# Patient Record
Sex: Female | Born: 1975 | Race: White | Hispanic: No | Marital: Married | Smoking: Never smoker
Health system: Southern US, Community
[De-identification: ages and names within clinical notes are randomized; demographics above are authoritative.]

## PROBLEM LIST (undated history)

## (undated) DIAGNOSIS — G4700 Insomnia, unspecified: Secondary | ICD-10-CM

## (undated) DIAGNOSIS — M1990 Unspecified osteoarthritis, unspecified site: Secondary | ICD-10-CM

## (undated) DIAGNOSIS — R519 Headache, unspecified: Secondary | ICD-10-CM

## (undated) DIAGNOSIS — D649 Anemia, unspecified: Secondary | ICD-10-CM

## (undated) DIAGNOSIS — M5432 Sciatica, left side: Secondary | ICD-10-CM

## (undated) DIAGNOSIS — R011 Cardiac murmur, unspecified: Secondary | ICD-10-CM

## (undated) DIAGNOSIS — R51 Headache: Secondary | ICD-10-CM

## (undated) DIAGNOSIS — R569 Unspecified convulsions: Secondary | ICD-10-CM

## (undated) DIAGNOSIS — M549 Dorsalgia, unspecified: Secondary | ICD-10-CM

## (undated) DIAGNOSIS — K219 Gastro-esophageal reflux disease without esophagitis: Secondary | ICD-10-CM

## (undated) HISTORY — DX: Gastro-esophageal reflux disease without esophagitis: K21.9

## (undated) HISTORY — DX: Sciatica, left side: M54.32

## (undated) HISTORY — DX: Dorsalgia, unspecified: M54.9

## (undated) HISTORY — PX: GANGLION CYST EXCISION: SHX1691

## (undated) HISTORY — PX: TUBAL LIGATION: SHX77

---

## 2011-06-16 ENCOUNTER — Ambulatory Visit (INDEPENDENT_AMBULATORY_CARE_PROVIDER_SITE_OTHER): Payer: 59 | Admitting: Family Medicine

## 2011-06-16 ENCOUNTER — Encounter: Payer: Self-pay | Admitting: Family Medicine

## 2011-06-16 VITALS — BP 120/80 | HR 96 | Temp 98.6°F | Ht 61.0 in | Wt 164.5 lb

## 2011-06-16 DIAGNOSIS — G8929 Other chronic pain: Secondary | ICD-10-CM

## 2011-06-16 DIAGNOSIS — M549 Dorsalgia, unspecified: Secondary | ICD-10-CM

## 2011-06-16 DIAGNOSIS — M5416 Radiculopathy, lumbar region: Secondary | ICD-10-CM | POA: Insufficient documentation

## 2011-06-16 MED ORDER — OXYCODONE-ACETAMINOPHEN 5-325 MG PO TABS
1.0000 | ORAL_TABLET | Freq: Three times a day (TID) | ORAL | Status: AC | PRN
Start: 2011-06-16 — End: 2011-06-26
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
## 2011-09-02 ENCOUNTER — Other Ambulatory Visit: Payer: Self-pay

## 2011-09-02 NOTE — Telephone Encounter (Signed)
Pt request written rx for Oxycodone APAP 5-325 mg for back pain. Pt last seen 06/16/11. Pt can be reached at [phone] when ready for pick up. Pt understands Dr Dayton Martes is not in the office this afternoon.

## 2011-09-03 ENCOUNTER — Other Ambulatory Visit: Payer: Self-pay

## 2011-09-03 MED ORDER — OXYCODONE-ACETAMINOPHEN 5-325 MG PO TABS: 1.0000 | ORAL_TABLET | Freq: Three times a day (TID) | ORAL | Status: DC | PRN

## 2011-09-03 NOTE — Telephone Encounter (Signed)
Pt picked up Roxicet rx #30 today but pt has not gotten filled yet. Pt said usually Dr Dayton Martes prescribes #90 and pt said if Dr Dayton Martes would write Roxicet rx for #90 she would bring the #30 rx back when she picks up new rx. Pt said she does not want to have to pick up a prescription every month. Pt can be reached at [phone].Please advise.

## 2011-09-03 NOTE — Telephone Encounter (Signed)
Rx for 90 printed out.

## 2011-09-03 NOTE — Telephone Encounter (Signed)
New script for # 90 placed up front, pt advised.  She is to bring back old script.

## 2011-09-03 NOTE — Telephone Encounter (Signed)
Left message advising pt script is ready for pick up, script placed up front. 

## 2011-11-26 ENCOUNTER — Other Ambulatory Visit: Payer: Self-pay

## 2011-11-26 MED ORDER — OXYCODONE-ACETAMINOPHEN 5-325 MG PO TABS: 1.0000 | ORAL_TABLET | Freq: Three times a day (TID) | ORAL | Status: DC | PRN

## 2011-11-26 NOTE — Telephone Encounter (Signed)
Left message advising pt script is ready for pick up, script placed up front.

## 2011-11-26 NOTE — Telephone Encounter (Signed)
Pt request 3 month rx for oxycodone. Call when ready for pick up .

## 2011-11-30 ENCOUNTER — Telehealth: Payer: Self-pay

## 2011-11-30 NOTE — Telephone Encounter (Signed)
Pt walked in the oxycodone rx cannot be filled at [facility]; spoke with Corrie Dandy at [facility] did not get shipment of Oxycodone in. Pt is out of med; Corrie Dandy said pt can pick up rx and take to [facility]. [facility] are not working. Pt will take to [facility] to be filled.

## 2012-01-12 ENCOUNTER — Telehealth: Payer: Self-pay

## 2012-01-12 NOTE — Telephone Encounter (Signed)
I would recommend Dramamine (OTC) 50 mg q 4-6 hours as needed. 

## 2012-01-12 NOTE — Telephone Encounter (Signed)
Advised patient

## 2012-01-12 NOTE — Telephone Encounter (Signed)
Pt going on cruise for 5 days request motion sickness pill not patch to [facility].Please advise.

## 2012-02-01 ENCOUNTER — Other Ambulatory Visit: Payer: Self-pay | Admitting: *Deleted

## 2012-02-01 MED ORDER — OXYCODONE-ACETAMINOPHEN 5-325 MG PO TABS: 1.0000 | ORAL_TABLET | Freq: Three times a day (TID) | ORAL | Status: DC | PRN

## 2012-02-01 NOTE — Telephone Encounter (Signed)
Advised patient script is ready for pick up, script placed at front desk. 

## 2012-02-01 NOTE — Telephone Encounter (Signed)
Patient called requesting a refill on oxycodone. Call when ready for pickup.

## 2012-03-24 ENCOUNTER — Encounter: Payer: 59 | Admitting: Family Medicine

## 2012-04-08 ENCOUNTER — Other Ambulatory Visit: Payer: Self-pay | Admitting: Family Medicine

## 2012-04-08 DIAGNOSIS — Z0000 Encounter for general adult medical examination without abnormal findings: Secondary | ICD-10-CM | POA: Insufficient documentation

## 2012-04-15 ENCOUNTER — Other Ambulatory Visit (INDEPENDENT_AMBULATORY_CARE_PROVIDER_SITE_OTHER): Payer: PRIVATE HEALTH INSURANCE

## 2012-04-15 ENCOUNTER — Other Ambulatory Visit: Payer: Self-pay

## 2012-04-15 DIAGNOSIS — Z0000 Encounter for general adult medical examination without abnormal findings: Secondary | ICD-10-CM

## 2012-04-15 LAB — COMPREHENSIVE METABOLIC PANEL
ALT: 14 U/L (ref 0–35)
AST: 18 U/L (ref 0–37)
Alkaline Phosphatase: 51 U/L (ref 39–117)
CO2: 27 mEq/L (ref 19–32)
Creatinine, Ser: 0.7 mg/dL (ref 0.4–1.2)
GFR: 95.03 mL/min (ref >60.00)
Sodium: 136 mEq/L (ref 135–145)
Total Bilirubin: 0.5 mg/dL (ref 0.3–1.2)
Total Protein: 7.6 g/dL (ref 6.0–8.3)

## 2012-04-15 LAB — LDL CHOLESTEROL, DIRECT: Direct LDL: 140.5 mg/dL

## 2012-04-15 LAB — LIPID PANEL
HDL: 37.2 mg/dL — ABNORMAL LOW (ref >39.00)
Total CHOL/HDL Ratio: 5
VLDL: 34.8 mg/dL (ref 0.0–40.0)

## 2012-04-15 MED ORDER — OXYCODONE-ACETAMINOPHEN 5-325 MG PO TABS: 1.0000 | ORAL_TABLET | Freq: Three times a day (TID) | ORAL | Status: DC | PRN

## 2012-04-15 NOTE — Telephone Encounter (Signed)
Advised patient script is ready for pick up. 

## 2012-04-15 NOTE — Telephone Encounter (Signed)
Pt left v/m requesting rx oxycodone; pt only has 2 pills.call when ready for pick up.

## 2012-04-21 ENCOUNTER — Encounter: Payer: Self-pay | Admitting: Family Medicine

## 2012-04-21 ENCOUNTER — Ambulatory Visit (INDEPENDENT_AMBULATORY_CARE_PROVIDER_SITE_OTHER): Payer: PRIVATE HEALTH INSURANCE | Admitting: Family Medicine

## 2012-04-21 VITALS — BP 120/84 | HR 72 | Temp 98.2°F | Ht 61.0 in | Wt 161.0 lb

## 2012-04-21 DIAGNOSIS — G8929 Other chronic pain: Secondary | ICD-10-CM

## 2012-04-21 DIAGNOSIS — M549 Dorsalgia, unspecified: Secondary | ICD-10-CM

## 2012-04-21 DIAGNOSIS — Z0000 Encounter for general adult medical examination without abnormal findings: Secondary | ICD-10-CM

## 2012-04-21 MED ORDER — GABAPENTIN 300 MG PO CAPS: 300.0000 mg | ORAL_CAPSULE | Freq: Three times a day (TID) | ORAL | Status: DC
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
## 2012-04-21 NOTE — Patient Instructions (Addendum)
We are adding Gabapentin- start out with 1 tablet at bedtime, may increase to 3 times daily.  Have a happy holiday.

## 2012-04-27 ENCOUNTER — Other Ambulatory Visit: Payer: Self-pay | Admitting: Family Medicine

## 2012-04-27 ENCOUNTER — Telehealth: Payer: Self-pay | Admitting: Family Medicine

## 2012-04-27 MED ORDER — PREGABALIN 50 MG PO CAPS: 50.0000 mg | ORAL_CAPSULE | Freq: Two times a day (BID) | ORAL | Status: DC

## 2012-04-27 NOTE — Telephone Encounter (Signed)
Pt came to office with her son and talked with Dr. Dayton Martes.

## 2012-04-27 NOTE — Telephone Encounter (Signed)
[facility] Triage Call Report Triage Record Num: [id number] Operator: Albertine Grates Patient Name: Jasmine [name] Call Date & Time: 04/26/2012 5:18:12PM Patient Phone: [phone] PCP: Ruthe Mannan Patient Gender: Female PCP Fax : [phone] Patient DOB: 13-Oct-1975 Practice Name: [facility] Reason for Call: Caller: Aidyn/Patient; PCP: Ruthe Mannan (Family Practice); CB#: [phone]; States has started taking Gabapentin 300mg  twice daily since 11-15. Does not "feel right" 11-19. Has headache and feels "nauseated". Eyes are burning. Afebrile. LMP "3 months ago" due to birth control. Has taken Aleve but has not helped. Rates "8-9" on scale 1-10. States is pounding. Advised ED due to severe headache but refuses. States if headache worsens will be seen in office 11-20. States is not taking any more gabapentin nor is receptive to another medicine to replace. Will follow up with PCP. Protocol(s) Used: Headache Recommended Outcome per Protocol: See Provider within 4 hours Reason for Outcome: New onset of severe headache unrelieved with 4 hours of home care Care Advice: ~ 04/26/2012 5:28:32PM Page 1 of 1 [email or web address]

## 2012-04-27 NOTE — Telephone Encounter (Signed)
Please call pt to check on her

## 2012-06-13 ENCOUNTER — Other Ambulatory Visit: Payer: Self-pay

## 2012-06-13 MED ORDER — OXYCODONE-ACETAMINOPHEN 5-325 MG PO TABS: 1.0000 | ORAL_TABLET | Freq: Three times a day (TID) | ORAL | Status: DC | PRN

## 2012-06-13 NOTE — Telephone Encounter (Signed)
Pt left v/m requesting rx oxycodone. Call when ready for pick up. 

## 2012-06-14 NOTE — Telephone Encounter (Signed)
Left message on home voice mail advising pt that script is ready for pick up.

## 2012-08-05 ENCOUNTER — Other Ambulatory Visit: Payer: Self-pay

## 2012-08-05 MED ORDER — OXYCODONE-ACETAMINOPHEN 5-325 MG PO TABS: 1.0000 | ORAL_TABLET | Freq: Three times a day (TID) | ORAL | Status: DC | PRN

## 2012-08-05 NOTE — Telephone Encounter (Signed)
Pt request rx oxycodone apap.call when rx ready for pick up. Pt does not have enough med to last until 08/312 and request call back today.Please advise.

## 2012-08-05 NOTE — Telephone Encounter (Signed)
Printed  Please give to patient

## 2012-08-05 NOTE — Telephone Encounter (Signed)
Patient advised.  Rx left at front desk for pick up. 

## 2012-09-20 ENCOUNTER — Other Ambulatory Visit: Payer: Self-pay

## 2012-09-20 MED ORDER — OXYCODONE-ACETAMINOPHEN 5-325 MG PO TABS: 1.0000 | ORAL_TABLET | Freq: Three times a day (TID) | ORAL | Status: DC | PRN

## 2012-09-20 NOTE — Telephone Encounter (Signed)
Pt left v/m requesting rx oxycodone apap. Call when ready for pick up. 

## 2012-09-20 NOTE — Telephone Encounter (Signed)
Advised patient script is ready for pick up, placed at front desk. 

## 2012-11-01 ENCOUNTER — Other Ambulatory Visit: Payer: Self-pay

## 2012-11-01 ENCOUNTER — Encounter: Payer: Self-pay | Admitting: Family Medicine

## 2012-11-01 MED ORDER — OXYCODONE-ACETAMINOPHEN 5-325 MG PO TABS: 1.0000 | ORAL_TABLET | Freq: Three times a day (TID) | ORAL | Status: DC | PRN

## 2012-11-01 NOTE — Telephone Encounter (Signed)
Advised patient script is ready for pick up at front desk. 

## 2012-11-01 NOTE — Telephone Encounter (Signed)
Pt left v/m requesting rx oxycodone. Pt is out of med and request pick up rx today.Please advise.

## 2012-11-14 ENCOUNTER — Encounter: Payer: Self-pay | Admitting: Family Medicine

## 2012-12-12 ENCOUNTER — Other Ambulatory Visit: Payer: Self-pay

## 2012-12-12 MED ORDER — OXYCODONE-ACETAMINOPHEN 5-325 MG PO TABS: 1.0000 | ORAL_TABLET | Freq: Three times a day (TID) | ORAL | Status: DC | PRN

## 2012-12-12 NOTE — Telephone Encounter (Signed)
When was it last filled? 

## 2012-12-12 NOTE — Telephone Encounter (Signed)
Advised patient script is ready for pick up. 

## 2012-12-12 NOTE — Telephone Encounter (Signed)
Pt request rx oxycodone apap for back,neck and leg pain. Pt request cb when ready for pickup.

## 2012-12-12 NOTE — Telephone Encounter (Signed)
Looks like 11/01/12.  Script on your desk for signature.

## 2013-01-10 ENCOUNTER — Other Ambulatory Visit: Payer: Self-pay

## 2013-01-10 MED ORDER — OXYCODONE-ACETAMINOPHEN 5-325 MG PO TABS: 1.0000 | ORAL_TABLET | Freq: Three times a day (TID) | ORAL | Status: DC | PRN

## 2013-01-10 NOTE — Telephone Encounter (Signed)
Left voicemail letting pt know Rx ready for pick up 

## 2013-01-10 NOTE — Telephone Encounter (Signed)
Pt left note requesting rx oxycodone apap. Call when ready for pick up. Pt will be leaving on vacation 01/13/13 -01/26/13 and will not have enough med for trip.

## 2013-01-10 NOTE — Telephone Encounter (Signed)
Px printed for pick up in IN box  

## 2013-02-13 ENCOUNTER — Telehealth: Payer: Self-pay

## 2013-02-13 ENCOUNTER — Encounter: Payer: Self-pay | Admitting: Internal Medicine

## 2013-02-13 ENCOUNTER — Ambulatory Visit (INDEPENDENT_AMBULATORY_CARE_PROVIDER_SITE_OTHER): Payer: PRIVATE HEALTH INSURANCE | Admitting: Internal Medicine

## 2013-02-13 VITALS — BP 100/70 | HR 78 | Temp 98.0°F | Wt 159.0 lb

## 2013-02-13 DIAGNOSIS — IMO0002 Reserved for concepts with insufficient information to code with codable children: Secondary | ICD-10-CM

## 2013-02-13 DIAGNOSIS — M792 Neuralgia and neuritis, unspecified: Secondary | ICD-10-CM | POA: Insufficient documentation

## 2013-02-13 MED ORDER — PREDNISONE 20 MG PO TABS: 40.0000 mg | ORAL_TABLET | Freq: Every day | ORAL | Status: DC

## 2013-02-13 MED ORDER — CYCLOBENZAPRINE HCL 10 MG PO TABS: 5.0000 mg | ORAL_TABLET | Freq: Three times a day (TID) | ORAL | Status: DC | PRN

## 2013-02-13 NOTE — Assessment & Plan Note (Signed)
Fortunately no weakness Wants to avoid surgery Will try course of prednisone Cyclobenzaprine (esp for night) May need MRI if worsens

## 2013-02-13 NOTE — Telephone Encounter (Signed)
Will evaluate at [facility] 
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
## 2013-02-13 NOTE — Telephone Encounter (Signed)
Pt said has hx of neck problems; for 2 weeks pt has had pain in neck muscle; this weekend pain has worsened and having pain in rt shoulder with numbness and tingling in rt fingers. Pain level now is 8-9. Pt scheduled appt with Dr Alphonsus Sias today at 12 noon. Dr Dayton Martes had no available appts.

## 2013-02-21 ENCOUNTER — Other Ambulatory Visit: Payer: Self-pay

## 2013-02-21 MED ORDER — OXYCODONE-ACETAMINOPHEN 5-325 MG PO TABS: 1.0000 | ORAL_TABLET | Freq: Three times a day (TID) | ORAL | Status: DC | PRN

## 2013-02-21 NOTE — Telephone Encounter (Signed)
Advised patient script is ready for pick up. 

## 2013-02-21 NOTE — Telephone Encounter (Signed)
Pt request rx oxycodone apap. Call when ready for pick up. 

## 2013-03-13 ENCOUNTER — Encounter: Payer: Self-pay | Admitting: Family Medicine

## 2013-03-13 ENCOUNTER — Ambulatory Visit (INDEPENDENT_AMBULATORY_CARE_PROVIDER_SITE_OTHER): Payer: BC Managed Care – PPO | Admitting: Family Medicine

## 2013-03-13 VITALS — BP 140/94 | HR 79 | Temp 98.0°F | Wt 163.0 lb

## 2013-03-13 DIAGNOSIS — F4323 Adjustment disorder with mixed anxiety and depressed mood: Secondary | ICD-10-CM

## 2013-03-13 DIAGNOSIS — G8929 Other chronic pain: Secondary | ICD-10-CM

## 2013-03-13 DIAGNOSIS — M549 Dorsalgia, unspecified: Secondary | ICD-10-CM

## 2013-03-13 MED ORDER — PREGABALIN 50 MG PO CAPS: 50.0000 mg | ORAL_CAPSULE | Freq: Two times a day (BID) | ORAL | Status: DC
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
## 2013-03-13 NOTE — Patient Instructions (Signed)
Good to see you. Please stop by to see Shirlee Limerick on your way out to discuss an appointment with Terri(social worker/therapist) for you and your daughter.

## 2013-03-14 ENCOUNTER — Telehealth: Payer: Self-pay | Admitting: Family Medicine

## 2013-03-14 MED ORDER — GABAPENTIN 300 MG PO CAPS: 300.0000 mg | ORAL_CAPSULE | Freq: Three times a day (TID) | ORAL | Status: DC

## 2013-03-14 NOTE — Telephone Encounter (Signed)
Yes I would taper off of lyrica- 1 tablet every other day for 2 weeks and stop.  I will refill Gabapentin.

## 2013-03-14 NOTE — Telephone Encounter (Signed)
Pt left message stating that her insurance no longer covers Lyrica and wants to switch back to Gabapentin.  She wants to know if she needs to taper off of the Lyrica or if she can go ahead and stop taking it and start the Gabapentin.

## 2013-03-14 NOTE — Telephone Encounter (Signed)
She can start the gabapentin right away and take a lyrica every other day until she runs outs.

## 2013-03-14 NOTE — Telephone Encounter (Signed)
Pt says she only has 8 pills left (cannot get anymore bc of insurance) and is worried about not having something to take daily.

## 2013-03-14 NOTE — Addendum Note (Signed)
Addended by: Dianne Dun on: 03/14/2013 03:17 PM   Modules accepted: Orders

## 2013-03-15 NOTE — Telephone Encounter (Signed)
Pt says that the last time she took Gabapentin, she was taking 2/day and felt like she was going to have a seizure.  She does not want to take 3/day.  Encouraged pt to try 1/day with the Lyrica every other day and then gradually increase Gabapentin as tolerated.  Instructed pt to call us with any issues.

## 2013-03-15 NOTE — Telephone Encounter (Signed)
Yes I agree with instructions given.  Thank you.

## 2013-03-28 ENCOUNTER — Other Ambulatory Visit: Payer: Self-pay

## 2013-03-28 NOTE — Telephone Encounter (Signed)
Please route to [employer] after approval.

## 2013-03-28 NOTE — Telephone Encounter (Signed)
This needs to be done by one of the [facility] doctors because I am not there to sign it

## 2013-03-28 NOTE — Telephone Encounter (Signed)
Pt left v/m requesting rx oxycodone apap. Call when ready for pick up. 

## 2013-03-30 ENCOUNTER — Encounter: Payer: Self-pay | Admitting: Family Medicine

## 2013-03-30 MED ORDER — OXYCODONE-ACETAMINOPHEN 5-325 MG PO TABS: 1.0000 | ORAL_TABLET | Freq: Three times a day (TID) | ORAL | Status: DC | PRN

## 2013-03-30 NOTE — Addendum Note (Signed)
Addended by: Kerby Nora E on: 03/30/2013 08:36 AM   Modules accepted: Orders

## 2013-03-30 NOTE — Telephone Encounter (Signed)
Can you please sign and print for Dr. Dayton Martes.  Route to [place].  Thanks.

## 2013-03-30 NOTE — Telephone Encounter (Signed)
Pt called and is out of medicine and needs to pick up rx today.

## 2013-03-30 NOTE — Telephone Encounter (Signed)
On printer box.

## 2013-03-30 NOTE — Telephone Encounter (Signed)
[name] notified prescription is ready to be picked up at front desk.

## 2013-04-11 ENCOUNTER — Ambulatory Visit (INDEPENDENT_AMBULATORY_CARE_PROVIDER_SITE_OTHER): Payer: BC Managed Care – PPO | Admitting: Internal Medicine

## 2013-04-11 ENCOUNTER — Encounter: Payer: Self-pay | Admitting: Internal Medicine

## 2013-04-11 ENCOUNTER — Encounter: Payer: PRIVATE HEALTH INSURANCE | Admitting: Family Medicine

## 2013-04-11 VITALS — BP 110/74 | HR 87 | Temp 98.4°F | Ht 61.0 in | Wt 162.5 lb

## 2013-04-11 DIAGNOSIS — N951 Menopausal and female climacteric states: Secondary | ICD-10-CM

## 2013-04-11 DIAGNOSIS — Z13220 Encounter for screening for lipoid disorders: Secondary | ICD-10-CM

## 2013-04-11 DIAGNOSIS — Z23 Encounter for immunization: Secondary | ICD-10-CM

## 2013-04-11 DIAGNOSIS — R232 Flushing: Secondary | ICD-10-CM

## 2013-04-11 DIAGNOSIS — Z0000 Encounter for general adult medical examination without abnormal findings: Secondary | ICD-10-CM

## 2013-04-11 LAB — TSH: TSH: 0.53 u[IU]/mL (ref 0.35–5.50)

## 2013-04-11 LAB — COMPREHENSIVE METABOLIC PANEL
ALT: 20 U/L (ref 0–35)
AST: 24 U/L (ref 0–37)
BUN: 19 mg/dL (ref 6–23)
CO2: 28 mEq/L (ref 19–32)
Calcium: 9.3 mg/dL (ref 8.4–10.5)
Chloride: 100 mEq/L (ref 96–112)
Creatinine, Ser: 0.6 mg/dL (ref 0.4–1.2)
GFR: 110.03 mL/min (ref >60.00)
Sodium: 136 mEq/L (ref 135–145)
Total Protein: 7.5 g/dL (ref 6.0–8.3)

## 2013-04-11 LAB — CBC
Hemoglobin: 12.8 g/dL (ref 12.0–15.0)
MCHC: 33.8 g/dL (ref 30.0–36.0)
RBC: 4.52 Mil/uL (ref 3.87–5.11)
RDW: 13.7 % (ref 11.5–14.6)
WBC: 10.9 10*3/uL — ABNORMAL HIGH (ref 4.5–10.5)

## 2013-04-11 LAB — LIPID PANEL
HDL: 51.1 mg/dL (ref >39.00)
Total CHOL/HDL Ratio: 3
VLDL: 38.4 mg/dL (ref 0.0–40.0)

## 2013-04-11 NOTE — Addendum Note (Signed)
Addended by: Criselda Peaches B on: 04/11/2013 03:10 PM   Modules accepted: Orders
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
## 2013-04-11 NOTE — Patient Instructions (Signed)

## 2013-04-14 ENCOUNTER — Telehealth: Payer: Self-pay

## 2013-04-14 NOTE — Telephone Encounter (Signed)
Pt returning call and request cb. 

## 2013-04-17 ENCOUNTER — Telehealth: Payer: Self-pay | Admitting: Family Medicine

## 2013-04-17 DIAGNOSIS — Z0279 Encounter for issue of other medical certificate: Secondary | ICD-10-CM

## 2013-04-17 NOTE — Telephone Encounter (Signed)
Patient advised that form is ready for pick up. 

## 2013-04-17 NOTE — Telephone Encounter (Signed)
Pt dropped off a physical form for insurances purposes to be completed.

## 2013-04-17 NOTE — Telephone Encounter (Signed)
Spoke with patient about labs 

## 2013-04-17 NOTE — Telephone Encounter (Signed)
Done and given back to [employer]

## 2013-04-17 NOTE — Telephone Encounter (Signed)
Form is in your in box

## 2013-04-18 ENCOUNTER — Other Ambulatory Visit: Payer: Self-pay

## 2013-04-18 NOTE — Telephone Encounter (Signed)
Pt left message requesting refill oxycodone apap and lyrica. Call when ready for pick up.

## 2013-04-21 ENCOUNTER — Other Ambulatory Visit: Payer: Self-pay | Admitting: *Deleted

## 2013-04-21 MED ORDER — PREGABALIN 50 MG PO CAPS: 50.0000 mg | ORAL_CAPSULE | Freq: Two times a day (BID) | ORAL | Status: DC

## 2013-04-21 NOTE — Telephone Encounter (Signed)
Last filled 03/13/2013, ok to fill?

## 2013-04-21 NOTE — Telephone Encounter (Signed)
rx called into pharmacy

## 2013-04-21 NOTE — Telephone Encounter (Signed)
Ok to phone in.

## 2013-05-08 ENCOUNTER — Other Ambulatory Visit: Payer: Self-pay

## 2013-05-08 MED ORDER — OXYCODONE-ACETAMINOPHEN 5-325 MG PO TABS: 1.0000 | ORAL_TABLET | Freq: Three times a day (TID) | ORAL | Status: DC | PRN

## 2013-05-08 NOTE — Telephone Encounter (Signed)
Pt left v/m requesting rx oxycodone apap. Call when ready for pick up. 

## 2013-05-08 NOTE — Telephone Encounter (Signed)
Spoke with patient and advised rx ready for pick-up and it will be at the front desk.  

## 2013-05-22 ENCOUNTER — Other Ambulatory Visit: Payer: Self-pay

## 2013-05-22 MED ORDER — PREGABALIN 50 MG PO CAPS: 50.0000 mg | ORAL_CAPSULE | Freq: Two times a day (BID) | ORAL | Status: DC

## 2013-05-22 NOTE — Telephone Encounter (Signed)
Ok to phone in lyrica

## 2013-05-22 NOTE — Telephone Encounter (Signed)
Pt left v/m requesting lyrica refilled to [facility]. Pt request cb when refilled.

## 2013-05-22 NOTE — Telephone Encounter (Signed)
Rx called into [facility] pharmacy as requested

## 2013-06-05 ENCOUNTER — Telehealth: Payer: Self-pay

## 2013-06-05 ENCOUNTER — Other Ambulatory Visit: Payer: Self-pay

## 2013-06-05 MED ORDER — OXYCODONE-ACETAMINOPHEN 5-325 MG PO TABS: 1.0000 | ORAL_TABLET | Freq: Three times a day (TID) | ORAL | Status: DC | PRN

## 2013-06-05 NOTE — Telephone Encounter (Signed)
Rx left in front office for pick up and pt is aware  

## 2013-06-05 NOTE — Telephone Encounter (Signed)
Pt left v/m requesting rx oxycodone apap. Call when ready for pick up. 

## 2013-06-27 ENCOUNTER — Telehealth: Payer: Self-pay

## 2013-06-27 MED ORDER — OXYCODONE-ACETAMINOPHEN 5-325 MG PO TABS: 1.0000 | ORAL_TABLET | Freq: Three times a day (TID) | ORAL | Status: DC | PRN

## 2013-06-27 MED ORDER — PREGABALIN 50 MG PO CAPS: 50.0000 mg | ORAL_CAPSULE | Freq: Two times a day (BID) | ORAL | Status: DC

## 2013-06-27 NOTE — Telephone Encounter (Signed)
Pt request rx oxycodone apap and Lyrica. Call when ready for pick up. Pt said she only has couple of oxycodone left; pt said she never takes more than 3 a day and usually takes 2 a day. Advised pt last filled 06/05/14; pt said she would start counting when gets from pharmacy to make sure getting correct amt.Please advise.

## 2013-06-27 NOTE — Telephone Encounter (Signed)
Tim Teacher, early years[facility]ac[facility] in ox[facility]one rx; pt continues to get rx early. Pt got oxycodone on 05/08/13, 06/06/13 and now 06/27/13. Tim does not feel comfortable filling rx; wants Dr Elmer SowAron's input,.

## 2013-06-27 NOTE — Telephone Encounter (Signed)
pts daughter had appt today and received Rx at that time

## 2013-06-28 ENCOUNTER — Telehealth: Payer: Self-pay | Admitting: *Deleted

## 2013-06-28 NOTE — Telephone Encounter (Signed)
Agreed and please thank Tim for calling.  Please hold rx until it's time to fill and make note in chart.

## 2013-06-28 NOTE — Telephone Encounter (Signed)
[facility] contacted office in regards to refilling pts Rx for oxycodone. Pt recently had refill on 06/06/13 and he did not feel comfortable filling it at this time. Per Dr Dayton MartesAron, I left a message for Tim, as he is out today, requesting medication be held until refill is due.

## 2013-07-03 ENCOUNTER — Ambulatory Visit (INDEPENDENT_AMBULATORY_CARE_PROVIDER_SITE_OTHER): Payer: BC Managed Care – PPO | Admitting: Internal Medicine

## 2013-07-03 ENCOUNTER — Encounter: Payer: Self-pay | Admitting: Internal Medicine

## 2013-07-03 VITALS — BP 122/76 | HR 86 | Temp 99.1°F | Wt 152.5 lb

## 2013-07-03 DIAGNOSIS — J069 Acute upper respiratory infection, unspecified: Secondary | ICD-10-CM

## 2013-07-03 MED ORDER — AZITHROMYCIN 250 MG PO TABS: ORAL_TABLET | ORAL | Status: DC

## 2013-07-03 NOTE — Progress Notes (Signed)
Pre-visit discussion using our clinic review tool. No additional management support is needed unless otherwise documented below in the visit note.  

## 2013-07-03 NOTE — Patient Instructions (Signed)
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
## 2013-07-07 ENCOUNTER — Encounter: Payer: Self-pay | Admitting: Family Medicine

## 2013-07-07 MED ORDER — OXYCODONE-ACETAMINOPHEN 5-325 MG PO TABS: 1.0000 | ORAL_TABLET | Freq: Three times a day (TID) | ORAL | Status: DC | PRN

## 2013-07-07 NOTE — Telephone Encounter (Signed)
Error

## 2013-07-07 NOTE — Telephone Encounter (Signed)
Spoke to pt and informed her Rx has been reprinted and will be available for pickup at the front desk; informed a gov't issued photo id required for pickup

## 2013-07-07 NOTE — Addendum Note (Signed)
Addended by: Desmond DikeKNIGHT, Smaran Gaus H on: 07/07/2013 10:16 AM   Modules accepted: Orders

## 2013-07-07 NOTE — Telephone Encounter (Signed)
Noted.  Ok to reprint rx and put in my box.

## 2013-07-07 NOTE — Telephone Encounter (Signed)
[facility] called and said that [facility] could not fill her Oxycodone RX a few days ago because it was too early.  She said that they kept the RX and when she called today to have it filled (which wast the date they told her it would be ok to fill), they told her that they did not have the RX and that they had given it back to her.  She said that they didn't give it back to her and now she's gone 4 days without her medication.  I [facility] [place] [facility] and she said that she thinks that Tim misunderstood the message below and discarded the RX.  I told her I wanted to make sure that pt did not have the printed RX and she said that she felt confident that the pt did not have it.

## 2013-07-31 ENCOUNTER — Other Ambulatory Visit: Payer: Self-pay

## 2013-07-31 MED ORDER — PREGABALIN 50 MG PO CAPS: 50.0000 mg | ORAL_CAPSULE | Freq: Two times a day (BID) | ORAL | Status: DC

## 2013-07-31 NOTE — Telephone Encounter (Signed)
Pt request refill lyrica to [facility].Please advise.

## 2013-07-31 NOTE — Telephone Encounter (Signed)
Spoke to pt and informed her Rx called in to requested pharmacy 

## 2013-08-08 ENCOUNTER — Other Ambulatory Visit: Payer: Self-pay

## 2013-08-08 DIAGNOSIS — G894 Chronic pain syndrome: Secondary | ICD-10-CM

## 2013-08-08 NOTE — Telephone Encounter (Signed)
Pt left v/m requesting rx oxycodone apap. Call when ready for pick up. 

## 2013-08-09 ENCOUNTER — Encounter: Payer: Self-pay | Admitting: Family Medicine

## 2013-08-09 ENCOUNTER — Telehealth: Payer: Self-pay | Admitting: Family Medicine

## 2013-08-09 MED ORDER — OXYCODONE-ACETAMINOPHEN 5-325 MG PO TABS: 1.0000 | ORAL_TABLET | Freq: Three times a day (TID) | ORAL | Status: DC | PRN

## 2013-08-09 NOTE — Telephone Encounter (Signed)
It's routine when we prescribe controlled substances to get UDS . Once she gives urine sample, she can pick up rx.  Otherwise, she cannot pick it up.

## 2013-08-09 NOTE — Telephone Encounter (Signed)
Attempted to call pt on call back number- no answer and no voicemail so I was not able to leave a message.

## 2013-08-09 NOTE — Telephone Encounter (Signed)
Spoke to pt who states that she provided a urine sample and received Rx. She states that she "clearly asked for Dr Dayton MartesAron to call her back, not her assistant" and is requesting a call back from you personally. I informed her that you are out of the office this afternoon, and will be seeing pts in the am, and that you would call her back whenever you became available.

## 2013-08-09 NOTE — Telephone Encounter (Signed)
Appears she has been using this much more frequently than we had originally intended.  Ok to refill one time only but I don't manage chronic pain with narcotics long term.  Will refer to pain clinic at this point.  Referral placed.

## 2013-08-09 NOTE — Telephone Encounter (Signed)
Spoke to pt and informed her Rx is available for pickup at the front desk. Pt advised per Dr Dayton MartesAron in regards to future prescriptions. Informed that a gov't issued photo id required for pickup

## 2013-08-09 NOTE — Telephone Encounter (Signed)
Pt is upset b/c she came to pick up her oxycodone Rx and she was lead back to the lab to update her controlled substance contract.  Pt says she didn't have time to do that b/c she had another apptmt to be to, so she have time to do the urine test.  She wants to know why she's being required to do a urine test at this time when this is the last time you are going to fill the Rx.  Can you call pt? She wants to talk to you.

## 2013-08-11 ENCOUNTER — Telehealth: Payer: Self-pay | Admitting: Family Medicine

## 2013-08-11 NOTE — Telephone Encounter (Signed)
Returned patients call and explained why I can no longer refill her narcotics- I do not manage chronic pain.  Referral to pain clinic already placed.  She states that she understands.

## 2013-09-01 ENCOUNTER — Other Ambulatory Visit: Payer: Self-pay

## 2013-09-01 MED ORDER — PREGABALIN 50 MG PO CAPS: 50.0000 mg | ORAL_CAPSULE | Freq: Two times a day (BID) | ORAL | Status: DC

## 2013-09-01 NOTE — Telephone Encounter (Signed)
Spoke to pt and informed her Rx has been called in to requested pharmacy 

## 2013-09-01 NOTE — Telephone Encounter (Signed)
Pt left v/m requesting refill lyrica to [facility].Please advise.

## 2013-09-05 ENCOUNTER — Encounter: Payer: Self-pay | Admitting: Family Medicine

## 2013-10-04 ENCOUNTER — Telehealth: Payer: Self-pay | Admitting: Family Medicine

## 2013-10-04 MED ORDER — DIAZEPAM 2 MG PO TABS: 2.0000 mg | ORAL_TABLET | Freq: Three times a day (TID) | ORAL | Status: DC | PRN

## 2013-10-04 NOTE — Telephone Encounter (Signed)
Dr Dayton MartesAron is out of office for the remainder of the day

## 2013-10-04 NOTE — Telephone Encounter (Signed)
Patient Information:  Caller Name: [name]  Phone: [phone]  Patient: Jasmine [name], Jasmine [name]  Gender: Female  DOB: 03/11/1976  Age: 38 Years  PCP: Ruthe MannanAron, Talia [facility](Family Practice)  Pregnant: No  Office Follow Up:  Does the office need to follow up with this patient?: Yes  Instructions For The Office: Patient is in agreement to try Valium for withdrawal.  She states she has an appt for Friday.  Can she be seen any sooner?  Will this be enough medication to get her through until seen by PCP.  Please advise.  RN Note:  Patient is in agreement to try Valium for withdrawal.  She states she has an appt for Friday.  Can she be seen any sooner?  Will this be enough medication to get her through until seen by PCP.  Please advise.  Symptoms  Reason For Call & Symptoms: Patient is calling to Follow up. She missed a phone call from the office .  It was in regards to medication for withdrawal from oxycodome.  Patient is anxious wanting medication . Reviewed EPIC. It is noted that Dr. Clifton CustardAaron is out of office. Dr. Michell HeinrichGutrerrez is providing patient a short course of Valium until seen by PCP.  She is willing to try medication.  Reviewed Health History In EMR: N/A  Reviewed Medications In EMR: N/A  Reviewed Allergies In EMR: N/A  Reviewed Surgeries / Procedures: N/A  Date of Onset of Symptoms: 10/04/2013 OB / GYN:  LMP: Unknown  Guideline(s) Used:  No Protocol Available - Sick Adult  Disposition Per Guideline:   Discuss with PCP and Callback by Nurse Today  Reason For Disposition Reached:   Nursing judgment  Advice Given:  Call Back If:  New symptoms develop  You become worse.  RN Overrode Recommendation:  Patient Requests Prescription  Patient is in agreement to try Valium for withdrawal.  She states she has an appt for Friday.  Can she be seen any sooner?  Will this be enough medication to get her through until seen by PCP.  Please advise.

## 2013-10-04 NOTE — Telephone Encounter (Signed)
Reviewed chart. plz call pt - notify if desired may try temporary valium course as muscle relaxant and for sleep. Phone in if pt desires.  No pregnancy while on this medication. Offer appt with PCP for tomorrow.

## 2013-10-04 NOTE — Telephone Encounter (Addendum)
That should be plenty of medication to help with spasms and insomnia.  Withdrawal from narcotics is not pleasant but you do not need any medications to get through it like you do with alcohol withdrawal.  Even if she sees me on Friday, I will not add any more or different medication than what she was given.  She could go to ER and tell them she is withdrawing and they can send her to a detox program  There is also an outpatient opiod detox called [facility].

## 2013-10-04 NOTE — Telephone Encounter (Signed)
I will defer to Dr. Reece AgarG and PCP.

## 2013-10-04 NOTE — Telephone Encounter (Signed)
Patient Information:  Caller Name: [name]  Phone: [phone]  Patient: Jasmine [name], Jasmine [name]  Gender: Female  DOB: [birth year]  Age: 38 Years  PCP: Ruthe MannanAron, Talia [facility](Family Practice)  Pregnant: No  Office Follow Up:  Does the office need to follow up with this patient?: Yes  Instructions For The Office: Patient states she has been weaning herself down for the last month. Insurance will not cover pain management. she is wanting to know what she can do for no sleep and constant twitching . PLEASE CONTACT PATIENT. NO APPT AVAILABLE TODAY  RN Note:  Patient states she has been weaning herself down for the last month. Insurance will not cover pain management. she is wanting to know what she can do for no sleep and constant twitching . PLEASE CONTACT PATIENT. NO APPT AVAILABLE TODAY  Symptoms  Reason For Call & Symptoms: Patient states she "just got off from Oxycodone 5/325mg " . she had been on  medication for 2 years and has been weaning off for the last month.  She took her last pill three days ago.  She states twitching ,spasms at night. Described as starting with legs and arms and then whole body.  She has a headache but No n/v/d.Marland Kitchen.  She is not currently shaking but has not slept. Denies Alcohol usage.  She is eating and drinking well.  Reviewed Health History In EMR: Yes  Reviewed Medications In EMR: Yes  Reviewed Allergies In EMR: Yes  Reviewed Surgeries / Procedures: Yes  Date of Onset of Symptoms: 10/01/2013 OB / GYN:  LMP: 10/04/2013  Guideline(s) Used:  Insomnia  Substance Abuse and Dependence  Disposition Per Guideline:   See Today or Tomorrow in Office  Reason For Disposition Reached:   Patient wants to be seen  Advice Given:  N/A  Patient Will Follow Care Advice:  YES

## 2013-10-04 NOTE — Telephone Encounter (Signed)
Message left for patient to return my call on home #. No answer on alternate # and no VM to leave message.

## 2013-10-04 NOTE — Telephone Encounter (Signed)
Patient called back stating she has not heard back and will be leaving and wanted to give alternate contact number of [phone].

## 2013-10-04 NOTE — Telephone Encounter (Signed)
Rx called in as directed. Message left advising patient that there was plenty to last until her appt on Friday with some left over. Advised unfortunately that Dr. Dayton MartesAron was out of the office tomorrow and Dr. Reece AgarG was completely full and is was best to keep her appt with Dr. Dayton MartesAron as scheduled. Advised to call back with any problems.

## 2013-10-05 ENCOUNTER — Other Ambulatory Visit: Payer: Self-pay

## 2013-10-05 NOTE — Telephone Encounter (Signed)
Pt left v/m requesting refill Lyrica to [facility].Please advise.call pt when refilled.

## 2013-10-05 NOTE — Telephone Encounter (Signed)
Spoke to pt and advised per Dr Dayton MartesAron and Pt is VERY unhappy. Pt states that she feels as though Dr Dayton MartesAron, this practice and Dr Elmer SowAron's advise are all shi**y and that she will be finding another provider. I expressed to the pt that we appreciated her allowing us to handle her care, but unfortunately we are not able to meet her detox needs at this location. I offered program information that she may have found helpful, pt but denied/refused and d/c call

## 2013-10-06 ENCOUNTER — Ambulatory Visit: Payer: BC Managed Care – PPO | Admitting: Family Medicine

## 2013-10-06 ENCOUNTER — Encounter: Payer: Self-pay | Admitting: Family Medicine

## 2013-10-06 MED ORDER — PREGABALIN 50 MG PO CAPS: 50.0000 mg | ORAL_CAPSULE | Freq: Two times a day (BID) | ORAL | Status: DC

## 2013-10-06 NOTE — Telephone Encounter (Signed)
Phoned in to pharmacy. 
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
## 2013-10-09 ENCOUNTER — Telehealth: Payer: Self-pay | Admitting: Family Medicine

## 2013-10-09 NOTE — Telephone Encounter (Signed)
Patient dismissed from [facility] by Ruthe Mannanalia Aron MD , effective May 1 , 2015. Dismissal letter sent out by certified / registered mail. DAJ

## 2013-10-31 ENCOUNTER — Other Ambulatory Visit: Payer: Self-pay

## 2013-10-31 MED ORDER — PREGABALIN 50 MG PO CAPS: 50.0000 mg | ORAL_CAPSULE | Freq: Two times a day (BID) | ORAL | Status: DC

## 2013-10-31 NOTE — Telephone Encounter (Signed)
Lm on pts vm informing her Rx has been called in to requested pharmacy 

## 2013-10-31 NOTE — Telephone Encounter (Signed)
She is allowed on one refill (we have to refill meds for 30 days).  No further refills.

## 2013-10-31 NOTE — Telephone Encounter (Signed)
Pt left v/m requesting refill Lyrica to [facility]; saw note that pt was dismissed from Dr Elmer Sow practice on 10/06/13.Please advise.

## 2014-11-12 ENCOUNTER — Telehealth: Payer: Self-pay | Admitting: Family Medicine

## 2014-11-12 DIAGNOSIS — M549 Dorsalgia, unspecified: Principal | ICD-10-CM

## 2014-11-12 DIAGNOSIS — G8929 Other chronic pain: Secondary | ICD-10-CM

## 2014-11-13 ENCOUNTER — Telehealth: Payer: Self-pay | Admitting: Family Medicine

## 2014-11-13 ENCOUNTER — Other Ambulatory Visit: Payer: Self-pay | Admitting: Family Medicine

## 2014-11-13 NOTE — Telephone Encounter (Signed)
Refill sent to pharmacy.   

## 2014-11-13 NOTE — Telephone Encounter (Signed)
RX sent and faxed to pharmacy

## 2014-11-13 NOTE — Telephone Encounter (Signed)
Pt is requesting a refill on Lyrica. She has been out of this medication for a few days now. [phone]

## 2014-12-25 ENCOUNTER — Other Ambulatory Visit: Payer: Self-pay | Admitting: Family Medicine

## 2014-12-25 DIAGNOSIS — M545 Low back pain: Principal | ICD-10-CM

## 2014-12-25 DIAGNOSIS — G8929 Other chronic pain: Secondary | ICD-10-CM

## 2015-03-25 ENCOUNTER — Other Ambulatory Visit: Payer: Self-pay | Admitting: Family Medicine

## 2015-03-27 NOTE — Telephone Encounter (Signed)
Patient refused to make appointment for muscle relaxer and will discuss it with Dr Sherley BoundsSundaram when she comes in for her physical (in which she has not scheduled yet.)

## 2015-04-02 ENCOUNTER — Telehealth: Payer: Self-pay | Admitting: Family Medicine

## 2015-04-02 NOTE — Telephone Encounter (Signed)
Left voice message for patient to return call. °

## 2015-04-02 NOTE — Telephone Encounter (Signed)
Recommend heating pad at area of shoulder where pinched nerve originates, chiropractor evaluation or massage therapy and use ibuprofen up to 800 mg po q8hrs OTC. No prescription medication can be dispensed until evaluation on Friday.

## 2015-04-02 NOTE — Telephone Encounter (Signed)
Pt notified and wants our office to cancel her appt for Friday,stating we cannot do anything for her at this time.

## 2015-04-02 NOTE — Telephone Encounter (Signed)
Patient tried to schedule appointment today however you had nothing available. She scheduled appointment for Friday but really need something today. Patient has pinched nerve in her shoulder blade making arms very painful. Requesting return call to discuss this

## 2015-04-05 ENCOUNTER — Ambulatory Visit: Payer: Self-pay | Admitting: Family Medicine

## 2015-04-10 ENCOUNTER — Telehealth: Payer: Self-pay

## 2015-04-10 ENCOUNTER — Telehealth: Payer: Self-pay | Admitting: Family Medicine

## 2015-04-10 MED ORDER — PREDNISONE 10 MG (21) PO TBPK: 10.0000 mg | ORAL_TABLET | Freq: Every day | ORAL | Status: DC

## 2015-04-10 NOTE — Telephone Encounter (Signed)
A new rx was submitted to Dr. Sherley BoundsSundaram for approval and submission

## 2015-04-10 NOTE — Telephone Encounter (Signed)
I contacted this patient to inform her that a rx for prednisone will be sent in to help her with the pain and that she should f/u with us on Friday.

## 2015-04-10 NOTE — Telephone Encounter (Signed)
Patient has scheduled appointment for this Friday for pinch nerve in her shoulder/arm. She has tried everything that you have suggested including; going to chiropractor, taking ibuprofen, heat and ice pack alternation and nothing seems to work. Would like to know if you could prescribe her something to help relieve the pain until she come in. And when she come in on Friday  would you be willing to give a Cortizone shot.

## 2015-04-10 NOTE — Telephone Encounter (Signed)
I have sent in Prednisone taper pack to her pharmacy. Please convey to patient that I have reviewed previous medical records including telephone conversation notes from her previous provider in the past 1-2 years and it seems that her shoulder and other pain issues are chronic conditions. Therefore I will most likely not be able to perform a shoulder cortisone injection at our visit on Friday but I will discuss with her optimizing her lyrica or other non narcotic medication options as well as discussing referral to pain management, ortho pedist or spinal specialist depending on exam findings.

## 2015-04-10 NOTE — Telephone Encounter (Signed)
Patient was informed that Dr. Sherley BoundsSundaram will discuss treatment options that does not include narcotics or injections, but may could result in a referral based upon her assessment during her office visit on Friday.

## 2015-04-12 ENCOUNTER — Encounter: Payer: Self-pay | Admitting: Family Medicine

## 2015-04-12 ENCOUNTER — Ambulatory Visit (INDEPENDENT_AMBULATORY_CARE_PROVIDER_SITE_OTHER): Payer: BLUE CROSS/BLUE SHIELD | Admitting: Family Medicine

## 2015-04-12 VITALS — BP 128/92 | HR 105 | Temp 97.8°F | Resp 16 | Wt 159.2 lb

## 2015-04-12 DIAGNOSIS — M25511 Pain in right shoulder: Secondary | ICD-10-CM

## 2015-04-12 DIAGNOSIS — G4700 Insomnia, unspecified: Secondary | ICD-10-CM

## 2015-04-12 DIAGNOSIS — F4323 Adjustment disorder with mixed anxiety and depressed mood: Secondary | ICD-10-CM | POA: Diagnosis not present

## 2015-04-12 MED ORDER — CARISOPRODOL 250 MG PO TABS: 350.0000 mg | ORAL_TABLET | Freq: Three times a day (TID) | ORAL | Status: DC | PRN

## 2015-04-12 MED ORDER — OXYCODONE-ACETAMINOPHEN 5-325 MG PO TABS: 1.0000 | ORAL_TABLET | Freq: Three times a day (TID) | ORAL | Status: DC | PRN

## 2015-04-12 MED ORDER — CARISOPRODOL 350 MG PO TABS: 350.0000 mg | ORAL_TABLET | Freq: Three times a day (TID) | ORAL | Status: DC | PRN

## 2015-04-12 MED ORDER — ZOLPIDEM TARTRATE 5 MG PO TABS: 5.0000 mg | ORAL_TABLET | Freq: Every evening | ORAL | Status: DC | PRN
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
## 2015-04-22 ENCOUNTER — Telehealth: Payer: Self-pay | Admitting: Family Medicine

## 2015-04-22 NOTE — Telephone Encounter (Signed)
Put on pain pills and muscle relaxer last week and it is not helping. She also mentioned the cortizone shot but is afraid due to the side effects. Is there anything else that can be done or if you are able to give her another prescription that will last a little longer.

## 2015-04-22 NOTE — Telephone Encounter (Signed)
I think she needs trigger point injections or to go to PT.

## 2015-04-22 NOTE — Telephone Encounter (Signed)
She can come in for an office visit (trigger point injections) or she can be refer to Physical Therapy. It is her choice.

## 2015-04-23 NOTE — Telephone Encounter (Signed)
Spoke with patient and stated that she is afraid of the side effects of the injections and that she is already trying exercises that the chiropractor gave her and nothing is helping. She will call back if need.

## 2015-04-24 ENCOUNTER — Other Ambulatory Visit: Payer: Self-pay | Admitting: Family Medicine

## 2015-04-24 ENCOUNTER — Other Ambulatory Visit: Payer: Self-pay

## 2015-04-24 DIAGNOSIS — G4700 Insomnia, unspecified: Secondary | ICD-10-CM

## 2015-04-24 DIAGNOSIS — M25511 Pain in right shoulder: Secondary | ICD-10-CM

## 2015-04-24 MED ORDER — ZOLPIDEM TARTRATE 5 MG PO TABS: 5.0000 mg | ORAL_TABLET | Freq: Every evening | ORAL | Status: DC | PRN

## 2015-04-24 MED ORDER — OXYCODONE-ACETAMINOPHEN 5-325 MG PO TABS: 1.0000 | ORAL_TABLET | Freq: Three times a day (TID) | ORAL | Status: DC | PRN

## 2015-04-24 MED ORDER — CYCLOBENZAPRINE HCL 5 MG PO TABS: 5.0000 mg | ORAL_TABLET | Freq: Every day | ORAL | Status: DC

## 2015-04-24 NOTE — Telephone Encounter (Signed)
Refill request was sent to Dr. Ashany Sundaram for approval and submission.  

## 2015-04-24 NOTE — Telephone Encounter (Signed)
Patient was informed that her rx for pain meds was ready for pick up. She stated that she was almost out of sleeping pills and muscle relaxer that she has been taking at night only, so she needs a refill.  Patient also said that she was scared to get the injection but if it was going to help her she really wanted to try it. She wanted to know if there was any way she can be worked in this week for the injection.

## 2015-04-24 NOTE — Telephone Encounter (Signed)
Pt states she needs to come in for Cortizone shot. She requested 04/29/15 but Dr Sherley BoundsSundaram is booked. I offered pt an appt on Tues and she states she cannot come in that day. She states she is in a lot of pain and would like to know if she can get a refill on her pain meds if we cannot get in.

## 2015-04-26 ENCOUNTER — Other Ambulatory Visit: Payer: Self-pay | Admitting: Family Medicine

## 2015-04-29 ENCOUNTER — Encounter: Payer: Self-pay | Admitting: Family Medicine

## 2015-04-29 ENCOUNTER — Ambulatory Visit: Payer: BLUE CROSS/BLUE SHIELD | Admitting: Family Medicine

## 2015-04-29 ENCOUNTER — Ambulatory Visit (INDEPENDENT_AMBULATORY_CARE_PROVIDER_SITE_OTHER): Payer: BLUE CROSS/BLUE SHIELD | Admitting: Family Medicine

## 2015-04-29 VITALS — BP 148/90 | HR 92 | Temp 97.1°F | Resp 16 | Wt 162.8 lb

## 2015-04-29 DIAGNOSIS — M25511 Pain in right shoulder: Secondary | ICD-10-CM | POA: Diagnosis not present

## 2015-04-29 MED ORDER — LIDOCAINE HCL (PF) 1 % IJ SOLN: 2.0000 mL | Freq: Once | INTRAMUSCULAR | Status: DC

## 2015-04-29 MED ORDER — TRIAMCINOLONE ACETONIDE 40 MG/ML IJ SUSP: 80.0000 mg | Freq: Once | INTRAMUSCULAR | Status: DC

## 2015-04-29 NOTE — Patient Instructions (Addendum)
This was a follow up appointment from initial encounter on 04/12/2015 due to ongoing pain and medical needs.   Instructions for home care include alternate heat, ice and rotating arm at the shoulder joint. If no improvement in 48hrs please call Dr. Sherley BoundsSundaram for Orthopedic referral.
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
## 2015-05-06 ENCOUNTER — Ambulatory Visit: Payer: BLUE CROSS/BLUE SHIELD | Admitting: Family Medicine

## 2015-05-28 ENCOUNTER — Other Ambulatory Visit: Payer: Self-pay | Admitting: Family Medicine

## 2015-05-29 ENCOUNTER — Ambulatory Visit (INDEPENDENT_AMBULATORY_CARE_PROVIDER_SITE_OTHER): Payer: BLUE CROSS/BLUE SHIELD | Admitting: Family Medicine

## 2015-05-29 ENCOUNTER — Encounter: Payer: Self-pay | Admitting: Family Medicine

## 2015-05-29 VITALS — BP 110/72 | HR 85 | Temp 98.5°F | Resp 16 | Ht 62.0 in | Wt 155.9 lb

## 2015-05-29 DIAGNOSIS — Z0000 Encounter for general adult medical examination without abnormal findings: Secondary | ICD-10-CM | POA: Diagnosis not present
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
## 2015-07-10 ENCOUNTER — Other Ambulatory Visit: Payer: Self-pay | Admitting: Family Medicine

## 2015-07-10 DIAGNOSIS — G4700 Insomnia, unspecified: Secondary | ICD-10-CM

## 2015-07-10 MED ORDER — CARISOPRODOL 350 MG PO TABS: 350.0000 mg | ORAL_TABLET | Freq: Three times a day (TID) | ORAL | Status: DC | PRN

## 2015-07-10 MED ORDER — ZOLPIDEM TARTRATE 5 MG PO TABS: 5.0000 mg | ORAL_TABLET | Freq: Every evening | ORAL | Status: DC | PRN

## 2015-07-10 NOTE — Telephone Encounter (Signed)
Patient is requesting refills on Carisoprodol  and Zolpidem Tartrate . Please send to [facility]

## 2015-08-05 ENCOUNTER — Encounter: Payer: Self-pay | Admitting: Family Medicine

## 2015-08-05 ENCOUNTER — Ambulatory Visit (INDEPENDENT_AMBULATORY_CARE_PROVIDER_SITE_OTHER): Payer: BLUE CROSS/BLUE SHIELD | Admitting: Family Medicine

## 2015-08-05 VITALS — BP 112/68 | HR 92 | Temp 99.1°F | Resp 14 | Ht 62.0 in | Wt 147.0 lb

## 2015-08-05 DIAGNOSIS — M5416 Radiculopathy, lumbar region: Secondary | ICD-10-CM | POA: Diagnosis not present

## 2015-08-05 DIAGNOSIS — M6289 Other specified disorders of muscle: Secondary | ICD-10-CM | POA: Diagnosis not present

## 2015-08-05 DIAGNOSIS — F4323 Adjustment disorder with mixed anxiety and depressed mood: Secondary | ICD-10-CM | POA: Diagnosis not present

## 2015-08-05 DIAGNOSIS — R29898 Other symptoms and signs involving the musculoskeletal system: Secondary | ICD-10-CM

## 2015-08-05 DIAGNOSIS — M25511 Pain in right shoulder: Secondary | ICD-10-CM
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
## 2015-08-06 DIAGNOSIS — R29898 Other symptoms and signs involving the musculoskeletal system: Secondary | ICD-10-CM | POA: Insufficient documentation

## 2015-09-16 ENCOUNTER — Ambulatory Visit (INDEPENDENT_AMBULATORY_CARE_PROVIDER_SITE_OTHER): Payer: BLUE CROSS/BLUE SHIELD | Admitting: Family Medicine

## 2015-09-16 ENCOUNTER — Encounter: Payer: Self-pay | Admitting: Family Medicine

## 2015-09-16 VITALS — BP 110/72 | HR 83 | Temp 98.4°F | Resp 14 | Wt 146.0 lb

## 2015-09-16 DIAGNOSIS — M25512 Pain in left shoulder: Secondary | ICD-10-CM | POA: Diagnosis not present

## 2015-09-16 DIAGNOSIS — M542 Cervicalgia: Secondary | ICD-10-CM

## 2015-09-16 DIAGNOSIS — G4700 Insomnia, unspecified: Secondary | ICD-10-CM | POA: Diagnosis not present

## 2015-09-16 MED ORDER — MELOXICAM 15 MG PO TABS: 15.0000 mg | ORAL_TABLET | Freq: Every day | ORAL | Status: DC

## 2015-09-16 NOTE — Patient Instructions (Addendum)
Try turmeric as a natural anti-inflammatory (for pain and arthritis). It comes in capsules where you buy aspirin and fish oil, but also as a spice where you buy pepper and garlic powder. Try ice or heat, whichever suits you best Topicals like biofreeze may help Tylenol per package directions I do recommend that you decrease your zolpidem dose by 50% every 1-2 weeks and wean yourself off Try melatonin 3 mg at the EXACT same time of night for 3 weeks for sleep regulation I also recommend that you stop using soma because of its addictive potential We'll contact you about the xray results and decide the next step (orthopaedist and/or physical therapy)
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
## 2015-09-17 ENCOUNTER — Ambulatory Visit
Admission: RE | Admit: 2015-09-17 | Discharge: 2015-09-17 | Disposition: A | Discharge status: Home / Self Care | Payer: BLUE CROSS/BLUE SHIELD | Source: Ambulatory Visit | Attending: Family Medicine | Admitting: Family Medicine

## 2015-09-17 ENCOUNTER — Telehealth: Payer: Self-pay | Admitting: Family Medicine

## 2015-09-17 DIAGNOSIS — M50322 Other cervical disc degeneration at C5-C6 level: Secondary | ICD-10-CM | POA: Insufficient documentation

## 2015-09-17 DIAGNOSIS — M4722 Other spondylosis with radiculopathy, cervical region: Secondary | ICD-10-CM | POA: Insufficient documentation

## 2015-09-17 DIAGNOSIS — M542 Cervicalgia: Secondary | ICD-10-CM | POA: Diagnosis present

## 2015-09-17 DIAGNOSIS — M25512 Pain in left shoulder: Secondary | ICD-10-CM | POA: Insufficient documentation

## 2015-09-17 MED ORDER — HYDROXYZINE PAMOATE 25 MG PO CAPS: ORAL_CAPSULE | ORAL | Status: AC

## 2015-09-17 NOTE — Telephone Encounter (Signed)
Called patient notified her of x-ray and gave her appt for MRI.  She states in past has had some anxiety with MRI can she get something?

## 2015-09-17 NOTE — Telephone Encounter (Signed)
Rx sent 

## 2015-09-17 NOTE — Telephone Encounter (Signed)
Let patient know that there is no acute fracture in her neck, but it does show findings (you can read her report if she asks); we'll get MRI of her cervical spine

## 2015-09-18 ENCOUNTER — Encounter: Payer: Self-pay | Admitting: Emergency Medicine

## 2015-09-18 ENCOUNTER — Emergency Department
Admission: EM | Admit: 2015-09-18 | Discharge: 2015-09-18 | Disposition: A | Discharge status: Home / Self Care | Payer: BLUE CROSS/BLUE SHIELD | Attending: Student | Admitting: Student

## 2015-09-18 DIAGNOSIS — M25512 Pain in left shoulder: Secondary | ICD-10-CM | POA: Insufficient documentation

## 2015-09-18 MED ORDER — OXYCODONE-ACETAMINOPHEN 5-325 MG PO TABS
1.0000 | ORAL_TABLET | Freq: Once | ORAL | Status: AC
  Administered 2015-09-18: 1 via ORAL
  Filled 2015-09-18: qty 1

## 2015-09-18 MED ORDER — OXYCODONE-ACETAMINOPHEN 5-325 MG PO TABS: 1.0000 | ORAL_TABLET | ORAL | Status: DC | PRN

## 2015-09-18 MED ORDER — KETOROLAC TROMETHAMINE 60 MG/2ML IM SOLN
60.0000 mg | Freq: Once | INTRAMUSCULAR | Status: AC
  Administered 2015-09-18: 60 mg via INTRAMUSCULAR
  Filled 2015-09-18: qty 2

## 2015-09-18 NOTE — ED Notes (Signed)
NAD noted at time of D/C. Pt denies questions or concerns. Pt ambulatory to the lobby at this time.  

## 2015-09-18 NOTE — ED Notes (Signed)
Pt states she fell off a ladder and hit her L shoulder on 4/7. Pt states saw here PCP on 4/10, had X-rays on 4/11, and has an MRI on 5/2. Per patient no obvious fracture noted on X-ray. Pt presents due to intractable pain in her L shoulder. Pt states decreased ROM, and increased pain with increased movement.
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
## 2015-09-18 NOTE — Discharge Instructions (Signed)
°  You will need to contact your doctor for any further pain medication. Continue your regular medication. Also get your meloxicam filled and began taking it daily.  Percocet 1 every 4 hours if needed for severe pain. Use ice or heat to your muscles to help with pain control and for comfort. A note to remain out of work for 2 days is given today.

## 2015-09-19 ENCOUNTER — Telehealth: Payer: Self-pay | Admitting: Family Medicine

## 2015-09-19 ENCOUNTER — Ambulatory Visit
Admission: RE | Admit: 2015-09-19 | Discharge: 2015-09-19 | Disposition: A | Discharge status: Home / Self Care | Payer: BLUE CROSS/BLUE SHIELD | Source: Ambulatory Visit | Attending: Family Medicine | Admitting: Family Medicine

## 2015-09-19 DIAGNOSIS — M4802 Spinal stenosis, cervical region: Secondary | ICD-10-CM | POA: Insufficient documentation

## 2015-09-19 DIAGNOSIS — M50222 Other cervical disc displacement at C5-C6 level: Secondary | ICD-10-CM | POA: Diagnosis not present

## 2015-09-19 DIAGNOSIS — M50221 Other cervical disc displacement at C4-C5 level: Secondary | ICD-10-CM | POA: Diagnosis not present

## 2015-09-19 DIAGNOSIS — M47812 Spondylosis without myelopathy or radiculopathy, cervical region: Secondary | ICD-10-CM | POA: Diagnosis present

## 2015-09-19 DIAGNOSIS — M9981 Other biomechanical lesions of cervical region: Principal | ICD-10-CM

## 2015-09-19 MED ORDER — OXYCODONE-ACETAMINOPHEN 10-325 MG PO TABS: 1.0000 | ORAL_TABLET | ORAL | Status: DC | PRN

## 2015-09-19 MED ORDER — PREDNISONE 20 MG PO TABS: ORAL_TABLET | ORAL | Status: AC

## 2015-09-19 NOTE — Telephone Encounter (Signed)
I reviewed MRI report; called patient and reviewed findings with her High priority referral to neurosurgeon Discussed reasons, s/s for going back to the ER Prednisone taper which may help; she has taken this before Pain medicine oxycodone/apa 10 which patient can pick up tomorrow before noon I explained that she should NOT take ambien when taking this medicine; Rx to reflect danger of mixing med

## 2015-10-06 NOTE — Assessment & Plan Note (Addendum)
Following fall; will get imaging and treat with turmeric, topicals, tylenol, etc.; MRI if not improving; she has narcotic, soma, plus ambien in her med list, so I am reluctant to prescribe any new controlled substances; cautioned her about risk of unintentional overdose by mixing controlled substances

## 2015-10-06 NOTE — Assessment & Plan Note (Signed)
Imaging, referral to PT if not improving; turmeric, topicals; I am reluctant to prescribe narcotics since she is [place] high dose [place]

## 2015-10-06 NOTE — Assessment & Plan Note (Signed)
Explained my concern about her dose of ambien; advised that women should not take more than 5 mg at night, and I encouraged her to wean down

## 2015-10-08 ENCOUNTER — Ambulatory Visit: Payer: BLUE CROSS/BLUE SHIELD

## 2015-10-09 ENCOUNTER — Other Ambulatory Visit: Payer: Self-pay | Admitting: Neurosurgery

## 2015-10-09 ENCOUNTER — Encounter (HOSPITAL_COMMUNITY): Payer: Self-pay | Admitting: *Deleted

## 2015-10-09 NOTE — Progress Notes (Signed)
Pt denies cardiac history, chest pain or sob. 

## 2015-10-10 ENCOUNTER — Ambulatory Visit (HOSPITAL_COMMUNITY): Payer: BLUE CROSS/BLUE SHIELD | Admitting: Certified Registered"

## 2015-10-10 ENCOUNTER — Ambulatory Visit (HOSPITAL_COMMUNITY): Payer: BLUE CROSS/BLUE SHIELD | Admitting: Anesthesiology

## 2015-10-10 ENCOUNTER — Encounter (HOSPITAL_COMMUNITY)
Admission: RE | Disposition: A | Discharge status: Rehabilitation Facility | Payer: Self-pay | Source: Ambulatory Visit | Attending: Neurosurgery

## 2015-10-10 ENCOUNTER — Encounter (HOSPITAL_COMMUNITY): Payer: Self-pay | Admitting: *Deleted

## 2015-10-10 ENCOUNTER — Ambulatory Visit (HOSPITAL_COMMUNITY): Payer: BLUE CROSS/BLUE SHIELD

## 2015-10-10 ENCOUNTER — Inpatient Hospital Stay (HOSPITAL_COMMUNITY)
Admission: RE | Admit: 2015-10-10 | Discharge: 2015-10-18 | DRG: 473 | Disposition: A | Discharge status: Rehabilitation Facility | Payer: BLUE CROSS/BLUE SHIELD | Source: Ambulatory Visit | Attending: Neurosurgery | Admitting: Neurosurgery

## 2015-10-10 DIAGNOSIS — S14105A Unspecified injury at C5 level of cervical spinal cord, initial encounter: Secondary | ICD-10-CM | POA: Insufficient documentation

## 2015-10-10 DIAGNOSIS — K592 Neurogenic bowel, not elsewhere classified: Secondary | ICD-10-CM | POA: Diagnosis not present

## 2015-10-10 DIAGNOSIS — M5020 Other cervical disc displacement, unspecified cervical region: Secondary | ICD-10-CM | POA: Diagnosis present

## 2015-10-10 DIAGNOSIS — M50021 Cervical disc disorder at C4-C5 level with myelopathy: Secondary | ICD-10-CM | POA: Diagnosis present

## 2015-10-10 DIAGNOSIS — S14109S Unspecified injury at unspecified level of cervical spinal cord, sequela: Secondary | ICD-10-CM | POA: Diagnosis not present

## 2015-10-10 DIAGNOSIS — R29898 Other symptoms and signs involving the musculoskeletal system: Secondary | ICD-10-CM

## 2015-10-10 DIAGNOSIS — R609 Edema, unspecified: Secondary | ICD-10-CM | POA: Diagnosis not present

## 2015-10-10 DIAGNOSIS — N390 Urinary tract infection, site not specified: Secondary | ICD-10-CM | POA: Diagnosis not present

## 2015-10-10 DIAGNOSIS — M4322 Fusion of spine, cervical region: Secondary | ICD-10-CM | POA: Diagnosis present

## 2015-10-10 DIAGNOSIS — N319 Neuromuscular dysfunction of bladder, unspecified: Secondary | ICD-10-CM | POA: Diagnosis not present

## 2015-10-10 DIAGNOSIS — R531 Weakness: Secondary | ICD-10-CM | POA: Diagnosis not present

## 2015-10-10 DIAGNOSIS — G8250 Quadriplegia, unspecified: Secondary | ICD-10-CM | POA: Diagnosis not present

## 2015-10-10 DIAGNOSIS — Z419 Encounter for procedure for purposes other than remedying health state, unspecified: Secondary | ICD-10-CM

## 2015-10-10 DIAGNOSIS — M50022 Cervical disc disorder at C5-C6 level with myelopathy: Secondary | ICD-10-CM | POA: Diagnosis present

## 2015-10-10 DIAGNOSIS — G8918 Other acute postprocedural pain: Secondary | ICD-10-CM | POA: Insufficient documentation

## 2015-10-10 DIAGNOSIS — G8220 Paraplegia, unspecified: Secondary | ICD-10-CM | POA: Diagnosis present

## 2015-10-10 DIAGNOSIS — K210 Gastro-esophageal reflux disease with esophagitis: Secondary | ICD-10-CM | POA: Diagnosis not present

## 2015-10-10 DIAGNOSIS — D72829 Elevated white blood cell count, unspecified: Secondary | ICD-10-CM | POA: Insufficient documentation

## 2015-10-10 DIAGNOSIS — M159 Polyosteoarthritis, unspecified: Secondary | ICD-10-CM | POA: Diagnosis present

## 2015-10-10 DIAGNOSIS — F4321 Adjustment disorder with depressed mood: Secondary | ICD-10-CM | POA: Diagnosis not present

## 2015-10-10 DIAGNOSIS — R0689 Other abnormalities of breathing: Secondary | ICD-10-CM | POA: Insufficient documentation

## 2015-10-10 DIAGNOSIS — M542 Cervicalgia: Secondary | ICD-10-CM | POA: Diagnosis present

## 2015-10-10 DIAGNOSIS — R203 Hyperesthesia: Secondary | ICD-10-CM | POA: Diagnosis not present

## 2015-10-10 DIAGNOSIS — R208 Other disturbances of skin sensation: Secondary | ICD-10-CM | POA: Insufficient documentation

## 2015-10-10 HISTORY — DX: Anemia, unspecified: D64.9

## 2015-10-10 HISTORY — PX: ANTERIOR CERVICAL DECOMP/DISCECTOMY FUSION: SHX1161

## 2015-10-10 HISTORY — DX: Cardiac murmur, unspecified: R01.1

## 2015-10-10 HISTORY — DX: Unspecified convulsions: R56.9

## 2015-10-10 HISTORY — DX: Unspecified osteoarthritis, unspecified site: M19.90

## 2015-10-10 HISTORY — DX: Insomnia, unspecified: G47.00

## 2015-10-10 HISTORY — DX: Headache, unspecified: R51.9

## 2015-10-10 HISTORY — DX: Headache: R51

## 2015-10-10 LAB — CBC
HCT: 39.3 % (ref 36.0–46.0)
Hemoglobin: 12.8 g/dL (ref 12.0–15.0)
MCH: 27.8 pg (ref 26.0–34.0)
MCHC: 32.6 g/dL (ref 30.0–36.0)
MCV: 85.4 fL (ref 78.0–100.0)
PLATELETS: 276 10*3/uL (ref 150–400)
RBC: 4.6 MIL/uL (ref 3.87–5.11)
RDW: 13.2 % (ref 11.5–15.5)
WBC: 11.3 10*3/uL — AB (ref 4.0–10.5)

## 2015-10-10 LAB — SURGICAL PCR SCREEN
MRSA, PCR: NEGATIVE
STAPHYLOCOCCUS AUREUS: NEGATIVE

## 2015-10-10 LAB — HCG, SERUM, QUALITATIVE: PREG SERUM: NEGATIVE

## 2015-10-10 SURGERY — ANTERIOR CERVICAL DECOMPRESSION/DISCECTOMY FUSION 2 LEVELS
Anesthesia: General | Site: Neck

## 2015-10-10 SURGERY — ANTERIOR CERVICAL DECOMPRESSION/DISCECTOMY FUSION 2 LEVEL/HARDWARE REMOVAL
Anesthesia: General | Site: Neck

## 2015-10-10 MED ORDER — LIDOCAINE-EPINEPHRINE 0.5 %-1:200000 IJ SOLN
INTRAMUSCULAR | Status: DC | PRN
  Administered 2015-10-10: 2.5 mL

## 2015-10-10 MED ORDER — FENTANYL CITRATE (PF) 250 MCG/5ML IJ SOLN
INTRAMUSCULAR | Status: AC
  Filled 2015-10-10: qty 5

## 2015-10-10 MED ORDER — DEXAMETHASONE SODIUM PHOSPHATE 10 MG/ML IJ SOLN
INTRAMUSCULAR | Status: AC
  Filled 2015-10-10: qty 1

## 2015-10-10 MED ORDER — PROPOFOL 10 MG/ML IV BOLUS
INTRAVENOUS | Status: DC | PRN
  Administered 2015-10-10: 170 mg via INTRAVENOUS

## 2015-10-10 MED ORDER — SODIUM CHLORIDE 0.9 % IJ SOLN
INTRAMUSCULAR | Status: AC
  Filled 2015-10-10: qty 10

## 2015-10-10 MED ORDER — OXYCODONE-ACETAMINOPHEN 5-325 MG PO TABS
1.0000 | ORAL_TABLET | ORAL | Status: DC | PRN
  Administered 2015-10-11 – 2015-10-12 (×6): 2 via ORAL
  Administered 2015-10-13: 1 via ORAL
  Administered 2015-10-13: 2 via ORAL
  Administered 2015-10-13: 1 via ORAL
  Administered 2015-10-13 – 2015-10-18 (×16): 2 via ORAL
  Filled 2015-10-10 (×19): qty 2
  Filled 2015-10-10: qty 1
  Filled 2015-10-10 (×3): qty 2
  Filled 2015-10-10: qty 1
  Filled 2015-10-10: qty 2

## 2015-10-10 MED ORDER — OXYCODONE HCL 5 MG PO TABS: 5.0000 mg | ORAL_TABLET | Freq: Once | ORAL | Status: DC | PRN

## 2015-10-10 MED ORDER — MIDAZOLAM HCL 2 MG/2ML IJ SOLN
INTRAMUSCULAR | Status: AC
  Filled 2015-10-10: qty 2

## 2015-10-10 MED ORDER — LACTATED RINGERS IV SOLN
INTRAVENOUS | Status: DC | PRN
  Administered 2015-10-10 (×2): via INTRAVENOUS

## 2015-10-10 MED ORDER — CEFAZOLIN SODIUM 1 G IJ SOLR
INTRAMUSCULAR | Status: DC | PRN
  Administered 2015-10-10: 2 g via INTRAMUSCULAR

## 2015-10-10 MED ORDER — SODIUM CHLORIDE 0.9% FLUSH: 3.0000 mL | INTRAVENOUS | Status: DC | PRN

## 2015-10-10 MED ORDER — CEFAZOLIN SODIUM 1 G IJ SOLR
INTRAMUSCULAR | Status: AC
  Filled 2015-10-10: qty 20

## 2015-10-10 MED ORDER — LIDOCAINE 2% (20 MG/ML) 5 ML SYRINGE
INTRAMUSCULAR | Status: AC
  Filled 2015-10-10: qty 5

## 2015-10-10 MED ORDER — SODIUM CHLORIDE 0.9% FLUSH: 3.0000 mL | Freq: Two times a day (BID) | INTRAVENOUS | Status: DC

## 2015-10-10 MED ORDER — ONDANSETRON HCL 4 MG/2ML IJ SOLN
INTRAMUSCULAR | Status: DC | PRN
  Administered 2015-10-10: 4 mg via INTRAVENOUS

## 2015-10-10 MED ORDER — ONDANSETRON HCL 4 MG/2ML IJ SOLN
4.0000 mg | INTRAMUSCULAR | Status: DC | PRN
  Administered 2015-10-11 – 2015-10-17 (×21): 4 mg via INTRAVENOUS
  Filled 2015-10-10 (×22): qty 2

## 2015-10-10 MED ORDER — ONDANSETRON HCL 4 MG/2ML IJ SOLN
4.0000 mg | Freq: Once | INTRAMUSCULAR | Status: AC | PRN
  Administered 2015-10-10: 4 mg via INTRAVENOUS

## 2015-10-10 MED ORDER — THROMBIN 5000 UNITS EX SOLR
CUTANEOUS | Status: DC | PRN
  Administered 2015-10-10 (×2): 5000 [IU] via TOPICAL

## 2015-10-10 MED ORDER — POTASSIUM CHLORIDE IN NACL 20-0.9 MEQ/L-% IV SOLN
INTRAVENOUS | Status: DC
  Administered 2015-10-11: 08:00:00 via INTRAVENOUS
  Administered 2015-10-11 – 2015-10-12 (×2): 1000 mL via INTRAVENOUS
  Administered 2015-10-12 – 2015-10-13 (×2): via INTRAVENOUS
  Administered 2015-10-14: 1000 mL via INTRAVENOUS
  Administered 2015-10-14 – 2015-10-17 (×7): via INTRAVENOUS
  Filled 2015-10-10 (×18): qty 1000

## 2015-10-10 MED ORDER — MIDAZOLAM HCL 5 MG/5ML IJ SOLN
INTRAMUSCULAR | Status: DC | PRN
  Administered 2015-10-10: 2 mg via INTRAVENOUS

## 2015-10-10 MED ORDER — MUPIROCIN 2 % EX OINT
1.0000 "application " | TOPICAL_OINTMENT | Freq: Once | CUTANEOUS | Status: AC
  Administered 2015-10-10: 1 via TOPICAL
  Filled 2015-10-10: qty 22

## 2015-10-10 MED ORDER — PROPOFOL 10 MG/ML IV BOLUS
INTRAVENOUS | Status: AC
  Filled 2015-10-10: qty 20

## 2015-10-10 MED ORDER — HEMOSTATIC AGENTS (NO CHARGE) OPTIME
TOPICAL | Status: DC | PRN
  Administered 2015-10-10: 1 via TOPICAL

## 2015-10-10 MED ORDER — OXYCODONE HCL 5 MG/5ML PO SOLN: 5.0000 mg | Freq: Once | ORAL | Status: DC | PRN

## 2015-10-10 MED ORDER — FENTANYL CITRATE (PF) 100 MCG/2ML IJ SOLN
INTRAMUSCULAR | Status: DC | PRN
  Administered 2015-10-10 (×4): 50 ug via INTRAVENOUS
  Administered 2015-10-10: 150 ug via INTRAVENOUS
  Administered 2015-10-10: 50 ug via INTRAVENOUS
  Administered 2015-10-10: 100 ug via INTRAVENOUS
  Administered 2015-10-10 (×4): 50 ug via INTRAVENOUS

## 2015-10-10 MED ORDER — SUFENTANIL CITRATE 50 MCG/ML IV SOLN
INTRAVENOUS | Status: AC
  Filled 2015-10-10: qty 1

## 2015-10-10 MED ORDER — SUCCINYLCHOLINE CHLORIDE 200 MG/10ML IV SOSY
PREFILLED_SYRINGE | INTRAVENOUS | Status: AC
  Filled 2015-10-10: qty 10

## 2015-10-10 MED ORDER — THROMBIN 5000 UNITS EX SOLR
CUTANEOUS | Status: DC | PRN
  Administered 2015-10-10: 10 mL via TOPICAL

## 2015-10-10 MED ORDER — ONDANSETRON HCL 4 MG/2ML IJ SOLN
INTRAMUSCULAR | Status: AC
  Filled 2015-10-10: qty 2

## 2015-10-10 MED ORDER — LIDOCAINE HCL (CARDIAC) 20 MG/ML IV SOLN
INTRAVENOUS | Status: DC | PRN
  Administered 2015-10-10: 40 mg via INTRAVENOUS

## 2015-10-10 MED ORDER — PHENYLEPHRINE HCL 10 MG/ML IJ SOLN
20.0000 mg | INTRAVENOUS | Status: DC | PRN
  Administered 2015-10-10: 10 ug/min via INTRAVENOUS

## 2015-10-10 MED ORDER — ROCURONIUM BROMIDE 100 MG/10ML IV SOLN
INTRAVENOUS | Status: DC | PRN
  Administered 2015-10-10: 40 mg via INTRAVENOUS
  Administered 2015-10-10: 10 mg via INTRAVENOUS

## 2015-10-10 MED ORDER — SODIUM CHLORIDE 0.9 % IV SOLN: 250.0000 mL | INTRAVENOUS | Status: DC

## 2015-10-10 MED ORDER — DEXAMETHASONE SODIUM PHOSPHATE 10 MG/ML IJ SOLN
6.0000 mg | Freq: Once | INTRAMUSCULAR | Status: AC
  Administered 2015-10-10: 6 mg via INTRAVENOUS

## 2015-10-10 MED ORDER — SUFENTANIL CITRATE 50 MCG/ML IV SOLN
INTRAVENOUS | Status: DC | PRN
  Administered 2015-10-10: 5 ug via INTRAVENOUS

## 2015-10-10 MED ORDER — DEXAMETHASONE SODIUM PHOSPHATE 10 MG/ML IJ SOLN
INTRAMUSCULAR | Status: DC | PRN
  Administered 2015-10-10: 10 mg via INTRAVENOUS

## 2015-10-10 MED ORDER — 0.9 % SODIUM CHLORIDE (POUR BTL) OPTIME
TOPICAL | Status: DC | PRN
  Administered 2015-10-10: 1000 mL

## 2015-10-10 MED ORDER — SUGAMMADEX SODIUM 200 MG/2ML IV SOLN
INTRAVENOUS | Status: DC | PRN
  Administered 2015-10-10: 130 mg via INTRAVENOUS

## 2015-10-10 MED ORDER — LACTATED RINGERS IV SOLN
INTRAVENOUS | Status: DC
  Administered 2015-10-10 (×3): via INTRAVENOUS

## 2015-10-10 MED ORDER — PROPOFOL 10 MG/ML IV BOLUS
INTRAVENOUS | Status: DC | PRN
  Administered 2015-10-10: 150 mg via INTRAVENOUS

## 2015-10-10 MED ORDER — ONDANSETRON HCL 4 MG/2ML IJ SOLN
INTRAMUSCULAR | Status: AC
Start: 2015-10-10 — End: 2015-10-11
  Filled 2015-10-10: qty 2

## 2015-10-10 MED ORDER — LIDOCAINE HCL (CARDIAC) 20 MG/ML IV SOLN
INTRAVENOUS | Status: DC | PRN
  Administered 2015-10-10: 100 mg via INTRATRACHEAL

## 2015-10-10 MED ORDER — HYDROCODONE-ACETAMINOPHEN 5-325 MG PO TABS
1.0000 | ORAL_TABLET | ORAL | Status: DC | PRN
  Administered 2015-10-11: 2 via ORAL
  Administered 2015-10-11: 1 via ORAL
  Administered 2015-10-11: 2 via ORAL
  Filled 2015-10-10 (×2): qty 2
  Filled 2015-10-10: qty 1

## 2015-10-10 MED ORDER — DEXAMETHASONE SODIUM PHOSPHATE 10 MG/ML IJ SOLN
6.0000 mg | Freq: Four times a day (QID) | INTRAMUSCULAR | Status: AC
  Administered 2015-10-11 (×3): 6 mg via INTRAVENOUS
  Filled 2015-10-10 (×3): qty 1

## 2015-10-10 MED ORDER — PHENYLEPHRINE 40 MCG/ML (10ML) SYRINGE FOR IV PUSH (FOR BLOOD PRESSURE SUPPORT)
PREFILLED_SYRINGE | INTRAVENOUS | Status: AC
  Filled 2015-10-10: qty 10

## 2015-10-10 MED ORDER — FENTANYL CITRATE (PF) 100 MCG/2ML IJ SOLN: 25.0000 ug | INTRAMUSCULAR | Status: DC | PRN

## 2015-10-10 MED ORDER — SUCCINYLCHOLINE CHLORIDE 20 MG/ML IJ SOLN
INTRAMUSCULAR | Status: DC | PRN
  Administered 2015-10-10: 100 mg via INTRAVENOUS

## 2015-10-10 SURGICAL SUPPLY — 67 items
BIT DRILL NEURO 2X3.1 SFT TUCH (MISCELLANEOUS) ×1 IMPLANT
BNDG GAUZE ELAST 4 BULKY (GAUZE/BANDAGES/DRESSINGS) ×6 IMPLANT
BONE CERV LORDOTIC 14.5X12X6 (Bone Implant) ×6 IMPLANT
BUR DRUM 4.0 (BURR) ×2 IMPLANT
BUR DRUM 4.0MM (BURR) ×1
CANISTER SUCT 3000ML PPV (MISCELLANEOUS) ×3 IMPLANT
DECANTER SPIKE VIAL GLASS SM (MISCELLANEOUS) ×3 IMPLANT
DRAPE LAPAROTOMY 100X72 PEDS (DRAPES) ×3 IMPLANT
DRAPE MICROSCOPE LEICA (MISCELLANEOUS) ×3 IMPLANT
DRAPE POUCH INSTRU U-SHP 10X18 (DRAPES) ×3 IMPLANT
DRAPE PROXIMA HALF (DRAPES) ×3 IMPLANT
DRILL NEURO 2X3.1 SOFT TOUCH (MISCELLANEOUS) ×3
DURAPREP 6ML APPLICATOR 50/CS (WOUND CARE) ×3 IMPLANT
ELECT COATED BLADE 2.86 ST (ELECTRODE) ×3 IMPLANT
ELECT REM PT RETURN 9FT ADLT (ELECTROSURGICAL) ×3
ELECTRODE REM PT RTRN 9FT ADLT (ELECTROSURGICAL) ×1 IMPLANT
GAUZE SPONGE 4X4 16PLY XRAY LF (GAUZE/BANDAGES/DRESSINGS) IMPLANT
GLOVE BIO SURGEON STRL SZ 6.5 (GLOVE) ×4 IMPLANT
GLOVE BIO SURGEON STRL SZ7 (GLOVE) IMPLANT
GLOVE BIO SURGEON STRL SZ7.5 (GLOVE) IMPLANT
GLOVE BIO SURGEON STRL SZ8 (GLOVE) ×3 IMPLANT
GLOVE BIO SURGEON STRL SZ8.5 (GLOVE) ×3 IMPLANT
GLOVE BIO SURGEONS STRL SZ 6.5 (GLOVE) ×2
GLOVE BIOGEL M 8.0 STRL (GLOVE) IMPLANT
GLOVE BIOGEL PI IND STRL 6.5 (GLOVE) ×2 IMPLANT
GLOVE BIOGEL PI INDICATOR 6.5 (GLOVE) ×4
GLOVE ECLIPSE 6.5 STRL STRAW (GLOVE) ×3 IMPLANT
GLOVE ECLIPSE 7.0 STRL STRAW (GLOVE) IMPLANT
GLOVE ECLIPSE 7.5 STRL STRAW (GLOVE) IMPLANT
GLOVE ECLIPSE 8.0 STRL XLNG CF (GLOVE) IMPLANT
GLOVE ECLIPSE 8.5 STRL (GLOVE) IMPLANT
GLOVE EXAM NITRILE LRG STRL (GLOVE) IMPLANT
GLOVE EXAM NITRILE MD LF STRL (GLOVE) IMPLANT
GLOVE EXAM NITRILE XL STR (GLOVE) IMPLANT
GLOVE EXAM NITRILE XS STR PU (GLOVE) IMPLANT
GLOVE INDICATOR 6.5 STRL GRN (GLOVE) IMPLANT
GLOVE INDICATOR 7.0 STRL GRN (GLOVE) IMPLANT
GLOVE INDICATOR 7.5 STRL GRN (GLOVE) IMPLANT
GLOVE INDICATOR 8.0 STRL GRN (GLOVE) IMPLANT
GLOVE INDICATOR 8.5 STRL (GLOVE) IMPLANT
GLOVE OPTIFIT SS 8.0 STRL (GLOVE) IMPLANT
GLOVE SURG SS PI 6.5 STRL IVOR (GLOVE) ×3 IMPLANT
GOWN STRL REUS W/ TWL LRG LVL3 (GOWN DISPOSABLE) ×3 IMPLANT
GOWN STRL REUS W/ TWL XL LVL3 (GOWN DISPOSABLE) ×1 IMPLANT
GOWN STRL REUS W/TWL 2XL LVL3 (GOWN DISPOSABLE) IMPLANT
GOWN STRL REUS W/TWL LRG LVL3 (GOWN DISPOSABLE) ×6
GOWN STRL REUS W/TWL XL LVL3 (GOWN DISPOSABLE) ×2
KIT BASIN OR (CUSTOM PROCEDURE TRAY) ×3 IMPLANT
KIT ROOM TURNOVER OR (KITS) ×3 IMPLANT
LIQUID BAND (GAUZE/BANDAGES/DRESSINGS) ×3 IMPLANT
NEEDLE HYPO 25X1 1.5 SAFETY (NEEDLE) ×3 IMPLANT
NEEDLE SPNL 22GX3.5 QUINCKE BK (NEEDLE) ×6 IMPLANT
NS IRRIG 1000ML POUR BTL (IV SOLUTION) ×3 IMPLANT
PACK LAMINECTOMY NEURO (CUSTOM PROCEDURE TRAY) ×3 IMPLANT
PAD ARMBOARD 7.5X6 YLW CONV (MISCELLANEOUS) ×9 IMPLANT
PIN DISTRACTION 14MM (PIN) ×6 IMPLANT
PLATE ARCHON 2-LEVEL 36MM (Plate) ×3 IMPLANT
RUBBERBAND STERILE (MISCELLANEOUS) ×6 IMPLANT
SCREW ARCHON ST VAR 4.0X11MM (Screw) ×18 IMPLANT
SPONGE INTESTINAL PEANUT (DISPOSABLE) ×6 IMPLANT
SPONGE SURGIFOAM ABS GEL SZ50 (HEMOSTASIS) ×3 IMPLANT
SUT VIC AB 0 CT1 27 (SUTURE) ×2
SUT VIC AB 0 CT1 27XBRD ANTBC (SUTURE) ×1 IMPLANT
SUT VIC AB 3-0 SH 8-18 (SUTURE) ×3 IMPLANT
TOWEL OR 17X24 6PK STRL BLUE (TOWEL DISPOSABLE) ×3 IMPLANT
TOWEL OR 17X26 10 PK STRL BLUE (TOWEL DISPOSABLE) ×3 IMPLANT
WATER STERILE IRR 1000ML POUR (IV SOLUTION) ×3 IMPLANT

## 2015-10-10 SURGICAL SUPPLY — 63 items
BIT DRILL NEURO 2X3.1 SFT TUCH (MISCELLANEOUS) ×1 IMPLANT
BNDG GAUZE ELAST 4 BULKY (GAUZE/BANDAGES/DRESSINGS) IMPLANT
BUR DRUM 4.0 (BURR) IMPLANT
BUR DRUM 4.0MM (BURR)
CANISTER SUCT 3000ML PPV (MISCELLANEOUS) ×3 IMPLANT
DECANTER SPIKE VIAL GLASS SM (MISCELLANEOUS) ×3 IMPLANT
DRAPE LAPAROTOMY 100X72 PEDS (DRAPES) ×3 IMPLANT
DRAPE MICROSCOPE LEICA (MISCELLANEOUS) ×3 IMPLANT
DRAPE POUCH INSTRU U-SHP 10X18 (DRAPES) ×3 IMPLANT
DRAPE PROXIMA HALF (DRAPES) IMPLANT
DRILL NEURO 2X3.1 SOFT TOUCH (MISCELLANEOUS) ×3
DRSG OPSITE POSTOP 3X4 (GAUZE/BANDAGES/DRESSINGS) ×3 IMPLANT
DURAPREP 6ML APPLICATOR 50/CS (WOUND CARE) ×3 IMPLANT
ELECT COATED BLADE 2.86 ST (ELECTRODE) ×3 IMPLANT
ELECT REM PT RETURN 9FT ADLT (ELECTROSURGICAL) ×3
ELECTRODE REM PT RTRN 9FT ADLT (ELECTROSURGICAL) ×1 IMPLANT
GAUZE SPONGE 4X4 16PLY XRAY LF (GAUZE/BANDAGES/DRESSINGS) IMPLANT
GLOVE BIO SURGEON STRL SZ 6.5 (GLOVE) IMPLANT
GLOVE BIO SURGEON STRL SZ7 (GLOVE) IMPLANT
GLOVE BIO SURGEON STRL SZ7.5 (GLOVE) IMPLANT
GLOVE BIO SURGEON STRL SZ8 (GLOVE) IMPLANT
GLOVE BIO SURGEON STRL SZ8.5 (GLOVE) IMPLANT
GLOVE BIO SURGEONS STRL SZ 6.5 (GLOVE)
GLOVE BIOGEL M 8.0 STRL (GLOVE) IMPLANT
GLOVE ECLIPSE 6.5 STRL STRAW (GLOVE) ×3 IMPLANT
GLOVE ECLIPSE 7.0 STRL STRAW (GLOVE) IMPLANT
GLOVE ECLIPSE 7.5 STRL STRAW (GLOVE) IMPLANT
GLOVE ECLIPSE 8.0 STRL XLNG CF (GLOVE) IMPLANT
GLOVE ECLIPSE 8.5 STRL (GLOVE) IMPLANT
GLOVE EXAM NITRILE LRG STRL (GLOVE) IMPLANT
GLOVE EXAM NITRILE MD LF STRL (GLOVE) IMPLANT
GLOVE EXAM NITRILE XL STR (GLOVE) IMPLANT
GLOVE EXAM NITRILE XS STR PU (GLOVE) IMPLANT
GLOVE INDICATOR 6.5 STRL GRN (GLOVE) IMPLANT
GLOVE INDICATOR 7.0 STRL GRN (GLOVE) IMPLANT
GLOVE INDICATOR 7.5 STRL GRN (GLOVE) IMPLANT
GLOVE INDICATOR 8.0 STRL GRN (GLOVE) IMPLANT
GLOVE INDICATOR 8.5 STRL (GLOVE) IMPLANT
GLOVE OPTIFIT SS 8.0 STRL (GLOVE) IMPLANT
GLOVE SURG SS PI 6.5 STRL IVOR (GLOVE) IMPLANT
GOWN STRL REUS W/ TWL LRG LVL3 (GOWN DISPOSABLE) ×2 IMPLANT
GOWN STRL REUS W/ TWL XL LVL3 (GOWN DISPOSABLE) IMPLANT
GOWN STRL REUS W/TWL 2XL LVL3 (GOWN DISPOSABLE) IMPLANT
GOWN STRL REUS W/TWL LRG LVL3 (GOWN DISPOSABLE) ×4
GOWN STRL REUS W/TWL XL LVL3 (GOWN DISPOSABLE)
KIT BASIN OR (CUSTOM PROCEDURE TRAY) ×3 IMPLANT
KIT ROOM TURNOVER OR (KITS) ×3 IMPLANT
LIQUID BAND (GAUZE/BANDAGES/DRESSINGS) ×3 IMPLANT
NEEDLE HYPO 25X1 1.5 SAFETY (NEEDLE) ×3 IMPLANT
NEEDLE SPNL 22GX3.5 QUINCKE BK (NEEDLE) ×3 IMPLANT
NS IRRIG 1000ML POUR BTL (IV SOLUTION) ×3 IMPLANT
PACK LAMINECTOMY NEURO (CUSTOM PROCEDURE TRAY) ×3 IMPLANT
PAD ARMBOARD 7.5X6 YLW CONV (MISCELLANEOUS) ×9 IMPLANT
PIN DISTRACTION 14MM (PIN) ×6 IMPLANT
RUBBERBAND STERILE (MISCELLANEOUS) ×6 IMPLANT
SPONGE INTESTINAL PEANUT (DISPOSABLE) ×3 IMPLANT
SPONGE SURGIFOAM ABS GEL SZ50 (HEMOSTASIS) ×3 IMPLANT
SUT VIC AB 0 CT1 27 (SUTURE)
SUT VIC AB 0 CT1 27XBRD ANTBC (SUTURE) IMPLANT
SUT VIC AB 3-0 SH 8-18 (SUTURE) ×3 IMPLANT
TOWEL OR 17X24 6PK STRL BLUE (TOWEL DISPOSABLE) ×3 IMPLANT
TOWEL OR 17X26 10 PK STRL BLUE (TOWEL DISPOSABLE) ×3 IMPLANT
WATER STERILE IRR 1000ML POUR (IV SOLUTION) ×3 IMPLANT

## 2015-10-10 NOTE — Progress Notes (Signed)
Transporting patient to MRI per Dr Franky Machoabbell order.

## 2015-10-10 NOTE — Anesthesia Procedure Notes (Signed)
Procedure Name: Intubation Date/Time: 10/10/2015 10:21 PM Performed by: Melina SchoolsBANKS, Nedda Gains J Pre-anesthesia Checklist: Patient identified, Emergency Drugs available, Suction available, Patient being monitored and Timeout performed Patient Re-evaluated:Patient Re-evaluated prior to inductionOxygen Delivery Method: Circle system utilized Preoxygenation: Pre-oxygenation with 100% oxygen Intubation Type: IV induction, Rapid sequence and Cricoid Pressure applied Ventilation: Mask ventilation without difficulty Laryngoscope Size: Mac and 3 Grade View: Grade III Tube type: Oral Tube size: 7.5 mm Number of attempts: 1 Airway Equipment and Method: Stylet Placement Confirmation: ETT inserted through vocal cords under direct vision,  positive ETCO2 and breath sounds checked- equal and bilateral Secured at: 21 cm Tube secured with: Tape Dental Injury: Teeth and Oropharynx as per pre-operative assessment

## 2015-10-10 NOTE — Transfer of Care (Signed)
Immediate Anesthesia Transfer of Care Note  Patient: Jasmine EddyBrenda Karis  Procedure(s) Performed: Procedure(s): Cervical four-five, Cervical five-six Anterior cervical decompression/diskectomy/fusion (N/A)  Patient Location: PACU  Anesthesia Type:General  Level of Consciousness: awake, alert , oriented and patient cooperative  Airway & Oxygen Therapy: Patient Spontanous Breathing and Patient connected to nasal cannula oxygen  Post-op Assessment: Report given to RN, Post -op Vital signs reviewed and stable and Patient moving all extremities X 4  Post vital signs: Reviewed and stable  Last Vitals:  Filed Vitals:   10/10/15 1425 10/10/15 2110  BP: 142/84   Pulse: 85 85  Temp: 36.9 C 36.5 C  Resp: 18 13    Last Pain:  Filed Vitals:   10/10/15 2110  PainSc: 8       Patients Stated Pain Goal: 3 (10/10/15 1425)  Complications: No apparent anesthesia complications

## 2015-10-10 NOTE — Anesthesia Preprocedure Evaluation (Signed)
Anesthesia Evaluation  Patient identified by MRN, date of birth, ID band Patient awake    Reviewed: Allergy & Precautions, NPO status , Patient's Chart, lab work & pertinent test results  Airway Mallampati: II  TM Distance: >3 FB Neck ROM: Full    Dental  (+) Teeth Intact, Dental Advisory Given   Pulmonary    breath sounds clear to auscultation       Cardiovascular  Rhythm:Regular Rate:Normal     Neuro/Psych    GI/Hepatic   Endo/Other    Renal/GU      Musculoskeletal   Abdominal   Peds  Hematology   Anesthesia Other Findings   Reproductive/Obstetrics                             Anesthesia Physical Anesthesia Plan Anesthesia Quick Evaluation

## 2015-10-10 NOTE — Progress Notes (Signed)
Patient ID: Jasmine [name], female   DOB: 09-16-1975, 40 y.o.   MRN: [id number] [name] is unable to move her lower extremities voluntarily post op. She is weak in the wrist extensors. Her neck is soft, speaking voice is strong. She does have sensation in the lower extremities bilaterally. Due to her exam I will return to the operating room immediately to explore the neck to ensure that there is not a hematoma causing this problem. I have spoken with [name] and her husband to inform them of the situation and the current plan.

## 2015-10-10 NOTE — Anesthesia Preprocedure Evaluation (Signed)
Anesthesia Evaluation  Patient identified by MRN, date of birth, ID band Patient awake    Reviewed: Allergy & Precautions, NPO status , Patient's Chart, lab work & pertinent test results  Airway Mallampati: II  TM Distance: >3 FB Neck ROM: Full    Dental  (+) Teeth Intact, Dental Advisory Given   Pulmonary neg pulmonary ROS,    breath sounds clear to auscultation       Cardiovascular negative cardio ROS   Rhythm:Regular Rate:Normal     Neuro/Psych Bring back for inability to move legs post- ACDF.    GI/Hepatic Neg liver ROS, GERD  ,  Endo/Other  negative endocrine ROS  Renal/GU negative Renal ROS     Musculoskeletal   Abdominal   Peds  Hematology negative hematology ROS (+)   Anesthesia Other Findings   Reproductive/Obstetrics                             Lab Results  Component Value Date   WBC 11.3* 10/10/2015   HGB 12.8 10/10/2015   HCT 39.3 10/10/2015   MCV 85.4 10/10/2015   PLT 276 10/10/2015   Lab Results  Component Value Date   CREATININE 0.6 04/11/2013   BUN 19 04/11/2013   NA 136 04/11/2013   K 3.6 04/11/2013   CL 100 04/11/2013   CO2 28 04/11/2013    Anesthesia Physical  Anesthesia Plan  ASA: III and emergent  Anesthesia Plan: General   Post-op Pain Management:    Induction: Intravenous and Rapid sequence  Airway Management Planned: Oral ETT  Additional Equipment:   Intra-op Plan:   Post-operative Plan: Extubation in OR  Informed Consent: I have reviewed the patients History and Physical, chart, labs and discussed the procedure including the risks, benefits and alternatives for the proposed anesthesia with the patient or authorized representative who has indicated his/her understanding and acceptance.   Dental advisory given  Plan Discussed with: CRNA  Anesthesia Plan Comments:         Anesthesia Quick Evaluation

## 2015-10-10 NOTE — Op Note (Signed)
10/10/2015  9:24 PM  PATIENT:  Jasmine EddyBrenda [name]  40 y.o. 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 was taken to the operating room, intubated, and placed under general anesthesia without difficulty. She was positioned supine with her head in slight extension on a horseshoe headrest. The neck was prepped and draped in a sterile manner. I infiltrated 4 cc's 1/2%lidocaine/1:200,000 strength epinephrine into the planned incision starting from the midline to the medial border of the left sternocleidomastoid muscle. I opened the incision with a 10 blade and dissected sharply through soft tissue to the platysma. I dissected in the plane superior to the platysma both rostrally and caudally. I then opened the platysma in a horizontal fashion with Metzenbaum scissors, and dissected in the inferior plane rostrally and caudally. With both blunt and sharp technique I created an avascular corridor to the cervical spine. I placed a spinal needle(s) in the disc space at C4/5 . I then reflected the longus colli from C4 to C6 and placed self retaining retractors. I opened the disc space(s) at 4/5, and 5/6 with a 15 blade. I removed disc with curettes, Kerrison punches, and the drill. Using the drill we removed osteophytes and prepared for the decompression.  We decompressed the spinal canal and the C5,and 6  root(s) with the drill, Kerrison punches, and the curettes. We used the microscope to aid in microdissection. I removed the posterior longitudinal ligament to fully expose and decompress the thecal sac. We exposed the roots laterally taking down the 4/5, and 5/6 uncovertebral joints. With the decompression complete I moved on to the arthrodesis. I used the drill to level the surfaces of C4,5,and 6. I removed soft tissue to prepare the disc space and the bony surfaces. I measured the space and placed a 6mm structural allograft into the disc spaces.  I then placed the anterior instrumentation. I placed 2 screws in each vertebral body through the plate. I locked the screws into place. Intraoperative xray showed the graft, plate, and screws to be in good position. I irrigated the wound, achieved hemostasis, and closed the wound in layers. I approximated the platysma, and the subcuticular plane with vicryl sutures. I used Dermabond for a sterile dressing.   PLAN OF CARE: Admit for overnight observation  PATIENT DISPOSITION:  PACU - hemodynamically stable.   Delay start of Pharmacological VTE agent (>24hrs) due to surgical blood loss or risk of bleeding:  yes

## 2015-10-10 NOTE — Transfer of Care (Signed)
Immediate Anesthesia Transfer of Care Note  Patient: Jasmine [name]  Procedure(s) Performed: Procedure(s) with comments: BRING BACK ANTERIOR CERVICAL DECOMPRESSION/DISCECTOMY FUSION (N/A) - BRING BACK ANTERIOR CERVICAL DECOMPRESSION/DISCECTOMY FUSION  Patient Location: ICU  Anesthesia Type:General  Level of Consciousness: oriented, sedated, patient cooperative and responds to stimulation  Airway & Oxygen Therapy: Patient Spontanous Breathing and Patient connected to nasal cannula oxygen  Post-op Assessment: Report given to RN, Post -op Vital signs reviewed and stable and Moving upper extremities  Post vital signs: Reviewed and stable  Last Vitals:  Filed Vitals:   10/10/15 2130 10/10/15 2145  BP: 103/63   Pulse: 78 82  Temp:    Resp: 15 11    Last Pain:  Filed Vitals:   10/10/15 2333  PainSc: Asleep      Patients Stated Pain Goal: 3 (10/10/15 1425)  Complications: No apparent anesthesia complications

## 2015-10-10 NOTE — Anesthesia Postprocedure Evaluation (Signed)
Anesthesia Post Note  Patient: Jasmine [name]  Procedure(s) Performed: Procedure(s) (LRB): Cervical four-five, Cervical five-six Anterior cervical decompression/diskectomy/fusion (N/A)  Patient location during evaluation: PACU Anesthesia Type: General Level of consciousness: awake and alert Pain management: pain level controlled Vital Signs Assessment: post-procedure vital signs reviewed and stable Respiratory status: spontaneous breathing, nonlabored ventilation, respiratory function stable and patient connected to nasal cannula oxygen Cardiovascular status: blood pressure returned to baseline and stable : Pt unable to move legs in PACU. To OR for re-exploration. Anesthetic complications: no    Last Vitals:  Filed Vitals:   10/10/15 2130 10/10/15 2145  BP: 103/63   Pulse: 78 82  Temp:    Resp: 15 11    Last Pain:  Filed Vitals:   10/10/15 2333  PainSc: Asleep                 Kennieth RadFitzgerald, Hady Niemczyk E

## 2015-10-10 NOTE — Progress Notes (Signed)
Dr Franky Machoabbell at bedside with patient. Unable to voluntarily move BLE. Complains of tinging in all extremities, upper and lower. Able to slightly squeeze upper extremities, very weak. Will proceed as Dr Franky Machoabbell orders.

## 2015-10-10 NOTE — Progress Notes (Signed)
Patient returned to Neuro OR room 31 from MRI. Transported on stretcher- VSS- still unable to move BLE and tingling all over.

## 2015-10-10 NOTE — Anesthesia Procedure Notes (Signed)
Procedure Name: Intubation Date/Time: 10/10/2015 5:58 PM Performed by: Rosiland OzMEYERS, Nikiesha Milford Pre-anesthesia Checklist: Patient identified, Timeout performed, Emergency Drugs available, Suction available and Patient being monitored Patient Re-evaluated:Patient Re-evaluated prior to inductionOxygen Delivery Method: Circle system utilized Preoxygenation: Pre-oxygenation with 100% oxygen Intubation Type: IV induction Ventilation: Mask ventilation without difficulty Laryngoscope Size: Glidescope and 3 Grade View: Grade I Tube type: Oral Tube size: 7.0 mm Number of attempts: 1 Airway Equipment and Method: Stylet Placement Confirmation: ETT inserted through vocal cords under direct vision,  breath sounds checked- equal and bilateral and positive ETCO2 Secured at: 21 cm Tube secured with: Tape Dental Injury: Teeth and Oropharynx as per pre-operative assessment

## 2015-10-10 NOTE — Op Note (Signed)
10/10/2015  11:43 PM  PATIENT:  Jasmine [name]  40 y.o. female whom awakened after a 2 level ACDF at C4/5,5/6 unable to move her lower extremities, impaired sensation below ~T1, burning pain over body. I recommended she return to the OR for emergent neck exploration.  PRE-OPERATIVE DIAGNOSIS:  Post-anterior cervical decompression, lower extremity weakness  POST-OPERATIVE DIAGNOSIS:  Post-anterior cervical decompression, lower extremity weakness  PROCEDURE:  Procedure(s): BRING BACK ANTERIOR CERVICAL DECOMPRESSION/DISCECTOMY FUSION Neck exploration SURGEON: Surgeon(s): Coletta MemosKyle Terena Bohan, MD  ASSISTANTS:none  ANESTHESIA:   general  EBL:  Total I/O In: 1600 [I.V.:1600] Out: 220 [Blood:220]  BLOOD ADMINISTERED:none  COUNT:per nursing  DRAINS: none   SPECIMEN:  No Specimen  DICTATION: Jasmine [name] was taken to the operating room, intubated, and placed under a general anesthetic without difficulty. female Her neck was prepped and draped, the liquiband was removed prior to her neck being prepped.  I opened the incision with a 15 blade. I opened the platysma with a 15 blade. There was no hematoma in the wound. I then removed the screws, plate, and bone plugs to inspect the spinal canal. There was no organized hematoma present, nor liquified hematoma. I inspected the disc spaces, and used a right angle hook to palpate underneath the bony edges. I did not appreciate any mass underneath C4,5, or C6. I irrigated the wound thoroughly. I ensured hemostasis prior to replacing the bone plugs, plate and screws. Final xray revealed the hardware to be in good position. I then closed the wound approximating the platysma, and subcuticular layers with vicryl sutures. I applied a sterile dressing. [name] was extubated and remained without movement in the lower extremities immediately after extubation.   PLAN OF CARE: Admit to inpatient   PATIENT DISPOSITION:  PACU - hemodynamically stable.    Delay start of Pharmacological VTE agent (>24hrs) due to surgical blood loss or risk of bleeding:  yes

## 2015-10-10 NOTE — H&P (Signed)
BP 142/84 mmHg  Pulse 85  Temp(Src) 98.4 F (36.9 C) (Oral)  Resp 18  Ht  (1.549 m)  Wt 64.864 kg (143 lb)  BMI 27.03 kg/m2  SpO2 99%  LMP 09/19/2015     [name] comes in today for evaluation of severe pain that she has in her neck and the left upper extremity.  She was in her usual state of health until she fell off a ladder 09/13/2015.  The pain was so intense that she did go see her primary care physician to give her medication, but then she had to go back to the ED, because the pain was just too great.  She had been on a Prednisone dosepack which she says does give her some relief of her discomfort.  She has not been able to work for the last four to five days.  She did get an injection of Toradol when she went to the emergency department, and that was able to essentially get her home in some semblance of relief.  She is right handed. She works as a Conservation officer, nature.        REVIEW OF SYSTEMS:                        She describes under review of systems, neck pain, back pain, arm pain, and leg pain.  She has a long history of nine years of back and left lower extremity pain, this has not been helped by anything.  She takes Lyrica for that and has not seen any improvement.  She has also taken antiinflammatories in the past.      She had had some pain in the right upper extremity about 5 months ago which subsided and improved, and she was doing well again until the fall off the ladder.     She says nothing has relieved the pain, burning and numbness in the left shoulder blade, weakness in the left upper extremity.        PAST SURGICAL HISTORY:                  She has undergone a tubal ligation and had a hand surgery for a ganglion cyst.      CURRENT MEDICATIONS:                     She is taking Lyrica, Sprintec, a muscle relaxer, Zolpidem, Percocet, Prednisone, and Turmeric.     FAMILY HISTORY:                                Mother is age 24 in fair health.  Father is age  48 and is in poor health, he has breathing problems.  Arthritis and hypertension present in the family history.            SOCIAL HISTORY:                                She does drink alcohol socially.  She has no history of illicit drug use.  She does not smoke.     PHYSICAL EXAMINATION:        [name] on exam is alert, oriented x4, in obvious distress.  She has 5/5 strength in the right upper and both lower extremities, some weakness in the left biceps, some weakness in the  left grip, maybe 5-/5. She has normal muscle tone and bulk, coordination is normal.  Speech is clear.  It is also fluent.  She is hyperreflexic, but states that she has been so ever since she can remember having reflex especially, but she is certainly 3+ at the biceps, triceps, brachioradialis, knees, and ankles.  She has super patellae jerks, and cross abductor response.  Proprioception is intact.  She does have Hoffman's signs bilaterally.  No clonus.  Gait is otherwise normal.  Romberg is negative.      IMAGING STUDIES:                              MRI shows cord signal at the disc space at 5-6, cord compression, a large osteophyte/disc at that level, bilateral foraminal compromise.  She also has some slight compression at 4-5, her foramina are for the most part patent.     IMPRESSION/PLAN:                             I do believe without question that it is the 5-6 level that causing her severe problems at this point in time, the shoulder pain, the hand pain, and the biceps weakness.  I proposed doing an artificial disc at 5-6.  If her insurance would not cover that, then I think she would need a two level arthrodesis of 4-5 and the 5-6.  We will try to get this approved as soon as we can.  She is weak, she has cord signal, and there is no reasonable alternative to treatment without surgical  decompression of the spinal canal at this level.

## 2015-10-11 ENCOUNTER — Inpatient Hospital Stay (HOSPITAL_COMMUNITY): Payer: BLUE CROSS/BLUE SHIELD

## 2015-10-11 ENCOUNTER — Encounter (HOSPITAL_COMMUNITY): Payer: Self-pay | Admitting: Neurosurgery

## 2015-10-11 MED ORDER — ACETAMINOPHEN 325 MG PO TABS: 650.0000 mg | ORAL_TABLET | ORAL | Status: DC | PRN

## 2015-10-11 MED ORDER — PHENOL 1.4 % MT LIQD: 1.0000 | OROMUCOSAL | Status: DC | PRN

## 2015-10-11 MED ORDER — DIAZEPAM 5 MG PO TABS
5.0000 mg | ORAL_TABLET | Freq: Four times a day (QID) | ORAL | Status: DC | PRN
  Administered 2015-10-12 – 2015-10-18 (×12): 5 mg via ORAL
  Filled 2015-10-11 (×13): qty 1

## 2015-10-11 MED ORDER — DIAZEPAM 5 MG/ML IJ SOLN
5.0000 mg | Freq: Four times a day (QID) | INTRAMUSCULAR | Status: DC | PRN
  Administered 2015-10-11 – 2015-10-15 (×5): 5 mg via INTRAVENOUS
  Filled 2015-10-11 (×5): qty 2

## 2015-10-11 MED ORDER — SENNOSIDES-DOCUSATE SODIUM 8.6-50 MG PO TABS: 1.0000 | ORAL_TABLET | Freq: Every evening | ORAL | Status: DC | PRN

## 2015-10-11 MED ORDER — ZOLPIDEM TARTRATE 5 MG PO TABS
2.5000 mg | ORAL_TABLET | Freq: Every evening | ORAL | Status: DC | PRN
  Administered 2015-10-11 – 2015-10-16 (×7): 2.5 mg via ORAL
  Filled 2015-10-11 (×7): qty 1

## 2015-10-11 MED ORDER — ACETAMINOPHEN 650 MG RE SUPP: 650.0000 mg | RECTAL | Status: DC | PRN

## 2015-10-11 MED ORDER — TURMERIC CURCUMIN 500 MG PO CAPS: 2.0000 | ORAL_CAPSULE | Freq: Every day | ORAL | Status: DC

## 2015-10-11 MED ORDER — MENTHOL 3 MG MT LOZG: 1.0000 | LOZENGE | OROMUCOSAL | Status: DC | PRN

## 2015-10-11 MED ORDER — SODIUM CHLORIDE 0.9% FLUSH: 3.0000 mL | INTRAVENOUS | Status: DC | PRN

## 2015-10-11 MED ORDER — CARISOPRODOL 350 MG PO TABS
350.0000 mg | ORAL_TABLET | Freq: Every day | ORAL | Status: DC
  Administered 2015-10-12 – 2015-10-17 (×5): 350 mg via ORAL
  Filled 2015-10-11 (×5): qty 1

## 2015-10-11 MED ORDER — MORPHINE SULFATE (PF) 2 MG/ML IV SOLN
1.0000 mg | INTRAVENOUS | Status: DC | PRN
  Administered 2015-10-11 – 2015-10-12 (×5): 2 mg via INTRAVENOUS
  Administered 2015-10-13 – 2015-10-17 (×18): 4 mg via INTRAVENOUS
  Filled 2015-10-11 (×4): qty 2
  Filled 2015-10-11: qty 1
  Filled 2015-10-11 (×7): qty 2
  Filled 2015-10-11: qty 1
  Filled 2015-10-11 (×5): qty 2
  Filled 2015-10-11: qty 1
  Filled 2015-10-11 (×3): qty 2

## 2015-10-11 MED ORDER — BISACODYL 5 MG PO TBEC: 5.0000 mg | DELAYED_RELEASE_TABLET | Freq: Every day | ORAL | Status: DC | PRN

## 2015-10-11 MED ORDER — MAGNESIUM CITRATE PO SOLN
1.0000 | Freq: Once | ORAL | Status: DC | PRN
  Filled 2015-10-11: qty 296

## 2015-10-11 MED ORDER — SODIUM CHLORIDE 0.9 % IV SOLN: 250.0000 mL | INTRAVENOUS | Status: DC

## 2015-10-11 MED ORDER — NORGESTIMATE-ETH ESTRADIOL 0.25-35 MG-MCG PO TABS: 1.0000 | ORAL_TABLET | Freq: Every day | ORAL | Status: DC

## 2015-10-11 MED ORDER — CEFAZOLIN SODIUM 1-5 GM-% IV SOLN
1.0000 g | Freq: Three times a day (TID) | INTRAVENOUS | Status: AC
  Administered 2015-10-11 (×2): 1 g via INTRAVENOUS
  Filled 2015-10-11 (×2): qty 50

## 2015-10-11 MED ORDER — DIAZEPAM 5 MG/ML IJ SOLN
INTRAMUSCULAR | Status: AC
  Administered 2015-10-11: 5 mg
  Filled 2015-10-11: qty 2

## 2015-10-11 MED ORDER — PREGABALIN 75 MG PO CAPS
150.0000 mg | ORAL_CAPSULE | Freq: Every day | ORAL | Status: DC
  Administered 2015-10-11 – 2015-10-17 (×8): 150 mg via ORAL
  Filled 2015-10-11 (×9): qty 2

## 2015-10-11 MED ORDER — SENNA 8.6 MG PO TABS
1.0000 | ORAL_TABLET | Freq: Two times a day (BID) | ORAL | Status: DC
  Administered 2015-10-14 – 2015-10-17 (×4): 8.6 mg via ORAL
  Filled 2015-10-11 (×7): qty 1

## 2015-10-11 MED ORDER — DOCUSATE SODIUM 100 MG PO CAPS
100.0000 mg | ORAL_CAPSULE | Freq: Two times a day (BID) | ORAL | Status: DC
  Administered 2015-10-11 – 2015-10-18 (×7): 100 mg via ORAL
  Filled 2015-10-11 (×8): qty 1

## 2015-10-11 MED ORDER — SODIUM CHLORIDE 0.9% FLUSH
3.0000 mL | Freq: Two times a day (BID) | INTRAVENOUS | Status: DC
  Administered 2015-10-11: 3 mL via INTRAVENOUS

## 2015-10-11 MED ORDER — ALUM & MAG HYDROXIDE-SIMETH 200-200-20 MG/5ML PO SUSP
30.0000 mL | Freq: Four times a day (QID) | ORAL | Status: DC | PRN
Start: 2015-10-11 — End: 2015-10-18

## 2015-10-11 NOTE — Progress Notes (Signed)
BP 103/63 mmHg  Pulse 82  Temp(Src) 97.7 F (36.5 C) (Oral)  Resp 11  Ht 5\' 1"  (1.549 m)  Wt 64.864 kg (143 lb)  BMI 27.03 kg/m2  SpO2 94%  LMP 09/19/2015 Jasmine [name] continues to be unable to move the lower extremities. I will send her for an MRI of the cervical spinal cord to assess her lower extremity plegia. I have spoken with Jasmine [name] and her family and explained the current situation. She is now admitted to the ICU.  She is able to move the upper extremities but is weak in the C7 innervated muscles. She is able to discern me touching her right foot, and the left side. She will continue with decadron.

## 2015-10-11 NOTE — Anesthesia Postprocedure Evaluation (Signed)
Anesthesia Post Note  Patient: Willy EddyBrenda Coalson  Procedure(s) Performed: Procedure(s) (LRB): BRING BACK ANTERIOR CERVICAL DECOMPRESSION/DISCECTOMY FUSION (N/A)  Patient location during evaluation: SICU Anesthesia Type: General Level of consciousness: awake and alert Pain management: pain level controlled Vital Signs Assessment: post-procedure vital signs reviewed and stable Respiratory status: spontaneous breathing, nonlabored ventilation, respiratory function stable and patient connected to nasal cannula oxygen Cardiovascular status: blood pressure returned to baseline and stable Postop Assessment: no signs of nausea or vomiting Anesthetic complications: no    Last Vitals:  Filed Vitals:   10/11/15 0045 10/11/15 0400  BP:    Pulse: 74   Temp: 36.4 C 36.6 C  Resp: 13     Last Pain:  Filed Vitals:   10/11/15 0415  PainSc: Ardean LarsenAsleep                 Tayton Decaire E

## 2015-10-11 NOTE — Evaluation (Signed)
Physical Therapy Evaluation Patient Details Name: Jasmine [name] MRN: [id number] DOB: 1975/07/22 Today's Date: 10/11/2015   History of Present Illness  10/10/15 s/p ACDF with post operative LE plegia. PTA, pt independent, lives with spouse and children  Clinical Impression  Patient seen for evaluation. Patient demonstrates deficits in functional mobility and activity tolerance as indicated below. During assessment, patient with ability to sense pin prick and deep pressure all extremities, inability to sense light touch distal LEs. Patient was able to demonstrate 1/5 motor activity in RLE upon command, no voluntary movement on command in LLE but some involuntary movement noted with spasms. Patient reports pain as severe burning sensation. Could not tolerate EOB activity at this time. Will assess further next day.  Anticipate patient will need comprehensive therapies upon acute discharge, recommend CIR consult.    Follow Up Recommendations CIR    Equipment Recommendations  Other (comment) (TBD)    Recommendations for Other Services Rehab consult     Precautions / Restrictions Precautions Precaution Comments: pain management      Mobility  Bed Mobility                  Transfers                    Ambulation/Gait                Stairs            Wheelchair Mobility    Modified Rankin (Stroke Patients Only)       Balance                                             Pertinent Vitals/Pain Pain Assessment: 0-10 Pain Score: 9  Pain Descriptors / Indicators: Burning;Pins and needles;Sharp Pain Intervention(s): Limited activity within patient's tolerance;Monitored during session;Repositioned;Relaxation    Home Living Family/patient expects to be discharged to:: Private residence Living Arrangements: Spouse/significant other Available Help at Discharge: Family Type of Home: House Home Access: Stairs to enter Entrance  Stairs-Rails: None Secretary/administrator of Steps: 1 Home Layout: Two level;Bed/bath upstairs Home Equipment: None      Prior Function Level of Independence: Independent               Hand Dominance   Dominant Hand: Right    Extremity/Trunk Assessment   Upper Extremity Assessment: Defer to OT evaluation (diminished sensation, strength deficits L>R)           Lower Extremity Assessment: RLE deficits/detail;LLE deficits/detail RLE Deficits / Details: 1/5 ankle plantar flexion, knee extension. Ryhtmic muscular twitching noted LLE Deficits / Details: possible trace muscle activation, intense nerve pain      Communication   Communication: No difficulties  Cognition Arousal/Alertness: Awake/alert Behavior During Therapy: WFL for tasks assessed/performed Overall Cognitive Status: Within Functional Limits for tasks assessed                      General Comments      Exercises        Assessment/Plan    PT Assessment Patient needs continued PT services  PT Diagnosis Difficulty walking;Generalized weakness;Acute pain   PT Problem List Decreased strength;Decreased range of motion;Decreased activity tolerance;Decreased balance;Decreased mobility;Decreased coordination;Impaired sensation;Pain  PT Treatment Interventions DME instruction;Gait training;Stair training;Functional mobility training;Therapeutic activities;Therapeutic exercise;Balance training;Neuromuscular re-education;Cognitive remediation;Patient/family education;Manual techniques;Modalities   PT Goals (Current goals can  be found in the Care Plan section) Acute Rehab PT Goals Patient Stated Goal: to get better and go home PT Goal Formulation: With patient/family Time For Goal Achievement: 10/25/15 Potential to Achieve Goals: Good    Frequency Min 5X/week   Barriers to discharge        Co-evaluation               End of Session   Activity Tolerance: Patient limited by pain Patient  left: in bed;with call bell/phone within reach;with family/visitor present Nurse Communication: Mobility status         Time: 1415-1436 PT Time Calculation (min) (ACUTE ONLY): 21 min   Charges:   PT Evaluation $PT Eval Moderate Complexity: 1 Procedure     PT G CodesFabio Asa:        Jarion Hawthorne J 10/11/2015, 2:59 PM Charlotte Crumbevon Somnang Mahan, PT DPT  [phone]

## 2015-10-11 NOTE — Progress Notes (Signed)
Patient ID: [name], female   DOB: [birth year], 40 y.o.   MRN: [id number] BP 106/54 mmHg  Pulse 69  Temp(Src) 98.2 F (36.8 C) (Oral)  Resp 12  Ht 5\' 1"  (1.549 m)  Wt 68.6 kg (151 lb 3.8 oz)  BMI 28.59 kg/m2  SpO2 97%  LMP 09/19/2015 Alert and oriented  Has 1/5 lower extremities, sensation altered but intact Will consult rehab.  Continue to believe this is more akin to a central cord, and will improve with time.

## 2015-10-11 NOTE — Progress Notes (Signed)
Patient ID: [name], female   DOB: [birth year], 40 y.o.   MRN: [id number] Films reviewed and discussed with [name]. The altered signal in the spinal cord at C5/6 is more readily apparent now given the decompression. There is no mass, hematoma, or other entity compressing the spinal cord. Mr [name] requested that I have one or more of my partners also look over the films later this morning. I will certainly do this. There is no signal in the cord which is new, nor any spread of signal to areas where there was none. She will continue to receive decadron. [name] maintains sensation, albeit abnormal, in the lower extremities.

## 2015-10-11 NOTE — Care Management Note (Signed)
Case Management Note  Patient Details  Name: Willy EddyBrenda Paterson MRN: [id number] Date of Birth: 02-15-1976  Subjective/Objective:  Pt admitted on 10/10/15 s/p ACDF with post operative LE plegia.  PTA, pt independent, lives with spouse and children.                    Action/Plan: Will follow for discharge planning as pt progresses.  PT/OT evals pending.    Expected Discharge Date:                  Expected Discharge Plan:   IP Rehab  In-House Referral:     Discharge planning Services   CM Referral  Post Acute Care Choice:    Choice offered to:     DME Arranged:    DME Agency:     HH Arranged:    HH Agency:     Status of Service:   In process, will continue to follow  Medicare Important Message Given:    Date Medicare IM Given:    Medicare IM give by:    Date Additional Medicare IM Given:    Additional Medicare Important Message give by:     If discussed at Long Length of Stay Meetings, dates discussed:    Additional Comments:  Quintella BatonJulie W. Jaylen Claude, RN, BSN  Trauma/Neuro ICU Case Manager [phone]

## 2015-10-11 NOTE — Progress Notes (Signed)
Rehab Admissions Coordinator Note:  Patient was screened by Trish MageLogue, Danielle Lento M for appropriateness for an Inpatient Acute Rehab Consult.  At this time, we are recommending Inpatient Rehab consult.  Lelon FrohlichLogue, Aqueelah Cotrell M 10/11/2015, 4:19 PM  I can be reached at [phone].

## 2015-10-12 MED ORDER — OXYCODONE HCL 5 MG PO TABS
5.0000 mg | ORAL_TABLET | ORAL | Status: DC | PRN
  Administered 2015-10-12 – 2015-10-14 (×4): 5 mg via ORAL
  Filled 2015-10-12 (×4): qty 1

## 2015-10-12 MED ORDER — TIZANIDINE HCL 4 MG PO TABS
4.0000 mg | ORAL_TABLET | Freq: Four times a day (QID) | ORAL | Status: DC | PRN
  Administered 2015-10-12 – 2015-10-16 (×9): 4 mg via ORAL
  Filled 2015-10-12 (×11): qty 1

## 2015-10-12 MED ORDER — NORGESTIMATE-ETH ESTRADIOL 0.25-35 MG-MCG PO TABS
1.0000 | ORAL_TABLET | Freq: Every day | ORAL | Status: DC
  Administered 2015-10-14: 1 via ORAL

## 2015-10-12 NOTE — Progress Notes (Addendum)
Physical Therapy Treatment Patient Details Name: Jasmine [name] MRN: [id number] DOB: 1975-06-30 Today's Date: 10/12/2015    History of Present Illness 10/10/15 s/p ACDF with post operative LE plegia. PTA, pt independent, lives with spouse and children    PT Comments    Patient seen for neuromuscular therapeutic activity and exercises. Patient tolerated extended session well today.  OF NOTE: Increased ability to use BUEs (patient able to activate call bell repeatedly using RUE, shows increased strength and improved fine motor movement RUE. LUE shows increased strength but still shows no digit movement actively, stronger tenodesis function noted left wrist). AROM noted in both BLEs once extensor spasm broken (patient was able to actively rotate intern and external, active knee extension, and noted to be able to activate distal muscle contractions (1/5 strength LLE, 1+/5 right anti-gravity) Improved pain tolerance this session (this therapist was able to perform full PROM all extremities as well as break through techniques for spasticity and the patient was able to tolerate therapist touch). Sensory patient reports tingling sensations upon therapist touch that was not present in previous day evaluation.  Overall patient with steady improvements this date, Tolerated chair positioning in bed at conclusion of session. Will continue to see and progress as tolerated. Will ask MD about medications for spasticity/tone management to improve functional mobility.   Follow Up Recommendations  CIR     Equipment Recommendations  Other (comment) (TBD)    Recommendations for Other Services Rehab consult     Precautions / Restrictions Precautions Precaution Comments: pain management    Mobility  Bed Mobility Overal bed mobility: Needs Assistance Bed Mobility: Rolling;Supine to Sit Rolling: Mod assist   Supine to sit:  (use of bed to bring to seated modified chair position)     General bed  mobility comments: tolerated modified chair position in bed   Transfers                    Ambulation/Gait                 Stairs            Wheelchair Mobility    Modified Rankin (Stroke Patients Only)       Balance                                    Cognition Arousal/Alertness: Awake/alert Behavior During Therapy: WFL for tasks assessed/performed Overall Cognitive Status: Within Functional Limits for tasks assessed                      Exercises General Exercises - Upper Extremity Elbow Extension: AAROM;Both;10 reps Wrist Flexion: AAROM;Both;10 reps Wrist Extension: AAROM;Both;10 reps Digit Composite Flexion: PROM;AAROM;Both;10 reps (PROM on LUE) Composite Extension: AAROM;Both;10 reps General Exercises - Lower Extremity Ankle Circles/Pumps: PROM;Both;10 reps (AROM for plantarflexion) Heel Slides: PROM;Both;10 reps Hip ABduction/ADduction: PROM;Both;10 reps Hip Flexion/Marching: PROM;Both;10 reps Other Exercises Other Exercises: Rhythmic inhibition exercises for tone    General Comments        Pertinent Vitals/Pain Pain Assessment: 0-10 Pain Location: 6 Pain Descriptors / Indicators: Burning;Pins and needles;Sharp Pain Intervention(s): Limited activity within patient's tolerance;Monitored during session;Repositioned;Relaxation    Home Living                      Prior Function            PT Goals (current goals can  now be found in the care plan section) Acute Rehab PT Goals Patient Stated Goal: to get better and go home PT Goal Formulation: With patient/family Time For Goal Achievement: 10/25/15 Potential to Achieve Goals: Good Progress towards PT goals: Progressing toward goals    Frequency  Min 3X/week    PT Plan  Current plan remains appropriate    Co-evaluation             End of Session   Activity Tolerance: Patient limited by pain Patient left: in bed;with call bell/phone  within reach;with family/visitor present     Time: 1019-1101 PT Time Calculation (min) (ACUTE ONLY): 42 min  Charges:  $Therapeutic Exercise: 23-37 mins $Therapeutic Activity: 8-22 mins                    G CodesFabio Asa Nov 06, 2015, 2:29 PM Charlotte Crumb, PT DPT  [phone]

## 2015-10-12 NOTE — Progress Notes (Addendum)
  Return visit to assist with application of PRAFOs, ROM education and repositioning. Patient tolerated well. New medications on board for spasticity, with some improvements noted in LE tone. Patient educated on repositioning and importance of Q2 turns and elevation of extremities. PRAFOs donned and pt positioned comfortably in bed. OF NOTE: Patient tolerated bed in chair position for more than 3 hours.. Appears to be in better spirits with today's progress (see previous treatment session notes).  Will continue to see and progress as tolerated.    10/12/15 1500  PT Visit Information  Last PT Received On 10/12/15  Assistance Needed +2  History of Present Illness 10/10/15 s/p ACDF with post operative LE plegia. PTA, pt independent, lives with spouse and children  PT - Assessment/Plan  PT Plan Current plan remains appropriate  PT Frequency (ACUTE ONLY) Min 3X/week  Recommendations for Other Services Rehab consult  Follow Up Recommendations CIR  PT equipment Other (comment) (TBD)  PT Goal Progression  Progress towards PT goals Progressing toward goals  Acute Rehab PT Goals  PT Goal Formulation With patient/family  Time For Goal Achievement 10/25/15  Potential to Achieve Goals Good  PT Time Calculation  PT Start Time (ACUTE ONLY) 1450  PT Stop Time (ACUTE ONLY) 1508  PT Time Calculation (min) (ACUTE ONLY) 18 min  PT General Charges  $$ ACUTE PT VISIT 1 Procedure  PT Treatments  $Therapeutic Activity 8-22 mins    Charlotte Crumbevon Stevee Valenta, PT DPT  [phone]

## 2015-10-12 NOTE — Progress Notes (Signed)
Orthopedic Tech Progress Note Patient Details:  Jasmine EddyBrenda [name] 1975/09/21 [id number]  Patient ID: Jasmine EddyBrenda Sabatino, female   DOB: 1975/09/21, 40 y.o.   MRN: [id number]   Saul FordyceJennifer C Judyth Demarais 10/12/2015, 11:01 AMCalled [employer] for bilateral prafo boots.

## 2015-10-12 NOTE — Progress Notes (Addendum)
Pt seen and examined. No issues overnight. Cont to have burning pain of both legs. They report subtle improvement in right hand grip, otherwise no change in strength.  EXAM: Temp:  [98.1 F (36.7 C)-98.6 F (37 C)] 98.6 F (37 C) (05/06 0839) Pulse Rate:  [54-91] 75 (05/06 0700) Resp:  [8-21] 17 (05/06 0700) BP: (82-111)/(39-75) 111/64 mmHg (05/06 0700) SpO2:  [95 %-100 %] 100 % (05/06 0700) Intake/Output      05/05 0701 - 05/06 0700 05/06 0701 - 05/07 0700   P.O. 120    I.V. (mL/kg) 1873.3 (27.3) 80 (1.2)   IV Piggyback 50    Total Intake(mL/kg) 2043.3 (29.8) 80 (1.2)   Urine (mL/kg/hr) 2550 (1.5)    Blood     Total Output 2550     Net -506.7 +80         Awake, alert CN intact Good RUE x 3/5 grip Good proximal LUE, 3/5 wrist ext, no intrinisics Flickers BLE wound c/d/i  LABS: Lab Results  Component Value Date   CREATININE 0.6 04/11/2013   BUN 19 04/11/2013   NA 136 04/11/2013   K 3.6 04/11/2013   CL 100 04/11/2013   CO2 28 04/11/2013   Lab Results  Component Value Date   WBC 11.3* 10/10/2015   HGB 12.8 10/10/2015   HCT 39.3 10/10/2015   MCV 85.4 10/10/2015   PLT 276 10/10/2015    IMAGING: Pre- and postop MRIs reviewed. Demonstrate cord signal change preop with perhaps very subtle worsening postop. No cervical hematoma, good hardware placement, good alignment.  IMPRESSION: - 40 y.o. female POD#2 s/p ACDF with postop SCI  PLAN: - Cont decadron - Will add oxycodone 5mg  q4 PRN - Cont Lyrica - Will likely need rehab  I spent approximately 30 min with the patient and her husband reviewing the current situation. We discussed her MRI results and the possible outcomes from here. I am somewhat optimistic there is a chance for neurologic recovery. All their questions were answered.

## 2015-10-13 MED ORDER — DEXAMETHASONE SODIUM PHOSPHATE 4 MG/ML IJ SOLN
6.0000 mg | Freq: Four times a day (QID) | INTRAMUSCULAR | Status: DC
  Administered 2015-10-13 – 2015-10-16 (×11): 6 mg via INTRAVENOUS
  Filled 2015-10-13 (×11): qty 2

## 2015-10-13 NOTE — Progress Notes (Signed)
After reviewing chart noted that pt should have been on decadron. Per MAR last dose given 5/5 at 1300. Physician on call notified and orders received.

## 2015-10-13 NOTE — Progress Notes (Signed)
Pt seen and examined. No issues overnight. Pt and husband have noted clear improvements in movement yesterday. Also report decreased dysesthetic pain in the legs.  EXAM: Temp:  [97.5 F (36.4 C)-99.3 F (37.4 C)] 98.7 F (37.1 C) (05/07 0800) Pulse Rate:  [52-82] 78 (05/07 0800) Resp:  [8-23] 17 (05/07 0800) BP: (81-121)/(43-75) 93/55 mmHg (05/07 0800) SpO2:  [93 %-100 %] 97 % (05/07 0800) Intake/Output      05/06 0701 - 05/07 0700 05/07 0701 - 05/08 0700   P.O.     I.V. (mL/kg) 1920 (28) 80 (1.2)   IV Piggyback     Total Intake(mL/kg) 1920 (28) 80 (1.2)   Urine (mL/kg/hr) 1885 (1.1)    Total Output 1885     Net +35 +80         Awake, alert, oriented CN intact 3/5 right grip/intrinsics 3/5 left WE, still no intrinsics 1-2/5 proximal BLE  LABS: Lab Results  Component Value Date   CREATININE 0.6 04/11/2013   BUN 19 04/11/2013   NA 136 04/11/2013   K 3.6 04/11/2013   CL 100 04/11/2013   CO2 28 04/11/2013   Lab Results  Component Value Date   WBC 11.3* 10/10/2015   HGB 12.8 10/10/2015   HCT 39.3 10/10/2015   MCV 85.4 10/10/2015   PLT 276 10/10/2015    IMPRESSION: - 40 y.o. female s/p ACDF with postop weakness, slowly but clearly improving  PLAN: - Cont steroids - Cont PT/OT - Can move out of ICU if bed needed.

## 2015-10-14 DIAGNOSIS — G8918 Other acute postprocedural pain: Secondary | ICD-10-CM

## 2015-10-14 DIAGNOSIS — R208 Other disturbances of skin sensation: Secondary | ICD-10-CM

## 2015-10-14 DIAGNOSIS — D72829 Elevated white blood cell count, unspecified: Secondary | ICD-10-CM | POA: Insufficient documentation

## 2015-10-14 DIAGNOSIS — M159 Polyosteoarthritis, unspecified: Secondary | ICD-10-CM | POA: Insufficient documentation

## 2015-10-14 DIAGNOSIS — R0689 Other abnormalities of breathing: Secondary | ICD-10-CM | POA: Insufficient documentation

## 2015-10-14 DIAGNOSIS — Z419 Encounter for procedure for purposes other than remedying health state, unspecified: Secondary | ICD-10-CM | POA: Insufficient documentation

## 2015-10-14 DIAGNOSIS — G8220 Paraplegia, unspecified: Secondary | ICD-10-CM

## 2015-10-14 DIAGNOSIS — M4322 Fusion of spine, cervical region: Secondary | ICD-10-CM

## 2015-10-14 DIAGNOSIS — M5020 Other cervical disc displacement, unspecified cervical region: Secondary | ICD-10-CM

## 2015-10-14 DIAGNOSIS — S14105A Unspecified injury at C5 level of cervical spinal cord, initial encounter: Secondary | ICD-10-CM | POA: Insufficient documentation

## 2015-10-14 DIAGNOSIS — S14109D Unspecified injury at unspecified level of cervical spinal cord, subsequent encounter: Secondary | ICD-10-CM

## 2015-10-14 MED ORDER — DULOXETINE HCL 30 MG PO CPEP
30.0000 mg | ORAL_CAPSULE | Freq: Every day | ORAL | Status: DC
  Administered 2015-10-14 – 2015-10-18 (×5): 30 mg via ORAL
  Filled 2015-10-14 (×5): qty 1

## 2015-10-14 NOTE — Progress Notes (Signed)
Patient ID: Jasmine [name], female   DOB: 1975-09-12, 40 y.o.   MRN: [id number] BP 132/73 mmHg  Pulse 55  Temp(Src) 98.5 F (36.9 C) (Oral)  Resp 12  Ht 5\' 1"  (1.549 m)  Wt 68.6 kg (151 lb 3.8 oz)  BMI 28.59 kg/m2  SpO2 98%  LMP 09/19/2015 Alert and oriented x 4 Still with severe allodynia will start cymbalta today Spasms now gone More  Movement in the feet Making slow progress, cervical wound is clean, and dry. kc

## 2015-10-14 NOTE — Progress Notes (Signed)
I will follow pt's progress with ability to tolerate therapy before pursuing insurance authorization for an inpt rehab admission. [phone]

## 2015-10-14 NOTE — Evaluation (Signed)
Occupational Therapy Evaluation Patient Details Name: Jasmine [name] MRN: [id number] DOB: 1975/11/06 Today's Date: 10/14/2015    History of Present Illness 10/10/15 s/p ACDF with post operative LE plegia. PTA, pt independent, lives with spouse and children   Clinical Impression   Pt admitted with above. She demonstrates the below listed deficits and will benefit from continued OT to maximize safety and independence with BADLs.  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  She has signficant allodynia, and is very fearful of moving.  She was able to engage in simple ADL tasks and exercises today.   Pt and spouse instructed importance of movement, performing ADLs, desensitization techniques and initial HEP.  Pt would benefit from resting hand splint for Lt UE for night time use to prevent contracture.  Recommend CIR.  Will follow.       Follow Up Recommendations  CIR;Supervision/Assistance - 24 hour    Equipment Recommendations  3 in 1 bedside comode;Hospital bed;Wheelchair (measurements OT)    Recommendations for Other Services Rehab consult     Precautions / Restrictions Precautions Precautions: Cervical Precaution Comments: pain management      Mobility Bed Mobility Overal bed mobility: Needs Assistance Bed Mobility: Rolling Rolling: Max assist;+2 for physical assistance         General bed mobility comments: Pt very fearful of prospect of increased pain.  Needs encouragement   Transfers                 General transfer comment: unable to attempt     Balance                                            ADL Overall ADL's : Needs assistance/impaired Eating/Feeding: Bed level;Total assistance Eating/Feeding Details (indicate cue type and reason): mod A to drink from cup with Rt hand  Grooming: Wash/dry face;Set up;Bed level Grooming Details (indicate cue type and reason): with encouragement, pt able to wash face   Upper Body Bathing: Maximal assistance;Bed level   Lower Body Bathing: Total assistance;Bed level   Upper Body Dressing : Total assistance;Bed level   Lower Body Dressing: Total assistance;Bed level   Toilet Transfer: Total assistance   Toileting- Clothing Manipulation and Hygiene: Total assistance;Bed level               Vision     Perception     Praxis      Pertinent Vitals/Pain Pain Assessment: 0-10 Pain Score: 6  Pain Location: bil. LEs chest, UEs  Pain Descriptors / Indicators: Burning Pain Intervention(s): Monitored during session     Hand Dominance Right   Extremity/Trunk Assessment Upper Extremity Assessment Upper Extremity Assessment: RUE deficits/detail;LUE deficits/detail RUE Deficits / Details: Pt with strength grossly 4-/5.  difficulty extending IP digits IV and V - appears ulnar neuropathy.  PROM WFL.  Impaired sensation  RUE Sensation: decreased light touch;decreased proprioception RUE Coordination: decreased fine motor;decreased gross motor LUE Deficits / Details: shoulder 4/5; bicep 4-/5; tricep 1/5; wrist 3+/5 flex/ext.; finger flexion and extension 1/5.  Strong tenodesis  LUE Sensation: decreased light touch;decreased proprioception LUE Coordination: decreased fine motor;decreased gross motor   Lower Extremity Assessment Lower Extremity Assessment: Defer to PT evaluation       Communication Communication Communication: No difficulties   Cognition Arousal/Alertness: Awake/alert Behavior During Therapy: WFL for tasks assessed/performed Overall Cognitive Status: Within  Functional Limits for tasks assessed                     General Comments       Exercises Exercises: General Upper Extremity;General Lower Extremity     Shoulder Instructions      Home Living Family/patient expects to be discharged to:: Private residence Living Arrangements: Spouse/significant other Available Help at Discharge: Family Type of Home:  House Home Access: Stairs to enter Secretary/administratorntrance Stairs-Number of Steps: 1 Entrance Stairs-Rails: None Home Layout: Two level;Bed/bath upstairs Alternate Level Stairs-Number of Steps: 12 Alternate Level Stairs-Rails: Can reach both           Home Equipment: None          Prior Functioning/Environment Level of Independence: Independent        Comments: works as Publishing copycashier     OT Diagnosis: Generalized weakness;Acute pain   OT Problem List: Decreased strength;Decreased activity tolerance;Impaired balance (sitting and/or standing);Decreased coordination;Decreased safety awareness;Decreased knowledge of use of DME or AE;Decreased knowledge of precautions;Impaired sensation;Impaired UE functional use;Impaired tone;Pain   OT Treatment/Interventions: Self-care/ADL training;Therapeutic exercise;Neuromuscular education;DME and/or AE instruction;Manual therapy;Splinting;Therapeutic activities;Patient/family education;Balance training    OT Goals(Current goals can be found in the care plan section) Acute Rehab OT Goals Patient Stated Goal: get back to normal  OT Goal Formulation: With patient/family Time For Goal Achievement: 10/28/15 Potential to Achieve Goals: Good ADL Goals Pt Will Perform Eating: with modified independence;with adaptive utensils;sitting;bed level Pt Will Perform Grooming: with min assist;sitting;with adaptive equipment Pt Will Perform Upper Body Bathing: with min assist;sitting Pt/caregiver will Perform Home Exercise Program: Increased ROM;Increased strength;Right Upper extremity;Left upper extremity;With written HEP provided Additional ADL Goal #1: Pt will tolerate sitting EOB x 15 mins with min A in prep for increased independence with ADLs  Additional ADL Goal #2: Pt and spouse will be independent with desensitization program bil. UEs   OT Frequency: Min 3X/week   Barriers to D/C:            Co-evaluation              End of Session Nurse Communication:  Mobility status  Activity Tolerance: Patient tolerated treatment well Patient left: in bed;with call bell/phone within reach;with family/visitor present   Time: [id number] OT Time Calculation (min): 89 min Charges:  OT General Charges $OT Visit: 1 Procedure OT Evaluation $OT Eval Moderate Complexity: 1 Procedure OT Treatments $Therapeutic Activity: 68-82 mins G-Codes:    Ladoris Lythgoe M 10/14/2015, 4:29 PM
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
## 2015-10-15 MED ORDER — DIPHENHYDRAMINE HCL 25 MG PO CAPS
25.0000 mg | ORAL_CAPSULE | Freq: Four times a day (QID) | ORAL | Status: DC | PRN
  Administered 2015-10-15: 25 mg via ORAL
  Filled 2015-10-15: qty 1

## 2015-10-15 NOTE — Progress Notes (Signed)
Orthopedic Tech Progress Note Patient Details:  Jasmine [name] Holck 09/22/1975 [id number] Called [employer] for brace order. Patient ID: Jasmine [name] Patient, female   DOB: 09/22/1975, 40 y.o.   MRN: [id number]   Jennye MoccasinHughes, Dainel Arcidiacono Craig 10/15/2015, 3:01 PM

## 2015-10-15 NOTE — Progress Notes (Signed)
Occupational Therapy Treatment Note:  Pt provided with adapted feeding equipment.  She is able to drink from cup mod I, and able to feed self with minA.  Pt with brighter spirits, using bil. UEs much more.  Recommend Lt resting hand splint to prevent contractures.     10/15/15 2022  OT Visit Information  Last OT Received On 10/15/15  Assistance Needed +2  History of Present Illness 10/10/15 s/p ACDF with post operative LE plegia. PTA, pt independent, lives with spouse and children  Precautions  Precautions Cervical  Precaution Comments pain management  Pain Assessment  Pain Assessment Faces  Faces Pain Scale 2  Pain Location bil. UEs, LEs, chest, and buttocks   Pain Descriptors / Indicators Burning;Constant;Numbness  Pain Intervention(s) Monitored during session  Cognition  Arousal/Alertness Awake/alert  Behavior During Therapy WFL for tasks assessed/performed  Overall Cognitive Status Within Functional Limits for tasks assessed  ADL  Overall ADL's  Needs assistance/impaired  Eating/Feeding Minimal assistance;Bed level  Eating/Feeding Details (indicate cue type and reason) Pt provided with lidded cup and foam build up handles.  She was able to drink from cup mod I using Rt UE, and requires min A to feed self Jello  OT - End of Session  Activity Tolerance Patient tolerated treatment well  Patient left in bed;with call bell/phone within reach;with family/visitor present  OT Assessment/Plan  OT Plan Discharge plan remains appropriate  OT Frequency (ACUTE ONLY) Min 3X/week  Recommendations for Other Services Rehab consult  Follow Up Recommendations CIR;Supervision/Assistance - 24 hour  OT Equipment 3 in 1 bedside comode;Hospital bed;Wheelchair (measurements OT)  OT Goal Progression  Progress towards OT goals Progressing toward goals  OT Time Calculation  OT Start Time (ACUTE ONLY) 1450  OT Stop Time (ACUTE ONLY) 1506  OT Time Calculation (min) 16 min  OT General Charges  $OT  Visit 1 Procedure  OT Treatments  $Self Care/Home Management  8-22 mins  Reynolds AmericanWendi Khush [name], OTR/L [phone]

## 2015-10-15 NOTE — Progress Notes (Signed)
Physical Therapy Treatment Patient Details Name: Jasmine [name] MRN: [id number] DOB: 1975-06-11 Today's Date: 10/15/2015    History of Present Illness 10/10/15 s/p ACDF with post operative LE plegia. PTA, pt independent, lives with spouse and children    PT Comments    Patient with impressive improvements noted during today's session. Patient tolerated hour co-treatment session with both PT/OT. Patient sat EOB ~30 minutes performing dynamic trunk control activities, functional tasks and LE therapeutic activities. Improvements noted in patients strength UEs and RLE, LLE. Patient was able to sit EOB and actively move RLE off the ground and perform abbreviated long arc quads. Patient was also able to implement tone control strategies throughout session with cues. While EOB patient was able to progress from total assist to minimal assist with improved trunk control and ability to engage core. Improvements also noted in pain/activity tolerance. Husband present during session and educated on new activities for HEP. At this time feel patient is progressing well with therapies, will increased frequency to 4x/wk as patient making steady gains and tolerating increased activity. Will continue to see as indicated and progress as tolerated. Continue to recommend CIR upon acute discharge.    Follow Up Recommendations  CIR     Equipment Recommendations  Other (comment) (TBD)    Recommendations for Other Services Rehab consult     Precautions / Restrictions Precautions Precautions: Cervical Precaution Comments: pain management Restrictions Weight Bearing Restrictions: No    Mobility  Bed Mobility Overal bed mobility: Needs Assistance Bed Mobility: Rolling Rolling: Max assist;+2 for physical assistance   Supine to sit: Max assist;+2 for physical assistance        Transfers                 General transfer comment: unable to attempt   Ambulation/Gait                  Stairs            Wheelchair Mobility    Modified Rankin (Stroke Patients Only)       Balance Overall balance assessment: Needs assistance Sitting-balance support: Bilateral upper extremity supported;Feet supported Sitting balance-Leahy Scale: Poor Sitting balance - Comments: patient tolerated full session of sitting activites at EOB ~30 minutes. Tolerated trunk flexion/extension, dynamic sitting balance, LEs therapeutic activites, and functional tasks. Patient progressed throughout session with less assist required and improvements noted in functional ability for trunk as well as RLE. Postural control: Posterior lean                          Cognition Arousal/Alertness: Awake/alert Behavior During Therapy: WFL for tasks assessed/performed Overall Cognitive Status: Within Functional Limits for tasks assessed                      Exercises General Exercises - Lower Extremity Ankle Circles/Pumps: PROM;Both;10 reps (AROM for plantarflexion) Heel Slides: PROM;Both;10 reps Hip ABduction/ADduction: PROM;Both;10 reps Hip Flexion/Marching: PROM;Both;10 reps Other Exercises Other Exercises: sitting trunk control activities, actively performing flexion extension of trunk Other Exercises: sitting EOB performed RLE movement through short ranger with progression  Other Exercises: heel cord stretch performed by both therapist and instruction provided for HEP with spouse Other Exercises: Education on methods to assist with tone control and spasms    General Comments        Pertinent Vitals/Pain Pain Assessment: 0-10 Pain Score: 4  Pain Location: Bilateral LEs and buttocks Pain Descriptors / Indicators: Burning Pain  Intervention(s): Monitored during session;Premedicated before session;Repositioned    Home Living                      Prior Function            PT Goals (current goals can now be found in the care plan section) Acute Rehab PT  Goals Patient Stated Goal: get back to normal  PT Goal Formulation: With patient/family Time For Goal Achievement: 10/25/15 Potential to Achieve Goals: Good Progress towards PT goals: Progressing toward goals    Frequency  Min 4X/week    PT Plan Current plan remains appropriate    Co-evaluation PT/OT/SLP Co-Evaluation/Treatment: Yes Reason for Co-Treatment: Complexity of the patient's impairments (multi-system involvement) PT goals addressed during session: Mobility/safety with mobility       End of Session   Activity Tolerance: Patient tolerated treatment well Patient left: in bed;with call bell/phone within reach;with family/visitor present     Time: [id number] PT Time Calculation (min) (ACUTE ONLY): 54 min  Charges:  $Therapeutic Exercise: 8-22 mins $Therapeutic Activity: 8-22 mins                    G CodesFabio Asa:      Artemis Koller J 10/15/2015, 5:28 PM Charlotte Crumbevon Casson Catena, PT DPT  [phone]

## 2015-10-15 NOTE — Progress Notes (Signed)
Orthopedic Tech Progress Note Patient Details:  Jasmine [name] 1976-02-07 [id number] Brace order completed by bio-tech vendor. Patient ID: Jasmine [name], female   DOB: 1976-02-07, 40 y.o.   MRN: [id number]   Jasmine [name], Jasmine [name] 10/15/2015, 4:01 PM

## 2015-10-15 NOTE — Progress Notes (Signed)
Patient ID: [name], female   DOB: 1975-06-17, 40 y.o.   MRN: [id number] BP 145/74 mmHg  Pulse 49  Temp(Src) 98.8 F (37.1 C) (Oral)  Resp 20  Ht 5\' 1"  (1.549 m)  Wt 68.6 kg (151 lb 3.8 oz)  BMI 28.59 kg/m2  SpO2 97%  LMP 09/19/2015 Alert and oriented x 4, moving better in lower extremities Pain better controlled today Eventually will go to rehab improving

## 2015-10-15 NOTE — Progress Notes (Addendum)
I met with pt an dher husband at bedside to discuss a possible inpt rehab admission pending her ability to tolerate more intense therapies and insurance approval. They live in a two level home with bed and bath upstairs. Pt does not have family support to provide for pt when spouse works during the day. Pt and spouse goal is for pt to walk at her daughter's graduation on 11/18/15 from [employer]. Karene Fry will follow pt this week as I will be off. [phone]

## 2015-10-15 NOTE — Progress Notes (Signed)
Occupational Therapy Treatment Patient Details Name: Jasmine [name] MRN: [id number] DOB: Jul 05, 1975 Today's Date: 10/15/2015    History of present illness 10/10/15 s/p ACDF with post operative LE plegia. PTA, pt independent, lives with spouse and children   OT comments  Pt demonstrates improved affect this pm.  She reports decreased pain.  She was able to sit EOB and actively participate in PT/OT > 40 mins.  She demonstrates slowly improving ROM/strength bil. UEs.   VSS throughout session.  Recommend CIR.   Follow Up Recommendations  CIR;Supervision/Assistance - 24 hour    Equipment Recommendations  3 in 1 bedside comode;Hospital bed;Wheelchair (measurements OT)    Recommendations for Other Services Rehab consult    Precautions / Restrictions Precautions Precautions: Cervical Precaution Comments: pain management Restrictions Weight Bearing Restrictions: No       Mobility Bed Mobility Overal bed mobility: Needs Assistance Bed Mobility: Rolling Rolling: Max assist;+2 for physical assistance   Supine to sit: Max assist;+2 for physical assistance        Transfers                 General transfer comment: unable to attempt     Balance Overall balance assessment: Needs assistance Sitting-balance support: Feet supported;Bilateral upper extremity supported Sitting balance-Leahy Scale: Poor Sitting balance - Comments: patient tolerated full session of sitting activites at EOB ~30 minutes. Tolerated trunk flexion/extension, dynamic sitting balance, LEs therapeutic activites, and functional tasks. Patient progressed throughout session with less assist required and improvements noted in functional ability for trunk as well as RLE. Postural control: Posterior lean                         ADL                               Toileting- Clothing Manipulation and Hygiene: Total assistance;Bed level                Vision                     Perception     Praxis      Cognition   Behavior During Therapy: [facility] for tasks assessed/performed Overall Cognitive Status: Within Functional Limits for tasks assessed                       Extremity/Trunk Assessment               Exercises General Exercises - Upper Extremity Elbow Extension: AROM;AAROM;Left;5 reps;Supine Digit Composite Flexion: AROM;AAROM;Right;Left;10 reps;Supine Composite Extension: AAROM;Right;Left;10 reps;Supine General Exercises - Lower Extremity Ankle Circles/Pumps: PROM;Both;10 reps (AROM for plantarflexion) Heel Slides: PROM;Both;10 reps Hip ABduction/ADduction: PROM;Both;10 reps Hip Flexion/Marching: PROM;Both;10 reps Other Exercises Other Exercises: facilitation of thoracic and lumbar extension with pt seated EOB with mod A Other Exercises: sitting EOB performed RLE movement through short ranger with progression  Other Exercises: heel cord stretch performed by both therapist and instruction provided for HEP with spouse Other Exercises: Education on methods to assist with tone control and spasms   Shoulder Instructions       General Comments      Pertinent Vitals/ Pain       Pain Assessment: 0-10 Pain Score: 4  Pain Location: bil LEs, hands, and chest  Pain Descriptors / Indicators: Burning;Constant Pain Intervention(s): Monitored during session;Premedicated before session;Repositioned  Home Living  Prior Functioning/Environment              Frequency Min 3X/week     Progress Toward Goals  OT Goals(current goals can now be found in the care plan section)  Progress towards OT goals: Progressing toward goals  Acute Rehab OT Goals Patient Stated Goal: get back to normal  ADL Goals Pt Will Perform Eating: with modified independence;with adaptive utensils;sitting;bed level Pt Will Perform Grooming: with min assist;sitting;with adaptive equipment Pt Will  Perform Upper Body Bathing: with min assist;sitting Pt/caregiver will Perform Home Exercise Program: Increased ROM;Increased strength;Right Upper extremity;Left upper extremity;With written HEP provided Additional ADL Goal #1: Pt will tolerate sitting EOB x 15 mins with min A in prep for increased independence with ADLs  Additional ADL Goal #2: Pt and spouse will be independent with desensitization program bil. UEs   Plan Discharge plan remains appropriate    Co-evaluation    PT/OT/SLP Co-Evaluation/Treatment: Yes Reason for Co-Treatment: Complexity of the patient's impairments (multi-system involvement);For patient/therapist safety PT goals addressed during session: Mobility/safety with mobility OT goals addressed during session: Strengthening/ROM;ADL's and self-care      End of Session     Activity Tolerance Patient tolerated treatment well   Patient Left in bed;with call bell/phone within reach;with family/visitor present   Nurse Communication Mobility status        Time: [id number] OT Time Calculation (min): 68 min  Charges: OT General Charges $OT Visit: 1 Procedure OT Treatments $Neuromuscular Re-education: 23-37 mins  Kailei Cowens M 10/15/2015, 8:20 PM

## 2015-10-16 MED ORDER — DEXAMETHASONE 4 MG PO TABS
4.0000 mg | ORAL_TABLET | Freq: Three times a day (TID) | ORAL | Status: DC
  Administered 2015-10-16 – 2015-10-18 (×6): 4 mg via ORAL
  Filled 2015-10-16 (×6): qty 1

## 2015-10-16 NOTE — Progress Notes (Signed)
Physical Therapy Treatment Patient Details Name: Jasmine [name] MRN: [id number] DOB: 1976/01/09 Today's Date: 10/16/2015    History of Present Illness 10/10/15 s/p ACDF with post operative LE plegia. PTA, pt independent, lives with spouse and children    PT Comments    Patient seen by therapy for OOB progression today. Patient very motivated and tolerated transfer well. Utilized lift for OOB to chair with focus of activity tolerance, upright positioning, pressure relief and functional positioning for self feeding throughout lunch.  OF NOTE: patient remains limited by spastic tone in BLEs, when tone broken, noted AROM present but limited. May be beneficial to consider stronger management of spasticity. Will continue to monitor and progress as tolerated.    Follow Up Recommendations  CIR     Equipment Recommendations  Other (comment) (TBD)    Recommendations for Other Services Rehab consult     Precautions / Restrictions Precautions Precautions: Cervical Precaution Comments: pain management Restrictions Weight Bearing Restrictions: No    Mobility  Bed Mobility Overal bed mobility: Needs Assistance Bed Mobility: Rolling;Supine to Sit Rolling: Mod assist            Transfers Overall transfer level: Needs assistance               General transfer comment: use of lift for trasfer OOB to chair to increased functional status so that patient could perform self feeding in normalized position. Tolerated extesive time in chair >1 hour without difficulty.  Ambulation/Gait                 Stairs            Wheelchair Mobility    Modified Rankin (Stroke Patients Only)       Balance                                    Cognition Arousal/Alertness: Awake/alert Behavior During Therapy: WFL for tasks assessed/performed Overall Cognitive Status: Within Functional Limits for tasks assessed                      Exercises Other  Exercises Other Exercises: heel cord stretch Other Exercises: PROM BLEs    General Comments        Pertinent Vitals/Pain Pain Assessment: Faces Pain Location: Bilateral LEs and buttocks Pain Descriptors / Indicators: Burning;Constant;Numbness Pain Intervention(s): Limited activity within patient's tolerance;Monitored during session;Repositioned    Home Living                      Prior Function            PT Goals (current goals can now be found in the care plan section) Acute Rehab PT Goals Patient Stated Goal: get back to normal  PT Goal Formulation: With patient/family Time For Goal Achievement: 10/25/15 Potential to Achieve Goals: Good Progress towards PT goals: Progressing toward goals    Frequency  Min 4X/week    PT Plan Current plan remains appropriate    Co-evaluation             End of Session Equipment Utilized During Treatment:  (geo mat in place) Activity Tolerance: Patient limited by pain Patient left: in chair;with call bell/phone within reach;with family/visitor present     Time: 1202-1227 PT Time Calculation (min) (ACUTE ONLY): 25 min  Charges:  $Therapeutic Activity: 23-37 mins  G CodesFabio Asa:      Ramiro Pangilinan J 10/16/2015, 5:23 PM Charlotte Crumbevon Favor Hackler, PT DPT  [phone]

## 2015-10-16 NOTE — Progress Notes (Addendum)
Notified Dr. Franky Machoabbell of pt having bradycardia.  Pt's HR in low 40's with occasional HR of 39 at rest.  Pt asymptomatic and no other changes.  BP fine.  Pt mentating. No orders at this time.  Will continue to monitor pt.

## 2015-10-16 NOTE — Progress Notes (Signed)
Patient ID: [name], female   DOB: January 28, 1976, 40 y.o.   MRN: [id number] BP 166/79 mmHg  Pulse 49  Temp(Src) 98.2 F (36.8 C) (Oral)  Resp 17  Ht 5\' 1"  (1.549 m)  Wt 68.6 kg (151 lb 3.8 oz)  BMI 28.59 kg/m2  SpO2 97%  LMP 09/19/2015 Alert and oriented x 4 1-2/5 lower extremities. Able to sit up in a chair longer today Pain slightly better, by no means resolved.  Will give a lower dose of decadron.

## 2015-10-17 MED ORDER — BACLOFEN 10 MG PO TABS: 10.0000 mg | ORAL_TABLET | Freq: Three times a day (TID) | ORAL | Status: DC

## 2015-10-17 MED ORDER — BACLOFEN 10 MG PO TABS
10.0000 mg | ORAL_TABLET | Freq: Three times a day (TID) | ORAL | Status: DC
  Administered 2015-10-17 – 2015-10-18 (×5): 10 mg via ORAL
  Filled 2015-10-17 (×5): qty 1

## 2015-10-17 NOTE — Progress Notes (Signed)
Physical Therapy Treatment Patient Details Name: Jasmine EddyBrenda Sissel MRN: [id number] DOB: [birth year] Today's Date: 10/17/2015    History of Present Illness 10/10/15 s/p ACDF with post operative LE plegia. PTA, pt independent, lives with spouse and children    PT Comments    Patient seen for extended therapy session to assess activity tolerance. Patient tolerated well. Performed ~15 minutes of therapeutic exercises in bed prior to working on bed mobility. Patient then lifted to w/c and assisted off of the unit to the courtyard and tolerated initial training for w/c management, positioning, weight shifts and dynamic trunk control activities. Patient and spouse were very excited at ability to get outside and to initiate w/c training. VSS throughout. Upon return to room, patient tolerated additional AROM exercises, demonstrates the ability to perform LE movement throughout ROM on RLE and partial ROM through LLE. Overall active motion appears improved compared to previous session but sensory remains diminished. Patient assisted back to bed using maxi sky lift. Upon return to supine, husband was educated on active and active assisted LE exercises to perform. Patient and spouse performed these well with minimal cues after initial instruction. Overall patient tolerated 75 minute session without complication or distress. Patient appears to have improvements in pain as she progresses through stimulation and activity. Anticipate that patient will have no difficulty tolerating increased 3 hours of comprehensive therapies. Recommend CIR upon acute discharge. Will continue to see to facilitate discharge as indicated.    Follow Up Recommendations  CIR     Equipment Recommendations  Other (comment) (TBD)    Recommendations for Other Services Rehab consult     Precautions / Restrictions Precautions Precautions: Cervical Precaution Comments: pain management Restrictions Weight Bearing Restrictions: No     Mobility  Bed Mobility Overal bed mobility: Needs Assistance Bed Mobility: Rolling;Supine to Sit Rolling: Mod assist         General bed mobility comments: patient with improvements in use of UEs to roll for bed mobility several times.  Transfers Overall transfer level: Needs assistance               General transfer comment: use of lift for trasfer OOB to chair to increased functional status so that patient could perform self feeding in normalized position. Tolerated extesive time in chair >1 hour without difficulty.  Ambulation/Gait                 Stairs            Wheelchair Mobility    Modified Rankin (Stroke Patients Only)       Balance     Sitting balance-Leahy Scale: Poor Sitting balance - Comments: patient tolerated sitting in wheelchair ~ 45 minutes with focus on weight shifts, trunk control and repositioning education                            Cognition Arousal/Alertness: Awake/alert Behavior During Therapy: WFL for tasks assessed/performed Overall Cognitive Status: Within Functional Limits for tasks assessed                      Exercises General Exercises - Lower Extremity Ankle Circles/Pumps: PROM;Both;10 reps (AROM for plantarflexion) Quad Sets: AROM;Both;5 reps (response >R than L) Long Arc Quad: AROM;Both;10 reps (performed sitting in chair ROM >R than L) Heel Slides: PROM;Both;10 reps Hip ABduction/ADduction: PROM;Both;10 reps Hip Flexion/Marching: PROM;Both;10 reps Other Exercises Other Exercises: sitting trunk control activities, actively performing flexion extension of trunk Other  Exercises: HEP education with teachback perform for spouse for LE tone control and AROM therapeutic exercise Other Exercises: heel cord stretch    General Comments        Pertinent Vitals/Pain Pain Assessment: Faces Faces Pain Scale: Hurts a little bit Pain Location: shoulders and neck Pain Descriptors / Indicators:  Aching Pain Intervention(s): Monitored during session    Home Living                      Prior Function            PT Goals (current goals can now be found in the care plan section) Acute Rehab PT Goals Patient Stated Goal: get back to normal  PT Goal Formulation: With patient/family Time For Goal Achievement: 10/25/15 Potential to Achieve Goals: Good Progress towards PT goals: Progressing toward goals    Frequency  Min 4X/week    PT Plan Current plan remains appropriate    Co-evaluation             End of Session   Activity Tolerance: Patient limited by pain Patient left: in bed;with call bell/phone within reach;with family/visitor present     Time: 4098-1191 PT Time Calculation (min) (ACUTE ONLY): 75 min  Charges:  $Therapeutic Exercise: 8-22 mins $Therapeutic Activity: 23-37 mins $Self Care/Home Management: 8-22 $Wheel Chair Management: 8-22 mins                    G CodesFabio Asa 2015-11-05, 4:10 PM Charlotte Crumb, PT DPT  [phone]

## 2015-10-17 NOTE — Progress Notes (Signed)
Patient ID: Jasmine [name], female   DOB: 08-07-1975, 40 y.o.   MRN: [id number] BP 129/74 mmHg  Pulse 56  Temp(Src) 98.1 F (36.7 C) (Oral)  Resp 16  Ht 5\' 1"  (1.549 m)  Wt 68.6 kg (151 lb 3.8 oz)  BMI 28.59 kg/m2  SpO2 100%  LMP 09/19/2015 Strength increasing in lower extremities Spasms improved though still present, baclofen started Wound is clean, dry, without signs of infection Rehab on monday

## 2015-10-17 NOTE — Progress Notes (Signed)
Rehab admissions - I met with patient and her husband.  Husband and son may be able to work different shifts and will plan to be with patient at home after a rehab stay.  Patient and husband want to come to inpatient rehab here at [facility] for therapies.  I need to open the case with [employer] close to when patient is ready for rehab.  I am thinking inpatient rehab early next week, but we need to confirm target date with Dr. Christella Noa.  Call me for questions.  #[phone]

## 2015-10-18 ENCOUNTER — Encounter (HOSPITAL_COMMUNITY): Payer: Self-pay

## 2015-10-18 ENCOUNTER — Inpatient Hospital Stay (HOSPITAL_COMMUNITY)
Admission: RE | Admit: 2015-10-18 | Discharge: 2015-11-15 | DRG: 949 | Disposition: A | Discharge status: Home Health | Payer: BLUE CROSS/BLUE SHIELD | Source: Intra-hospital | Attending: Physical Medicine & Rehabilitation | Admitting: Physical Medicine & Rehabilitation

## 2015-10-18 DIAGNOSIS — K2100 Gastro-esophageal reflux disease with esophagitis, without bleeding: Secondary | ICD-10-CM | POA: Insufficient documentation

## 2015-10-18 DIAGNOSIS — F419 Anxiety disorder, unspecified: Secondary | ICD-10-CM | POA: Diagnosis present

## 2015-10-18 DIAGNOSIS — Z993 Dependence on wheelchair: Secondary | ICD-10-CM | POA: Diagnosis not present

## 2015-10-18 DIAGNOSIS — S14105A Unspecified injury at C5 level of cervical spinal cord, initial encounter: Secondary | ICD-10-CM | POA: Diagnosis present

## 2015-10-18 DIAGNOSIS — G8250 Quadriplegia, unspecified: Secondary | ICD-10-CM | POA: Diagnosis present

## 2015-10-18 DIAGNOSIS — N390 Urinary tract infection, site not specified: Secondary | ICD-10-CM | POA: Insufficient documentation

## 2015-10-18 DIAGNOSIS — B373 Candidiasis of vulva and vagina: Secondary | ICD-10-CM | POA: Diagnosis present

## 2015-10-18 DIAGNOSIS — R609 Edema, unspecified: Secondary | ICD-10-CM | POA: Diagnosis not present

## 2015-10-18 DIAGNOSIS — S14109S Unspecified injury at unspecified level of cervical spinal cord, sequela: Secondary | ICD-10-CM | POA: Diagnosis not present

## 2015-10-18 DIAGNOSIS — R058 Other specified cough: Secondary | ICD-10-CM | POA: Insufficient documentation

## 2015-10-18 DIAGNOSIS — B370 Candidal stomatitis: Secondary | ICD-10-CM | POA: Diagnosis present

## 2015-10-18 DIAGNOSIS — F4321 Adjustment disorder with depressed mood: Secondary | ICD-10-CM | POA: Insufficient documentation

## 2015-10-18 DIAGNOSIS — K219 Gastro-esophageal reflux disease without esophagitis: Secondary | ICD-10-CM | POA: Diagnosis present

## 2015-10-18 DIAGNOSIS — N319 Neuromuscular dysfunction of bladder, unspecified: Secondary | ICD-10-CM

## 2015-10-18 DIAGNOSIS — S14126D Central cord syndrome at C6 level of cervical spinal cord, subsequent encounter: Secondary | ICD-10-CM | POA: Diagnosis not present

## 2015-10-18 DIAGNOSIS — R059 Cough, unspecified: Secondary | ICD-10-CM

## 2015-10-18 DIAGNOSIS — Z981 Arthrodesis status: Secondary | ICD-10-CM

## 2015-10-18 DIAGNOSIS — K592 Neurogenic bowel, not elsewhere classified: Secondary | ICD-10-CM | POA: Diagnosis present

## 2015-10-18 DIAGNOSIS — W11XXXD Fall on and from ladder, subsequent encounter: Secondary | ICD-10-CM | POA: Diagnosis present

## 2015-10-18 DIAGNOSIS — M4802 Spinal stenosis, cervical region: Secondary | ICD-10-CM

## 2015-10-18 DIAGNOSIS — R05 Cough: Secondary | ICD-10-CM | POA: Insufficient documentation

## 2015-10-18 MED ORDER — ACETAMINOPHEN 325 MG PO TABS: 650.0000 mg | ORAL_TABLET | ORAL | Status: DC | PRN

## 2015-10-18 MED ORDER — DEXAMETHASONE 4 MG PO TABS
4.0000 mg | ORAL_TABLET | Freq: Three times a day (TID) | ORAL | Status: DC
  Administered 2015-10-18 – 2015-10-30 (×35): 4 mg via ORAL
  Filled 2015-10-18 (×35): qty 1

## 2015-10-18 MED ORDER — DULOXETINE HCL 30 MG PO CPEP
30.0000 mg | ORAL_CAPSULE | Freq: Every day | ORAL | Status: DC
  Administered 2015-10-19 – 2015-11-15 (×28): 30 mg via ORAL
  Filled 2015-10-18 (×29): qty 1

## 2015-10-18 MED ORDER — BISACODYL 5 MG PO TBEC
5.0000 mg | DELAYED_RELEASE_TABLET | Freq: Every day | ORAL | Status: DC | PRN
  Administered 2015-10-18 – 2015-10-20 (×2): 5 mg via ORAL
  Filled 2015-10-18 (×2): qty 1

## 2015-10-18 MED ORDER — SORBITOL 70 % SOLN
30.0000 mL | Freq: Every day | Status: DC | PRN
  Administered 2015-10-20 – 2015-11-08 (×2): 30 mL via ORAL
  Filled 2015-10-18 (×2): qty 30

## 2015-10-18 MED ORDER — METHOCARBAMOL 500 MG PO TABS
500.0000 mg | ORAL_TABLET | Freq: Four times a day (QID) | ORAL | Status: DC | PRN
  Administered 2015-10-19 – 2015-11-11 (×21): 500 mg via ORAL
  Filled 2015-10-18 (×23): qty 1

## 2015-10-18 MED ORDER — BACLOFEN 10 MG PO TABS
10.0000 mg | ORAL_TABLET | Freq: Three times a day (TID) | ORAL | Status: DC
  Administered 2015-10-18 – 2015-10-19 (×2): 10 mg via ORAL
  Filled 2015-10-18 (×2): qty 1

## 2015-10-18 MED ORDER — ONDANSETRON HCL 4 MG/2ML IJ SOLN: 4.0000 mg | Freq: Four times a day (QID) | INTRAMUSCULAR | Status: DC | PRN

## 2015-10-18 MED ORDER — ONDANSETRON HCL 4 MG PO TABS: 4.0000 mg | ORAL_TABLET | Freq: Four times a day (QID) | ORAL | Status: DC | PRN

## 2015-10-18 MED ORDER — ENSURE ENLIVE PO LIQD
237.0000 mL | Freq: Two times a day (BID) | ORAL | Status: DC
  Administered 2015-10-18: 237 mL via ORAL

## 2015-10-18 MED ORDER — ACETAMINOPHEN 650 MG RE SUPP: 650.0000 mg | RECTAL | Status: DC | PRN

## 2015-10-18 MED ORDER — DIAZEPAM 5 MG PO TABS
5.0000 mg | ORAL_TABLET | Freq: Every evening | ORAL | Status: AC | PRN
  Administered 2015-10-18 – 2015-11-06 (×18): 5 mg via ORAL
  Filled 2015-10-18 (×20): qty 1

## 2015-10-18 MED ORDER — HYDROCODONE-ACETAMINOPHEN 5-325 MG PO TABS
1.0000 | ORAL_TABLET | ORAL | Status: DC | PRN
  Administered 2015-10-18 – 2015-11-04 (×41): 2 via ORAL
  Administered 2015-11-04 – 2015-11-05 (×2): 1 via ORAL
  Administered 2015-11-05: 2 via ORAL
  Administered 2015-11-06: 1 via ORAL
  Administered 2015-11-06 – 2015-11-07 (×3): 2 via ORAL
  Administered 2015-11-08: 1 via ORAL
  Administered 2015-11-08 – 2015-11-13 (×10): 2 via ORAL
  Administered 2015-11-14: 1 via ORAL
  Administered 2015-11-14: 2 via ORAL
  Administered 2015-11-15: 1 via ORAL
  Filled 2015-10-18 (×2): qty 1
  Filled 2015-10-18: qty 2
  Filled 2015-10-18: qty 1
  Filled 2015-10-18 (×16): qty 2
  Filled 2015-10-18: qty 1
  Filled 2015-10-18 (×13): qty 2
  Filled 2015-10-18: qty 1
  Filled 2015-10-18 (×15): qty 2
  Filled 2015-10-18: qty 1
  Filled 2015-10-18 (×14): qty 2
  Filled 2015-10-18: qty 1
  Filled 2015-10-18 (×2): qty 2

## 2015-10-18 MED ORDER — PREGABALIN 75 MG PO CAPS
150.0000 mg | ORAL_CAPSULE | Freq: Every day | ORAL | Status: DC
  Administered 2015-10-18 – 2015-11-14 (×28): 150 mg via ORAL
  Filled 2015-10-18 (×28): qty 2

## 2015-10-18 MED ORDER — DOCUSATE SODIUM 100 MG PO CAPS
100.0000 mg | ORAL_CAPSULE | Freq: Two times a day (BID) | ORAL | Status: DC
  Administered 2015-10-18 – 2015-10-20 (×4): 100 mg via ORAL
  Filled 2015-10-18 (×4): qty 1

## 2015-10-18 MED ORDER — SENNA 8.6 MG PO TABS
1.0000 | ORAL_TABLET | Freq: Two times a day (BID) | ORAL | Status: DC
  Administered 2015-10-18 – 2015-10-20 (×4): 8.6 mg via ORAL
  Filled 2015-10-18 (×4): qty 1

## 2015-10-18 MED ORDER — CARISOPRODOL 350 MG PO TABS
350.0000 mg | ORAL_TABLET | Freq: Every day | ORAL | Status: DC
  Administered 2015-10-18: 350 mg via ORAL
  Filled 2015-10-18: qty 1

## 2015-10-18 NOTE — Progress Notes (Signed)
Initial Nutrition Assessment  DOCUMENTATION CODES:   Not applicable  INTERVENTION:  -Ensure Enlive BID. Each supplement provides 350 kcals and 20 grams of protein.  NUTRITION DIAGNOSIS:   Increased nutrient needs related to other (see comment) (s/p surgery) as evidenced by estimated needs.  GOAL:   Patient will meet greater than or equal to 90% of their needs  MONITOR:   PO intake, Supplement acceptance, Labs, Weight trends, Skin, I & O's  REASON FOR ASSESSMENT:   Consult Assessment of nutrition requirement/status  ASSESSMENT:   Pt with progressive severe neck pain radiating to left upper extremity since a fall from a ladder 09/13/2015. MRI showed cord signal at the disc space at C5-6 cord compression, a large osteophyte disc at that level, bilateral foraminal compromise.  Postoperatively lower extremity weakness. Emergent neck exploration. Significant allodynia with any movements.  Pt with poor PO's since admission.  Pt eating only jello and italian ice for 8 days. Pt reports poor intake d/t fear of bowel movements in bed. Pt states that is degrading. Pt started eating today (5/12). Pt ate 100% of breakfast and reports feeling hungry for lunch.  No N/V or abdominal pain.  Pt requests Ensure. Will order BID.  S/p surgery. Intern encouraged pt to eat to prevent muscle depletion during hospital stay.   Pt reports UBW of 140-145 lbs. Pt purposefully lost ~20 lbs in the past year. Correlates with weight hx in chart.  Suspect current weight from 5/5 of 151 lbs is incorrect. Pt weighed 142 lbs on 5/4.   NFPE: No muscle depletion, no fat depletion, no edema.   Labs and meds reviewed.   Diet Order:  Diet regular Room service appropriate?: Yes; Fluid consistency:: Thin  Skin:  Reviewed, no issues  Last BM:  5/3  Height:   Ht Readings from Last 1 Encounters:  10/11/15 5\' 1"  (1.549 m)    Weight:   Wt Readings from Last 1 Encounters:  10/11/15 151 lb 3.8 oz (68.6 kg)     Ideal Body Weight:  47.7 kg  BMI:  Body mass index is 28.59 kg/(m^2).  Estimated Nutritional Needs:   Kcal:  1610-96041715-1815  Protein:  90-100 g  Fluid:  1.7-1.9 L  EDUCATION NEEDS:   No education needs identified at this time  Beryle QuantMeredith Destinae Neubecker, MS [facility] Dietetic Intern Pager [phone]

## 2015-10-18 NOTE — Progress Notes (Signed)
Occupational Therapy Treatment Patient Details Name: Jasmine [name] MRN: [id number] DOB: 04/06/1976 Today's Date: 10/18/2015    History of present illness 10/10/15 s/p ACDF with post operative LE plegia. PTA, pt independent, lives with spouse and children   OT comments  Pt limited this session by orthostatic and could benefit from ted hose or ace wraps next session. Spouse present and very anxious about patients recovery. Spouse reports having 3 weeks off FMLA from work.   Follow Up Recommendations  CIR;Supervision/Assistance - 24 hour    Equipment Recommendations  3 in 1 bedside comode;Hospital bed;Wheelchair (measurements OT)    Recommendations for Other Services Rehab consult    Precautions / Restrictions Precautions Precautions: Cervical Precaution Comments: pain management Restrictions Weight Bearing Restrictions: No       Mobility Bed Mobility Overal bed mobility: Needs Assistance Bed Mobility: Rolling;Supine to Sit;Sit to Supine Rolling: Mod assist   Supine to sit: Max assist;+2 for physical assistance Sit to supine: Max assist;+2 for physical assistance   General bed mobility comments: patient able to utilize RUE to reach for bed rail and transition to her side  Transfers                      Balance   Sitting-balance support: Feet supported Sitting balance-Leahy Scale: Poor Sitting balance - Comments: patient became very diaphoretic, flushed, and symptomatic when EOB re: low BP. Assist out of sitting and back to supine                           ADL                                         General ADL Comments: pt greatly limited at this time by orthostatics. Pt with decr BP and map ( 55) with symptoms of ringing in ears, feeling "hot" and decr vision. pt with no symptoms once returned to supine. Pt with RN in room addressing IV and attempting to start bolus.Pt could benefit from ted hose or ace wraps      Vision                     Perception     Praxis      Cognition   Behavior During Therapy: [facility] for tasks assessed/performed Overall Cognitive Status: Within Functional Limits for tasks assessed                       Extremity/Trunk Assessment               Exercises General Exercises - Lower Extremity Ankle Circles/Pumps: PROM;Both;10 reps (AROM for plantarflexion) Hip Flexion/Marching: PROM;Both;10 reps Other Exercises Other Exercises: pt with developing tendoesis grasp on L UE. pt with splint don and skin checked by OT   Shoulder Instructions       General Comments      Pertinent Vitals/ Pain       Pain Assessment: Faces Faces Pain Scale: Hurts little more Pain Location: shoulders, LEs Pain Descriptors / Indicators: Burning Pain Intervention(s): Monitored during session;Repositioned  Home Living                                          Prior  Functioning/Environment              Frequency Min 3X/week     Progress Toward Goals  OT Goals(current goals can now be found in the care plan section)  Progress towards OT goals: Not progressing toward goals - comment  Acute Rehab OT Goals Patient Stated Goal: get back to normal  OT Goal Formulation: With patient/family Time For Goal Achievement: 10/28/15 Potential to Achieve Goals: Good ADL Goals Pt Will Perform Eating: with modified independence;with adaptive utensils;sitting;bed level Pt Will Perform Grooming: with min assist;sitting;with adaptive equipment Pt Will Perform Upper Body Bathing: with min assist;sitting Pt/caregiver will Perform Home Exercise Program: Increased ROM;Increased strength;Right Upper extremity;Left upper extremity;With written HEP provided Additional ADL Goal #1: Pt will tolerate sitting EOB x 15 mins with min A in prep for increased independence with ADLs  Additional ADL Goal #2: Pt and spouse will be independent with desensitization program bil. UEs   Plan  Discharge plan remains appropriate    Co-evaluation    PT/OT/SLP Co-Evaluation/Treatment: Yes Reason for Co-Treatment: Complexity of the patient's impairments (multi-system involvement);For patient/therapist safety PT goals addressed during session: Mobility/safety with mobility OT goals addressed during session: ADL's and self-care;Strengthening/ROM      End of Session     Activity Tolerance Other (comment) (limited by orthostatic)   Patient Left in bed;with call bell/phone within reach;with family/visitor present;with nursing/sitter in room   Nurse Communication Mobility status;Need for lift equipment;Precautions        Time: [id number] OT Time Calculation (min): 32 min  Charges: OT General Charges $OT Visit: 1 Procedure OT Treatments $Therapeutic Activity: 8-22 mins  Boone MasterJones, Leary Mcnulty B 10/18/2015, 3:26 PM   Mateo FlowJones, Brynn   OTR/L Pager: [phone] Office: [phone] .

## 2015-10-18 NOTE — Progress Notes (Signed)
Rehab admissions - I have approval for acute inpatient rehab admission for today.  I spoke with RN at the bedside.  She will check with Dr. Franky Machoabbell to be sure patient can admit to inpatient rehab today.  I do have a bed available on rehab today.  Call me for questions.  #[phone]
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
## 2015-10-18 NOTE — Care Management Note (Signed)
Case Management Note  Patient Details  Name: Willy EddyBrenda Parkinson MRN: [id number] Date of Birth: 11/08/1975  Subjective/Objective:   Pt medically stable for discharge today.                   Action/Plan: [facility].    Expected Discharge Date:    10/18/15              Expected Discharge Plan:  IP Rehab Facility  In-House Referral:  Clinical Social Work  Discharge planning Services  CM Consult  Post Acute Care Choice:    Choice offered to:     DME Arranged:    DME Agency:     HH Arranged:    HH Agency:     Status of Service:  Completed, signed off  Medicare Important Message Given:    Date Medicare IM Given:    Medicare IM give by:    Date Additional Medicare IM Given:    Additional Medicare Important Message give by:     If discussed at Long Length of Stay Meetings, dates discussed:    Additional Comments:  Quintella BatonJulie W. Alayna Mabe, RN, BSN  Trauma/Neuro ICU Case Manager [phone]

## 2015-10-18 NOTE — Interval H&P Note (Signed)
Jasmine EddyBrenda [name] was admitted today to Inpatient Rehabilitation with the diagnosis of cervical spinal cord injury.  The patient's history has been reviewed, patient examined, and there is no change in status.  Patient continues to be appropriate for intensive inpatient rehabilitation.  I have reviewed the patient's chart and labs.  Questions were answered to the patient's satisfaction. The PAPE has been reviewed and assessment remains appropriate.  Tori Dattilio T 10/18/2015, 9:04 PM

## 2015-10-18 NOTE — Progress Notes (Signed)
Patient admitted to unit in stable condition. [facility] [place]. Further, detailed assessment per flow sheet, to follow. Patient alert and oriented, able to make needs known. Son at bedside. SCDs in place and working appropriately. Foot drop braces to bilateral feet, intact. Foley cath to gravity draining concentrated, medium yellow urine. Patient states she has "burning" and "pins/needles" to BLE. Brace to left hand to prevent contractures. Admission education provided. Patient and family will need reinforcement and on-going education, as SCI was an outcome unexpected by patient and/or family.
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
## 2015-10-18 NOTE — Progress Notes (Signed)
Physical Therapy Treatment Patient Details Name: Jasmine [name] MRN: [id number] DOB: April 19, 1976 Today's Date: 10/18/2015    History of Present Illness 10/10/15 s/p ACDF with post operative LE plegia. PTA, pt independent, lives with spouse and children    PT Comments    Patient seen for activity progression. Tolerated some exercises and ROM. Performed bed mobility with improved contribution. At EOB, patient did become orthostatic with symptoms, nsg aware. OF NOTE: session performed immediately after medication received which may have contributed. Returned to bed and addressed questions regarding mobility and expectations for discharge to rehab unit. Patient and spouse receptive and appreciative.   Follow Up Recommendations  CIR     Equipment Recommendations  Other (comment) (TBD)    Recommendations for Other Services Rehab consult     Precautions / Restrictions Precautions Precautions: Cervical Precaution Comments: pain management Restrictions Weight Bearing Restrictions: No    Mobility  Bed Mobility Overal bed mobility: Needs Assistance Bed Mobility: Rolling;Supine to Sit;Sit to Supine Rolling: Mod assist   Supine to sit: Max assist;+2 for physical assistance Sit to supine: Max assist;+2 for physical assistance   General bed mobility comments: patient able to utilize RUE to reach for bed rail and transition to her side  Transfers                    Ambulation/Gait                 Stairs            Wheelchair Mobility    Modified Rankin (Stroke Patients Only)       Balance   Sitting-balance support: Feet supported Sitting balance-Leahy Scale: Poor Sitting balance - Comments: patient became very diaphoretic, flushed, and symptomatic when EOB re: low BP. Assist out of sitting and back to supine                            Cognition Arousal/Alertness: Awake/alert Behavior During Therapy: WFL for tasks  assessed/performed Overall Cognitive Status: Within Functional Limits for tasks assessed                      Exercises General Exercises - Lower Extremity Ankle Circles/Pumps: PROM;Both;10 reps (AROM for plantarflexion) Hip Flexion/Marching: PROM;Both;10 reps    General Comments        Pertinent Vitals/Pain Pain Assessment: Faces Faces Pain Scale: Hurts little more Pain Location: shoulders, LEs Pain Descriptors / Indicators: Burning;Constant;Numbness Pain Intervention(s): Monitored during session;Repositioned    Home Living                      Prior Function            PT Goals (current goals can now be found in the care plan section) Acute Rehab PT Goals Patient Stated Goal: get back to normal  PT Goal Formulation: With patient/family Time For Goal Achievement: 10/25/15 Potential to Achieve Goals: Good Progress towards PT goals: Progressing toward goals    Frequency  Min 4X/week    PT Plan Current plan remains appropriate    Co-evaluation PT/OT/SLP Co-Evaluation/Treatment: Yes Reason for Co-Treatment: Complexity of the patient's impairments (multi-system involvement) PT goals addressed during session: Mobility/safety with mobility       End of Session   Activity Tolerance: Treatment limited secondary to medical complications (Comment) (low BP upon EOB) Patient left: in bed;with call bell/phone within reach;with family/visitor present     Time:  [id number] PT Time Calculation (min) (ACUTE ONLY): 27 min  Charges:  $Therapeutic Activity: 8-22 mins                    G CodesFabio Asa:      Jasmine [name] J 10/18/2015, 2:58 PM Charlotte Crumbevon Alexes Lamarque, PT DPT  [phone]

## 2015-10-18 NOTE — Discharge Summary (Signed)
  Physician Discharge Summary  Patient ID: Jasmine [name] MRN: [id number] DOB/AGE: [birth year] 40 y.o.  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 She underwent an uneventful surgery which entailed a 2 level decompression with allograft and plating. She once extubated and awake was unable to move her lower extremities. I took her back to the OR emergently, and explored the operative site. There was no hematoma, and the surgical site looked good. She then underwent a full MRI of the cervical spine, which showed the altered spinal cord signal. This was present preop, but did not seem to be as prominent. However the cord signal was not more extensive nor did it involve other levels.  Over the past week she has improved and is able to move her lower extremities. Her left side is weaker than the right, as is the case in the upper extremities where her right hand is moving better than the left. Her wound is clean, dry, and without signs of infection. Jasmine [name] will be transferred to rehab today. She has had fairly severe spasms in the lower extremities, and I have currently placed her on Baclofen. Tizanidine had not been helpful. I have also started Cymbalta for the neuropathic pain she experiences post op.   Treatments: surgery: as above  Discharge Exam: Blood pressure 120/69, pulse 54, temperature 98.2 F (36.8 C), temperature source Oral, resp. rate 15, height 5\' 1"  (1.549 m),  weight 68.6 kg (151 lb 3.8 oz), last menstrual period 09/19/2015, SpO2 97 %. as above  Disposition: 01-Home or Self Care Post-anterior cervical decompression    Medication List    TAKE these medications        carisoprodol 350 MG tablet  Commonly known as:  SOMA  Take 1 tablet (350 mg total) by mouth 3 (three) times daily as needed for muscle spasms.     pregabalin 50 MG capsule  Commonly known as:  LYRICA  Take 150 mg by mouth at bedtime.     SPRINTEC 28 0.25-35 MG-MCG tablet  Generic drug:  norgestimate-ethinyl estradiol  TAKE 1 TABLET BY MOUTH ONCE DAILY at bedtime FOR 28 DAYS     Turmeric Curcumin 500 MG Caps  Take 2 capsules by mouth at bedtime.     zolpidem 5 MG tablet  Commonly known as:  AMBIEN  Take 1 tablet (5 mg total) by mouth at bedtime as needed for sleep.         Signed: Lottie Sigman L 10/18/2015, 1:50 PM

## 2015-10-18 NOTE — Discharge Instructions (Signed)

## 2015-10-19 ENCOUNTER — Inpatient Hospital Stay (HOSPITAL_COMMUNITY): Payer: BLUE CROSS/BLUE SHIELD | Admitting: Physical Therapy

## 2015-10-19 ENCOUNTER — Inpatient Hospital Stay (HOSPITAL_COMMUNITY): Payer: BLUE CROSS/BLUE SHIELD | Admitting: Occupational Therapy

## 2015-10-19 MED ORDER — ENOXAPARIN SODIUM 30 MG/0.3ML ~~LOC~~ SOLN
30.0000 mg | Freq: Two times a day (BID) | SUBCUTANEOUS | Status: DC
  Administered 2015-10-19 – 2015-11-13 (×52): 30 mg via SUBCUTANEOUS
  Filled 2015-10-19 (×51): qty 0.3

## 2015-10-19 MED ORDER — BACLOFEN 10 MG PO TABS
15.0000 mg | ORAL_TABLET | Freq: Three times a day (TID) | ORAL | Status: DC
  Administered 2015-10-19 – 2015-11-15 (×79): 15 mg via ORAL
  Filled 2015-10-19 (×81): qty 1

## 2015-10-19 NOTE — Progress Notes (Signed)
Patient rested off and on throughout the night. Turned and repositioned every 2-3 hours per patient preference. Foot drop braces in place, as well as SCDs. Extensive education regarding medications and SCI completed throughout the shift with both patient and husband. Will need reinforcement, but able to verbalize understanding. Patient expresses feeling several emotions regarding situation. Both she and her husband voice being shocked over her not being able to walk following elective "every day" procedure. Patient expresses being fearful, saddened, upset and optimistic all at the same time. Emotional support given. No acute distress noted. Will continue to monitor until end of shift.
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
## 2015-10-19 NOTE — Progress Notes (Signed)
Physical Therapy Session Note  Patient Details  Name: Jasmine EddyBrenda Ealy MRN: [id number] Date of Birth: 04/30/1976  Today's Date: 10/19/2015 PT Individual Time: 1330-1415 PT Individual Time Calculation (min): 45 min   Short Term Goals: Week 1:  PT Short Term Goal 1 (Week 1): Pt will increase rolling R/L with side rails and mod A.  PT Short Term Goal 2 (Week 1): Pt will increase supine to edge of bed, edge of bed to supine to mod A and verbal cues.  PT Short Term Goal 3 (Week 1): Pt will increase transfers bed to chair to max A.  PT Short Term Goal 4 (Week 1): Pt will increase w/c mobility to min A about 50 feet.  PT Short Term Goal 5 (Week 1): Pt will ambulate about 10 feet with +2 max A.   Skilled Therapeutic Interventions/Progress Updates:  Pt was seen at the end of OT treatment. Pt up in tilt in space w/c. Pt transported to rehab gym. Pt transferred w/c to mat with sliding board abd +2 assist for safety. Pt transferred edge of mat to supine with max to total A. Pt transferred supine to edge of mat with +2 assist secondary to fatigue and extensor tone. Pt transferred edge of mat to w/c with sliding board and +2 assist. Pt returned to room and left sitting up in w/c with call bell within reach.   Therapy Documentation Precautions:  Precautions Precautions: Cervical Precaution Comments: orthostatic BPs Restrictions Weight Bearing Restrictions: No General: Chart Reviewed: Yes Family/Caregiver Present: Yes Vital Signs:  Pain: Pt c/o mod to severe pain all over with mobility.   See Function Navigator for Current Functional Status.   Therapy/Group: Individual Therapy  Rayford HalstedMitchell, Adya Wirz G 10/19/2015, 4:36 PM

## 2015-10-19 NOTE — Progress Notes (Signed)
[place] PHYSICAL MEDICINE & REHABILITATION     PROGRESS NOTE    Subjective/Complaints: Couldn't get comfortable in bed. Very restless. Continued spasms. SCD's were malfunctioning also. Some anxiety at times  ROS: Pt denies fever, rash/itching, headache, blurred or double vision, nausea, vomiting, abdominal pain, diarrhea, chest pain, shortness of breath, palpitations, dysuria, dizziness,  bleeding, or depression   Objective: Vital Signs: Blood pressure 123/62, pulse 67, temperature 98.4 F (36.9 C), temperature source Oral, resp. rate 18, weight 68.675 kg (151 lb 6.4 oz), last menstrual period 09/19/2015, SpO2 96 %. No results found. No results for input(s): WBC, HGB, HCT, PLT in the last 72 hours. No results for input(s): NA, K, CL, GLUCOSE, BUN, CREATININE, CALCIUM in the last 72 hours.  Invalid input(s): CO CBG (last 3)  No results for input(s): GLUCAP in the last 72 hours.  Wt Readings from Last 3 Encounters:  10/19/15 68.675 kg (151 lb 6.4 oz)  10/11/15 68.6 kg (151 lb 3.8 oz)  09/18/15 64.411 kg (142 lb)    Physical Exam:  Constitutional: She is oriented to person, place, and time. She appears well-developed and well-nourished.  HENT: dentition normal, mucosa pink and moist Head: Normocephalic.  Right Ear: External ear normal.  Left Ear: External ear normal.  Eyes: Conjunctivae and EOM are normal.  Neck: Normal range of motion. Neck supple. No thyromegaly present.  Cardiovascular: Normal rate and regular rhythm.  Respiratory: Effort normal and breath sounds normal. No respiratory distress.  GI: Soft. Bowel sounds are normal. She exhibits no distension.  Musculoskeletal: She exhibits edema and tenderness.  Neurological: She is alert and oriented to person, place, and time.  DTRs symmetric in bilateral upper extremities, 3+ in lowers etensor tone 3/4 bilateral lower exts, left leg tender Sensation decreased wrists bilaterally left more than right. Motor:  Right upper extremity: delt 5/5, bicep 5/5, tricep 4/5, wrist 4/5, HI 3.  Left upper extremity: Shoulder abduction, elbow flexion 4+ to 5/5, elbow extension 1/5, wrist extension 2/5, , finger grip 1/5 Left lower extremity 0/5 Right lower extremity: HF and KE 1 to 1+/5, ADF/PF tr to 1/5---NO CHANGES Skin:  Neck incision clean and dry anterior neck.  Psychiatric: Her speech is normal. Thought content normal. Her mood is appropriate  Assessment/Plan: 1. 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 Physiatrist is providing close team supervision and 24 hour management of active medical problems listed below. Physiatrist and rehab team continue to assess barriers to discharge/monitor patient progress toward functional and medical goals.  Function:  Bathing Bathing position      Bathing parts      Bathing assist        Upper Body Dressing/Undressing Upper body dressing                    Upper body assist        Lower Body Dressing/Undressing Lower body dressing                                  Lower body assist        Toileting Toileting Toileting activity did not occur: Safety/medical concerns        Toileting assist     Transfers Chair/bed transfer             Locomotion Ambulation  Wheelchair          Cognition Comprehension Comprehension assist level: Follows complex conversation/direction with no assist  Expression Expression assist level: Expresses complex ideas: With no assist  Social Interaction Social Interaction assist level: Interacts appropriately with others - No medications needed.  Problem Solving Problem solving assist level: Solves complex problems: Recognizes & self-corrects  Memory Memory assist level: Complete Independence: No helper   Medical Problem List and Plan: 1. 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 Status post ACDF C4-5, 5-6.     -begin CIR therapies 2. DVT Prophylaxis/Anticoagulation: SCDs. Monitor for any signs of DVT. Check vascular study  -initiate sq lovenox 30 q12 3. Pain Management: Baclofen 10 mg 3 times a day--titrate to 15mg . Valium qhs prn  -Lyrica 150 mg daily at bedtime, Soma 350 mg daily at bedtime, Cymbalta 30 mg daily. Hydrocodone and Robaxin as needed. 4. Mood: team to provide ego support, SW to follow up 5. Neuropsych: This patient is capable of making decisions on her own behalf. 6. Skin/Wound Care: Routine skin checks  -specialty air mattress 7. Fluids/Electrolytes/Nutrition: Routine I&O's with follow-up chemistries 8. Neurogenic bowel and bladder. Discontinue Foley tube check PVRs 3. Adjust bowel program LOS (Days) 1 A FACE TO FACE EVALUATION WAS PERFORMED  Kory Rains T 10/19/2015 10:13 AM

## 2015-10-20 ENCOUNTER — Inpatient Hospital Stay (HOSPITAL_COMMUNITY): Payer: BLUE CROSS/BLUE SHIELD | Admitting: Occupational Therapy

## 2015-10-20 ENCOUNTER — Inpatient Hospital Stay (HOSPITAL_COMMUNITY): Payer: BLUE CROSS/BLUE SHIELD | Admitting: Physical Therapy

## 2015-10-20 ENCOUNTER — Inpatient Hospital Stay (HOSPITAL_COMMUNITY): Payer: BLUE CROSS/BLUE SHIELD

## 2015-10-20 DIAGNOSIS — R609 Edema, unspecified: Secondary | ICD-10-CM

## 2015-10-20 MED ORDER — SENNOSIDES-DOCUSATE SODIUM 8.6-50 MG PO TABS
2.0000 | ORAL_TABLET | Freq: Every day | ORAL | Status: DC
  Administered 2015-10-20 – 2015-10-29 (×10): 2 via ORAL
  Filled 2015-10-20 (×10): qty 2

## 2015-10-20 NOTE — Progress Notes (Signed)
RN educated on a bowel program. Pt says her regular schedule is q3days. Pt prefers an evening bowel program. RN did digital stimulation and removed a small amount of hard stool. Will continue to monitor. Rudie MeyerEurillo, Hudsen Fei A, RN

## 2015-10-20 NOTE — Progress Notes (Signed)
VASCULAR LAB PRELIMINARY  PRELIMINARY  PRELIMINARY  PRELIMINARY  Bilateral lower extremity venous duplex  completed.    Preliminary report:  Bilateral:  No evidence of DVT, superficial thrombosis, or Baker's Cyst.    Tane Biegler, RVT 10/20/2015, 11:59 AM

## 2015-10-20 NOTE — Progress Notes (Signed)
Dr. Riley KillSwartz ordered to keep foley in and we will re-evaluate this week. Rudie MeyerEurillo, Idella Lamontagne A, RN

## 2015-10-20 NOTE — Plan of Care (Signed)
Problem: SCI BLADDER ELIMINATION Goal: RH STG MANAGE BLADDER WITH ASSISTANCE STG Manage Bladder With mod Assistance  Outcome: Not Progressing Foley cath in place for neurogenic bladder

## 2015-10-20 NOTE — Progress Notes (Signed)
[facility] PHYSICAL MEDICINE & REHABILITATION     PROGRESS NOTE    Subjective/Complaints: Had a busy day with therapy. Noticed more sensation and spasms in back last night. SCD's still giving her trouble. Was restless still last night but did better in air mattress.   ROS: Pt denies fever, rash/itching, headache, blurred or double vision, nausea, vomiting, abdominal pain, diarrhea, chest pain, shortness of breath, palpitations, dysuria, dizziness,  bleeding, or depression   Objective: Vital Signs: Blood pressure 112/64, pulse 69, temperature 98.3 F (36.8 C), temperature source Oral, resp. rate 18, weight 68.675 kg (151 lb 6.4 oz), last menstrual period 09/19/2015, SpO2 97 %. No results found. No results for input(s): WBC, HGB, HCT, PLT in the last 72 hours. No results for input(s): NA, K, CL, GLUCOSE, BUN, CREATININE, CALCIUM in the last 72 hours.  Invalid input(s): CO CBG (last 3)  No results for input(s): GLUCAP in the last 72 hours.  Wt Readings from Last 3 Encounters:  10/19/15 68.675 kg (151 lb 6.4 oz)  10/11/15 68.6 kg (151 lb 3.8 oz)  09/18/15 64.411 kg (142 lb)    Physical Exam:  Constitutional: She is oriented to person, place, and time. She appears well-developed and well-nourished.  HENT: dentition normal, mucosa pink and moist Head: Normocephalic.  Right Ear: External ear normal.  Left Ear: External ear normal.  Eyes: Conjunctivae and EOM are normal.  Neck: Normal range of motion. Neck supple. No thyromegaly present.  Cardiovascular: Normal rate and regular rhythm.  Respiratory: Effort normal and breath sounds normal. No respiratory distress.  GI: Soft. Bowel sounds are normal. She exhibits no distension.  Musculoskeletal: She exhibits edema and tenderness.  Neurological: She is alert and oriented to person, place, and time.  DTRs symmetric in bilateral upper extremities, 3+ in lowers etensor tone 3/4 bilateral lower exts, left leg tender Sensation  decreased wrists bilaterally left more than right. Motor: Right upper extremity: delt 5/5, bicep 5/5, tricep 4/5, wrist 4/5, HI 3.  Left upper extremity: Shoulder abduction, elbow flexion 4+ to 5/5, elbow extension 1/5, wrist extension 2/5, , finger grip 1/5 Left lower extremity 0/5 Right lower extremity: HF and KE 1 to 1+/5, ADF/PF tr to 1/5---NO CHANGES Skin:  Neck incision clean and dry anterior neck.  Psychiatric: Her speech is normal. Thought content normal. Her mood is appropriate  Assessment/Plan: 1. 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 Physiatrist is providing close team supervision and 24 hour management of active medical problems listed below. Physiatrist and rehab team continue to assess barriers to discharge/monitor patient progress toward functional and medical goals.  Function:  Bathing Bathing position   Position: Bed  Bathing parts Body parts bathed by patient: Left arm, Chest, Abdomen, Front perineal area, Right upper leg, Left upper leg Body parts bathed by helper: Right arm, Buttocks, Right lower leg, Left lower leg, Back  Bathing assist Assist Level: 2 helpers      Upper Body Dressing/Undressing Upper body dressing   What is the patient wearing?: Pull over shirt/dress     Pull over shirt/dress - Perfomed by patient: Thread/unthread right sleeve Pull over shirt/dress - Perfomed by helper: Thread/unthread left sleeve, Put head through opening, Pull shirt over trunk        Upper body assist Assist Level:  (max a)      Lower Body Dressing/Undressing Lower body dressing   What is the patient wearing?: Pants,  Non-skid slipper socks       Pants- Performed by helper: Thread/unthread right pants leg, Thread/unthread left pants leg, Pull pants up/down   Non-skid slipper socks- Performed by helper: Don/doff right sock, Don/doff left  sock                  Lower body assist Assist for lower body dressing: 2 Helpers      Toileting Toileting Toileting activity did not occur: Safety/medical concerns        Toileting assist     Transfers Chair/bed transfer Chair/bed transfer activity did not occur: Safety/medical concerns Chair/bed transfer method: Lateral scoot Chair/bed transfer assist level: 2 helpers Chair/bed transfer assistive device: Sliding board     Locomotion Ambulation Ambulation activity did not occur: Safety/medical Investment banker, operationalconcerns         Wheelchair Wheelchair activity did not occur: Safety/medical concerns        Cognition Comprehension Comprehension assist level: Follows complex conversation/direction with no assist  Expression Expression assist level: Expresses complex ideas: With no assist  Social Interaction Social Interaction assist level: Interacts appropriately with others - No medications needed.  Problem Solving Problem solving assist level: Solves complex problems: Recognizes & self-corrects  Memory Memory assist level: Complete Independence: No helper   Medical Problem List and Plan: 1. 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 Status post ACDF C4-5, 5-6.   -pt/husband continue with a lot of anxiety and questions regarding the injury and recovery  -begin CIR therapies 2. DVT Prophylaxis/Anticoagulation: SCDs.    vascular study pending -initiated sq lovenox 30 q12 3. Pain Management: Baclofen 10 mg 3 times a day--titrated to 15mg . Valium qhs prn  -Lyrica 150 mg daily at bedtime, Soma 350 mg daily at bedtime, Cymbalta 30 mg daily. Hydrocodone and Robaxin as needed.  -educated pt on recovery/spasticity related to SCI 4. Mood: team to provide ego support, SW to follow up 5. Neuropsych: This patient is capable of making decisions on her own behalf. 6. Skin/Wound Care: Routine skin  checks  -specialty air mattress in place 7. Fluids/Electrolytes/Nutrition: Routine I&O's with follow-up chemistries 8. Neurogenic bowel and bladder. . Will need bowel/bladder program  - continue foley for now.   -add hs stool softener for potential AM program   LOS (Days) 2 A FACE TO FACE EVALUATION WAS PERFORMED  Jasmine [name] T 10/20/2015 9:25 AM

## 2015-10-20 NOTE — Progress Notes (Signed)
Physical Therapy Session Note  Patient Details  Name: Jasmine EddyBrenda Ohagan MRN: [id number] Date of Birth: 22-Jun-1975  Today's Date: 10/20/2015 PT Individual Time: 1430-1500 PT Individual Time Calculation (min): 30 min   Short Term Goals: Week 1:  PT Short Term Goal 1 (Week 1): Pt will increase rolling R/L with side rails and mod A.  PT Short Term Goal 2 (Week 1): Pt will increase supine to edge of bed, edge of bed to supine to mod A and verbal cues.  PT Short Term Goal 3 (Week 1): Pt will increase transfers bed to chair to max A.  PT Short Term Goal 4 (Week 1): Pt will increase w/c mobility to min A about 50 feet.  PT Short Term Goal 5 (Week 1): Pt will ambulate about 10 feet with +2 max A.   Skilled Therapeutic Interventions/Progress Updates:  Pt was seen bedside in the pm. Donned knee high ted hose and ace wraps to knees B. Pt rolled into R sidelying with max A and verbal cues. Pt transferred to edge of bed with + 2 assist for safety. Pt tolerated edge of bed about 10 minutes with c/s to mod A and verbal cues with B UE support. While on edge of bed treatment focused on NMR to include sitting balance and finding midline. Pt returned to supine with +2 assist. Pt moved up in bed with total A x 2. Doffed ace wraps B. Pt left sitting up in bed with call bell within reach.   Therapy Documentation Precautions:  Precautions Precautions: Cervical Precaution Comments: orthostatic BPs Restrictions Weight Bearing Restrictions: No General:   Pain: Pt c/o mod to severe neuropathic pain with movement.  See Function Navigator for Current Functional Status.   Therapy/Group: Individual Therapy  Rayford HalstedMitchell, Tyheem Boughner G 10/20/2015, 3:29 PM

## 2015-10-21 ENCOUNTER — Inpatient Hospital Stay (HOSPITAL_COMMUNITY): Payer: BLUE CROSS/BLUE SHIELD

## 2015-10-21 ENCOUNTER — Inpatient Hospital Stay (HOSPITAL_COMMUNITY): Payer: BLUE CROSS/BLUE SHIELD | Admitting: Occupational Therapy

## 2015-10-21 DIAGNOSIS — K592 Neurogenic bowel, not elsewhere classified: Secondary | ICD-10-CM

## 2015-10-21 DIAGNOSIS — N319 Neuromuscular dysfunction of bladder, unspecified: Secondary | ICD-10-CM

## 2015-10-21 LAB — COMPREHENSIVE METABOLIC PANEL
ALT: 183 U/L — ABNORMAL HIGH (ref 14–54)
AST: 37 U/L (ref 15–41)
Albumin: 3.4 g/dL — ABNORMAL LOW (ref 3.5–5.0)
Alkaline Phosphatase: 65 U/L (ref 38–126)
Anion gap: 10 (ref 5–15)
BILIRUBIN TOTAL: 0.8 mg/dL (ref 0.3–1.2)
BUN: 24 mg/dL — ABNORMAL HIGH (ref 6–20)
CALCIUM: 9.1 mg/dL (ref 8.9–10.3)
CO2: 24 mmol/L (ref 22–32)
CREATININE: 0.56 mg/dL (ref 0.44–1.00)
Chloride: 100 mmol/L — ABNORMAL LOW (ref 101–111)
GLUCOSE: 129 mg/dL — AB (ref 65–99)
POTASSIUM: 4.4 mmol/L (ref 3.5–5.1)
Sodium: 134 mmol/L — ABNORMAL LOW (ref 135–145)
TOTAL PROTEIN: 6.9 g/dL (ref 6.5–8.1)

## 2015-10-21 LAB — CBC WITH DIFFERENTIAL/PLATELET
Basophils Absolute: 0 10*3/uL (ref 0.0–0.1)
Basophils Relative: 0 %
EOS PCT: 0 %
Eosinophils Absolute: 0 10*3/uL (ref 0.0–0.7)
HEMATOCRIT: 43.6 % (ref 36.0–46.0)
Hemoglobin: 14.5 g/dL (ref 12.0–15.0)
LYMPHS PCT: 8 %
Lymphs Abs: 1.3 10*3/uL (ref 0.7–4.0)
MCH: 28.2 pg (ref 26.0–34.0)
MCHC: 33.3 g/dL (ref 30.0–36.0)
MCV: 84.7 fL (ref 78.0–100.0)
MONO ABS: 0.5 10*3/uL (ref 0.1–1.0)
MONOS PCT: 3 %
NEUTROS PCT: 89 %
Neutro Abs: 15.8 10*3/uL — ABNORMAL HIGH (ref 1.7–7.7)
PLATELETS: 360 10*3/uL (ref 150–400)
RBC: 5.15 MIL/uL — ABNORMAL HIGH (ref 3.87–5.11)
RDW: 13.3 % (ref 11.5–15.5)
WBC: 17.6 10*3/uL — AB (ref 4.0–10.5)

## 2015-10-21 MED ORDER — SORBITOL 70 % SOLN
60.0000 mL | Status: AC
  Administered 2015-10-21: 60 mL via ORAL
  Filled 2015-10-21: qty 60

## 2015-10-21 NOTE — Progress Notes (Addendum)
[facility] PHYSICAL MEDICINE & REHABILITATION     PROGRESS NOTE    Subjective/Complaints: Had problems with mattress last night---deflated!! Seems to be working at present. Feeling more tingling on left side. ?moving left foot?  ROS: Pt denies fever, rash/itching, headache, blurred or double vision, nausea, vomiting, abdominal pain, diarrhea, chest pain, shortness of breath, palpitations, dysuria, dizziness,  bleeding, or depression   Objective: Vital Signs: Blood pressure 123/72, pulse 61, temperature 97.5 F (36.4 C), temperature source Oral, resp. rate 18, weight 68.675 kg (151 lb 6.4 oz), last menstrual period 09/19/2015, SpO2 97 %. No results found.  Recent Labs  10/21/15 0827  WBC 17.6*  HGB 14.5  HCT 43.6  PLT 360   No results for input(s): NA, K, CL, GLUCOSE, BUN, CREATININE, CALCIUM in the last 72 hours.  Invalid input(s): CO CBG (last 3)  No results for input(s): GLUCAP in the last 72 hours.  Wt Readings from Last 3 Encounters:  10/19/15 68.675 kg (151 lb 6.4 oz)  10/11/15 68.6 kg (151 lb 3.8 oz)  09/18/15 64.411 kg (142 lb)    Physical Exam:  Constitutional: She is oriented to person, place, and time. She appears well-developed and well-nourished.  HENT: dentition normal, mucosa pink and moist Head: Normocephalic.  Right Ear: External ear normal.  Left Ear: External ear normal.  Eyes: Conjunctivae and EOM are normal.  Neck: Normal range of motion. Neck supple. No thyromegaly present.  Cardiovascular: Normal rate and regular rhythm.  Respiratory: Effort normal and breath sounds normal. No respiratory distress.  GI: Soft. Bowel sounds are normal. She exhibits no distension.  Musculoskeletal: She exhibits edema and tenderness.  Neurological: She is alert and oriented to person, place, and time.  DTRs symmetric in bilateral upper extremities, 3+ in lowers etensor tone 3/4 bilateral lower exts, left leg tender Sensation decreased wrists bilaterally  left more than right. Motor: Right upper extremity: delt 5/5, bicep 5/5, tricep 4/5, wrist 4/5, HI 3.  Left upper extremity: Shoulder abduction, elbow flexion 4+ to 5/5, elbow extension 1/5, wrist extension 2/5, , finger grip 1/5 Left lower extremity---I see trace APF/ADF today Right lower extremity: HF and KE 1 to 1+/5, ADF/PF tr to 1/5---NO CHANGES Skin:  Neck incision clean and dry anterior neck.  Psychiatric: Her speech is normal. Thought content normal. Her mood is appropriate  Assessment/Plan: 1. 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 Physiatrist is providing close team supervision and 24 hour management of active medical problems listed below. Physiatrist and rehab team continue to assess barriers to discharge/monitor patient progress toward functional and medical goals.  Function:  Bathing Bathing position   Position: Bed  Bathing parts Body parts bathed by patient: Left arm, Chest, Abdomen, Front perineal area, Right upper leg, Left upper leg Body parts bathed by helper: Right arm, Buttocks, Right lower leg, Left lower leg, Back  Bathing assist Assist Level: 2 helpers      Upper Body Dressing/Undressing Upper body dressing   What is the patient wearing?: Pull over shirt/dress     Pull over shirt/dress - Perfomed by patient: Thread/unthread right sleeve Pull over shirt/dress - Perfomed by helper: Thread/unthread left sleeve, Put head through opening, Pull shirt over trunk        Upper body assist Assist Level:  (max a)      Lower Body Dressing/Undressing Lower body dressing   What is the patient wearing?: Pants, Non-skid  slipper socks       Pants- Performed by helper: Thread/unthread right pants leg, Thread/unthread left pants leg, Pull pants up/down   Non-skid slipper socks- Performed by helper: Don/doff right sock, Don/doff left sock                   Lower body assist Assist for lower body dressing: 2 Helpers      Toileting Toileting Toileting activity did not occur: Safety/medical concerns        Toileting assist     Transfers Chair/bed transfer Chair/bed transfer activity did not occur: Safety/medical concerns Chair/bed transfer method: Lateral scoot Chair/bed transfer assist level: 2 helpers Chair/bed transfer assistive device: Sliding board     Locomotion Ambulation Ambulation activity did not occur: Safety/medical Investment banker, operational activity did not occur: Safety/medical concerns        Cognition Comprehension Comprehension assist level: Follows complex conversation/direction with no assist  Expression Expression assist level: Expresses complex ideas: With no assist  Social Interaction Social Interaction assist level: Interacts appropriately with others - No medications needed.  Problem Solving Problem solving assist level: Solves complex problems: Recognizes & self-corrects  Memory Memory assist level: Complete Independence: No helper   Medical Problem List and Plan: 1. 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 Status post ACDF C4-5, 5-6.   -pt/husband continue with a lot of anxiety and questions regarding the injury and recovery  -begin CIR therapies 2. DVT Prophylaxis/Anticoagulation: SCDs.    vascular study pending -initiated sq lovenox 30 q12 3. Pain Management: Baclofen 10 mg 3 times a day--titrated to  on Saturday. Valium qhs prn  -Lyrica 150 mg daily at bedtime, Soma 350 mg daily at bedtime, Cymbalta 30 mg daily. Hydrocodone and Robaxin as needed.  -educated pt on recovery/spasticity related to SCI 4. Mood: team to provide ego support, SW to follow up 5. Neuropsych: This patient is capable of making decisions on her own behalf. 6. Skin/Wound Care: Routine skin  checks  -specialty air mattress in place 7. Fluids/Electrolytes/Nutrition: Routine I&O's with follow-up chemistries pending today. 8. Neurogenic bowel and bladder. . Will need bowel/bladder program  - continue foley for now---remove later this week  -added hs stool softener/lax for potential AM program  -sorbitol and enema today if needed 9. Leukocytosis due to steroids.    LOS (Days) 3 A FACE TO FACE EVALUATION WAS PERFORMED  SWARTZ,ZACHARY T 10/21/2015 9:07 AM
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
## 2015-10-21 NOTE — IPOC Note (Signed)
Overall Plan of Care 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 spinal bone (HCC) Active Problems:   Spastic tetraplegia (HCC)   Neurogenic bowel   Neurogenic bladder     Functional Problem List: Nursing Bladder, Bowel, Edema, Endurance, Medication Management, Motor, Nutrition, Pain, Perception, Safety, Sensory, Skin Integrity  PT Balance, Endurance, Motor  OT Balance, Endurance, Motor, Pain, Safety, Sensory, Skin Integrity  SLP    TR         Basic ADL's: OT Eating, Grooming, Bathing, Dressing, Toileting     Advanced  ADL's: OT       Transfers: PT Bed Mobility, Bed to Chair, Car  OT Toilet     Locomotion: PT Ambulation, Psychologist, prison and probation servicesWheelchair Mobility, Stairs     Additional Impairments: OT Fuctional Use of Upper Extremity  SLP        TR      Anticipated Outcomes Item Anticipated Outcome  Self Feeding min A  Swallowing      Basic self-care  min A overall  Toileting  min A overall   Bathroom Transfers min A - toielting  Bowel/Bladder  Pt will manage bowel and bladder with mod assist   Transfers  min A for transfers  Locomotion  min A for gait, mod I w/c mobility, mod A for stairs  Communication     Cognition     Pain  Pt will manage pain at 4 or less on a scale of 0-10.   Safety/Judgment  Pt will remain free of falls and injury with min assist    Therapy Plan: PT Intensity: Minimum of 1-2 x/day ,45 to 90 minutes PT Frequency: 5 out of 7 days PT Duration Estimated Length of Stay: 28 to 30 days OT Intensity: Minimum of 1-2 x/day, 45 to 90 minutes OT Frequency: 5 out of 7 days OT Duration/Estimated Length of Stay: 27-30 days         Team Interventions: Nursing Interventions Patient/Family Education, Bladder Management, Bowel Management, Pain Management, Medication  Management, Skin Care/Wound Management, Discharge Planning  PT interventions Ambulation/gait training, Balance/vestibular training, Cognitive remediation/compensation, Community reintegration, Discharge planning, Functional mobility training, Functional electrical stimulation, DME/adaptive equipment instruction, Neuromuscular re-education, Pain management, Patient/family education, Splinting/orthotics, Psychosocial support, Stair training, Therapeutic Activities, Therapeutic Exercise, UE/LE Coordination activities, UE/LE Strength taining/ROM, Wheelchair propulsion/positioning  OT Interventions Warden/rangerBalance/vestibular training, Fish farm managerDME/adaptive equipment instruction, Patient/family education, Therapeutic Activities, Wheelchair propulsion/positioning, Therapeutic Exercise, Psychosocial support, Community reintegration, Functional mobility training, Self Care/advanced ADL retraining, UE/LE Strength taining/ROM, Discharge planning, UE/LE Coordination activities, Pain management  SLP Interventions    TR Interventions    SW/CM Interventions Discharge Planning, Psychosocial Support, Patient/Family Education    Team Discharge Planning: Destination: PT-Home ,OT- Home , SLP-  Projected Follow-up: PT-Home health PT, OT-  24 hour supervision/assistance, Home health OT, SLP-  Projected Equipment Needs: PT-To be determined, OT- 3 in 1 bedside comode, SLP-  Equipment Details: PT- , OT-  Patient/family involved in discharge planning: PT- Patient, Family member/caregiver,  OT-Patient, Family member/caregiver, SLP-   MD ELOS: 28-30 days Medical Rehab Prognosis:  Excellent Assessment: The patient has been admitted for CIR therapies with the diagnosis of cervical myelopathy with incomplete tetraplegia. The team will be addressing functional mobility, strength, stamina, balance, safety, adaptive techniques and equipment, self-care, bowel and bladder mgt, patient and caregiver education, pain mgt, NMR, spasticity control,  orthotics, w/c assessment, ego support,  skin care, community education. Goals have been set at min assist for mobility/transfers, self-care/ADL's. Pt is motivated and husband is supportive.Ranelle Oyster, MD, FAAPMR      See Team Conference Notes for weekly updates to the plan of care

## 2015-10-21 NOTE — Progress Notes (Signed)
Physical Therapy Session Note  Patient Details  Name: Jasmine EddyBrenda [name] MRN: [id number] Date of Birth: [birth year]  Today's Date: 10/21/2015 PT Individual Time: 1100-1157 PT Individual Time Calculation (min): 57 min   Short Term Goals: Week 1:  PT Short Term Goal 1 (Week 1): Pt will increase rolling R/L with side rails and mod A.  PT Short Term Goal 2 (Week 1): Pt will increase supine to edge of bed, edge of bed to supine to mod A and verbal cues.  PT Short Term Goal 3 (Week 1): Pt will increase transfers bed to chair to max A.  PT Short Term Goal 4 (Week 1): Pt will increase w/c mobility to min A about 50 feet.  PT Short Term Goal 5 (Week 1): Pt will ambulate about 10 feet with +2 max A.   Skilled Therapeutic Interventions/Progress Updates:   Session focused on SCI education with pt and pt's husband in regards to ROM, pressure relief/skin breakdown risk, and demonstrating boosting, functional slideboard transfers with +2 assist with focus on technique and cues for head/hips relationship and hand placement, and neuro re-ed to address postural control re-training, trunk control, sitting balance, and limits of stability. Pt seated edge of mat to work on various neuro re-ed including weightshifting tasks, balance statically with BUE, 1 UE, and no UE support, use of therapy ball to promote anterior and posterior weighshifts and strengthening of UE's to maintain support, and overall posture. Pt required min to total assist for balance. End of session, demonstrated and educated pt and pt's husband on pressure relief up in w/c. Both appear very anxious and required encouragement and support throughout session.   Therapy Documentation Precautions:  Precautions Precautions: Cervical Precaution Comments: orthostatic BPs Restrictions Weight Bearing Restrictions: No General:   Vital Signs:  127/88 mmHg; HR = 88 bpm while working edge of mat Pain:  reports burning and sensitivity to touch in trunk,  arms, and BLE - premedicated and rest breaks.    See Function Navigator for Current Functional Status.   Therapy/Group: Individual Therapy  Karolee StampsGray, Jasmine [name]  Jasmine [name], PT, DPT  10/21/2015, 12:06 PM

## 2015-10-21 NOTE — Progress Notes (Signed)
Foley care performed this morning with moderate amount of dark discharge from vagina.  Patient stated that she skipped a day of her birth control medication and then started her new pack in an attempt to avoid a period.  Peri care performed.  Patient continues to describe pain as "burning" and "pins and needles" mostly in bilateral legs.  Husband at bedside.  Will continue to monitor for changes.    Kelli HopeBarber, Havish Petties M

## 2015-10-21 NOTE — Progress Notes (Signed)
Physical Therapy Session Note  Patient Details  Name: Jasmine EddyBrenda Fuqua MRN: [id number] Date of Birth: 1975-08-19  Today's Date: 10/21/2015 PT Individual Time: 1415-1530 PT Individual Time Calculation (min): 75 min   Short Term Goals: Week 1:  PT Short Term Goal 1 (Week 1): Pt will increase rolling R/L with side rails and mod A.  PT Short Term Goal 2 (Week 1): Pt will increase supine to edge of bed, edge of bed to supine to mod A and verbal cues.  PT Short Term Goal 3 (Week 1): Pt will increase transfers bed to chair to max A.  PT Short Term Goal 4 (Week 1): Pt will increase w/c mobility to min A about 50 feet.  PT Short Term Goal 5 (Week 1): Pt will ambulate about 10 feet with +2 max A.   Skilled Therapeutic Interventions/Progress Updates:   Pt still up in w/c but reporting fatigue. PT attempted to find different TIS w/c (due to this one being so high, very difficult for patient to have LE's on supportive surface for transfers) but unable to find one appropriate for patient. Educated on trial with reclining w/c tomorrow and the differences between the two as the reclining would allow for option to start w/c propulsion training. Pt and husband agreeable. Pt able to initiate anterior and L lateral weighshift in w/c using armrests for support with mod assist. Performed +2 assist transfer back to bed with slideboard and step under feet but due to increasing extensor spasm in BLE during this transfer, pt unable to assist further and required total assist.  Pt positioned in supine. Instructed pt and husband in PROM/stretching for BLE with handout accompanying this for ankle DF/PF, heel slides, hip abduction/adduction, hip IR/ER, and hamstring stretches with 10 repetitions for ROM and 3 x 30 sec hold for stretches. PT performed on L and then husband return demonstrated on R with cues for technique. Pt able to activate DF/PF, hip flexion, and hip adduction on R in gravity eliminated position; L only trace PF  and knee extension. Strong emphasis on education in regards to SCI recovery, tone, positioning, and ROM during session. Positioned with all needs in reach and husband at bedside.   Therapy Documentation Precautions:  Precautions Precautions: Cervical Precaution Comments: orthostatic BPs Restrictions Weight Bearing Restrictions: No  Pain: 5/10 neuropathic pain in UE and LEs.   See Function Navigator for Current Functional Status.   Therapy/Group: Individual Therapy  Karolee StampsGray, Yannis Broce Darrol PokeBrescia  Riel Hirschman B. Kawika Bischoff, PT, DPT  10/21/2015, 3:41 PM

## 2015-10-21 NOTE — Care Management Note (Signed)
[facility] Individual Statement of Services  Patient Name:  Jasmine EddyBrenda Jubb  Date:  10/21/2015  Welcome to the [facility].  Our goal is to provide you with an individualized program based on your diagnosis and situation, designed to meet your specific needs.  With this comprehensive rehabilitation program, you will be expected to participate in at least 3 hours of rehabilitation therapies Monday-Friday, with modified therapy programming on the weekends.  Your rehabilitation program will include the following services:  Physical Therapy (PT), Occupational Therapy (OT), 24 hour per day rehabilitation nursing, Therapeutic Recreaction (TR), Neuropsychology, Case Management (Social Worker), Rehabilitation Medicine, Nutrition Services and Pharmacy Services  Weekly team conferences will be held on Tuesdays to discuss your progress.  Your Social Worker will talk with you frequently to get your input and to update you on team discussions.  Team conferences with you and your family in attendance may also be held.  Expected length of stay: 28 days  Overall anticipated outcome: minimal assistance  Depending on your progress and recovery, your program may change. Your Social Worker will coordinate services and will keep you informed of any changes. Your Social Worker's name and contact numbers are listed  below.  The following services may also be recommended but are not provided by the [facility]:   Driving Evaluations  Home Health Rehabiltiation Services  Outpatient Rehabilitation Services  Vocational Rehabilitation   Arrangements will be made to provide these services after discharge if needed.  Arrangements include referral to agencies that provide these services.  Your insurance has been verified to be:  [employer] Your primary doctor is:  Baruch GoutyMelinda Lada  Pertinent information will be shared with your doctor and your insurance  company.  Social Worker:  [place] Gro[place]cy Murry [place], [place] [phone] or ([phone]) [phone]   Information discussed with and copy given to patient by: Amada JupiterHOYLE, Yarethzy Croak, 10/21/2015, 3:42 PM

## 2015-10-21 NOTE — Progress Notes (Addendum)
Soap suds enema administered. Between enema and disimpaction pt had a large amount of formed/hardstool removed. Patient tolerated well. Foley care done, and RN found brown blood. Patient says she skipped her period with her birth control pills. Will continue to monitor throughout my shift. Rudie MeyerEurillo, Kasten Leveque A, RN
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
## 2015-10-21 NOTE — Progress Notes (Signed)
Occupational Therapy Session Note  Patient Details  Name: Jasmine [name] MRN: [id number] Date of Birth: Jul 14, 1975  Today's Date: 10/21/2015 OT Individual Time: 1300-1336 OT Individual Time Calculation (min): 36 min    Short Term Goals: Week 1:  OT Short Term Goal 1 (Week 1): Pt will perform slide board transfer to drop arm commode with 1 person assist in order to decrease level of assist with functional transfers.  OT Short Term Goal 2 (Week 1): Pt will direct slide board transfer from bed <>wheelchair withouts cues from therapist in order to display knowledge and understanding of transfer sequence. OT Short Term Goal 3 (Week 1): Pt will perform UB dressing with mod A in order to decrease level of assist for functional tasks.  Skilled Therapeutic Interventions/Progress Updates: Therex with focus on improved LUE strength and pain control.   With spouse observing and participating in session, OT educated pt on table-top UE exercises, bilateral UE exercises and general principles to promote normalization of movement at LUE.   Pt presents with weakness at fingers/thumb and is unable to actively extend digits.   OT educated pt on use of tenodesis grip while guiding patient through table-top BUE exercises.  Pt and spouse note improved posture performing figure-8s, U's, and shoulder protraction/retraction exercises.    OT advised use of dorsal splint at left hand during functional tasks and resting splint previously provided at night to maintain functional hand position.     Therapy Documentation Precautions:  Precautions Precautions: Cervical Precaution Comments: orthostatic BPs Restrictions Weight Bearing Restrictions: No   Vital Signs: Therapy Vitals Temp: 97.8 F (36.6 C) Temp Source: Oral Pulse Rate: 75 Resp: 18 BP: 120/83 mmHg Patient Position (if appropriate): Sitting Oxygen Therapy SpO2: 98 % O2 Device: Not Delivered   Pain: Pain Assessment Pain Assessment: 0-10 Pain  Score: 5  Pain Type: Neuropathic pain Pain Location: Shoulder Pain Orientation: Left;Upper;Medial Pain Intervention(s): Repositioned;Acupressure;Elevated extremity Multiple Pain Sites: No  See Function Navigator for Current Functional Status.   Therapy/Group: Individual Therapy  Shalin Linders 10/21/2015, 2:19 PM

## 2015-10-21 NOTE — Progress Notes (Signed)
Patient information reviewed and entered into eRehab system by Mckinna Demars, RN, CRRN, PPS Coordinator.  Information including medical coding and functional independence measure will be reviewed and updated through discharge.    

## 2015-10-21 NOTE — Progress Notes (Signed)
Occupational Therapy Session Note  Patient Details  Name: Jasmine [name] MRN: [id number] Date of Birth: 09/14/1975  Today's Date: 10/21/2015 OT Individual Time: 0930-1045 OT Individual Time Calculation (min): 75 min    Short Term Goals: Week 1:  OT Short Term Goal 1 (Week 1): Pt will perform slide board transfer to drop arm commode with 1 person assist in order to decrease level of assist with functional transfers.  OT Short Term Goal 2 (Week 1): Pt will direct slide board transfer from bed <>wheelchair withouts cues from therapist in order to display knowledge and understanding of transfer sequence. OT Short Term Goal 3 (Week 1): Pt will perform UB dressing with mod A in order to decrease level of assist for functional tasks.  Skilled Therapeutic Interventions/Progress Updates:    Pt seen for ADL retraining with +2 A for the first 30 min from her PT. Pt received in bed and focus of treatment on pt tolerating movement.  Pt cued to work on deep breathing during movement, pt very anxious about pain and hypersensitivity. LB B/D bed level.  Pt worked on rolling with max A and sidelying to sit with +2 A. Used slide board to w/c with +2 A.  Once in chair, pt calmer, leg rests adjusted with towels and pillows.  Pt worked on UB B/D at sink with assist to lean forward to the sink. Pt needs assist with grooming set up.  Pt's husband eager to learn things he can do to assist his wife's recovery. They are both very concerned about her limited L hand movement.  Educated pt and spouse on various exercises with pt practicing each exercise. Pt practiced each exercise 10-15 times: AROM of elbow extension, slight resistance with elbow extension, PROM to finger MP joints, active wrist extension to encourage tenodesis for grasp. Pt in room with spouse and all needs met.  Therapy Documentation Precautions:  Precautions Precautions: Cervical Precaution Comments: orthostatic BPs Restrictions Weight Bearing  Restrictions: No   Pain: pt c/o of L shoulder pain with activity, legs hypersensitive to touch   ADL:   See Function Navigator for Current Functional Status.   Therapy/Group: Individual Therapy  SAGUIER,JULIA 10/21/2015, 12:58 PM  
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
## 2015-10-22 ENCOUNTER — Inpatient Hospital Stay (HOSPITAL_COMMUNITY): Payer: BLUE CROSS/BLUE SHIELD | Admitting: Occupational Therapy

## 2015-10-22 ENCOUNTER — Inpatient Hospital Stay (HOSPITAL_COMMUNITY): Payer: BLUE CROSS/BLUE SHIELD | Admitting: Physical Therapy

## 2015-10-22 ENCOUNTER — Inpatient Hospital Stay (HOSPITAL_COMMUNITY): Payer: BLUE CROSS/BLUE SHIELD

## 2015-10-22 ENCOUNTER — Inpatient Hospital Stay (HOSPITAL_COMMUNITY): Payer: BLUE CROSS/BLUE SHIELD | Admitting: *Deleted

## 2015-10-22 DIAGNOSIS — G8250 Quadriplegia, unspecified: Secondary | ICD-10-CM

## 2015-10-22 DIAGNOSIS — S14109S Unspecified injury at unspecified level of cervical spinal cord, sequela: Secondary | ICD-10-CM

## 2015-10-22 DIAGNOSIS — N319 Neuromuscular dysfunction of bladder, unspecified: Secondary | ICD-10-CM

## 2015-10-22 DIAGNOSIS — K592 Neurogenic bowel, not elsewhere classified: Secondary | ICD-10-CM

## 2015-10-22 MED ORDER — SORBITOL 70 % SOLN
960.0000 mL | TOPICAL_OIL | Freq: Once | ORAL | Status: AC
  Administered 2015-10-22: 960 mL via RECTAL
  Filled 2015-10-22: qty 240

## 2015-10-22 MED ORDER — PREGABALIN 75 MG PO CAPS
75.0000 mg | ORAL_CAPSULE | Freq: Every day | ORAL | Status: DC
  Administered 2015-10-22 – 2015-11-15 (×25): 75 mg via ORAL
  Filled 2015-10-22 (×25): qty 1

## 2015-10-22 NOTE — Progress Notes (Signed)
Physical Therapy Session Note  Patient Details  Name: Jasmine [name] MRN: [id number] Date of Birth: 1975-12-09  Today's Date: 10/22/2015 PT Individual Time: 1300-1400 PT Individual Time Calculation (min): 60 min   Short Term Goals: Week 1:  PT Short Term Goal 1 (Week 1): Pt will increase rolling R/L with side rails and mod A.  PT Short Term Goal 2 (Week 1): Pt will increase supine to edge of bed, edge of bed to supine to mod A and verbal cues.  PT Short Term Goal 3 (Week 1): Pt will increase transfers bed to chair to max A.  PT Short Term Goal 4 (Week 1): Pt will increase w/c mobility to min A about 50 feet.  PT Short Term Goal 5 (Week 1): Pt will ambulate about 10 feet with +2 max A.   Skilled Therapeutic Interventions/Progress Updates:    Focused on neuro re-ed for BLE motor control and strengthening in seated position, neuro re-ed in standing frame for weightbearing through BLE, postural control retraining, and standing tolerance, and w/c mobility. Pt instructed in ankle DF/PF, knee extension, and marches x 10-12 reps each BLE. LLE pt with trace movements up to 2-/5 (ankle PF) and RLE able to activate against gravity. Pt tolerated ~ 2 min in standing frame (not fully upright but close) with facilitation at trunk for upright posture and second person to assist with approximation at feet for positioning. Education provided throughout session and pressure relief performed by PT at end of session. Worked on positioning in the w/c and lateral/anterior leans. All needs in reach and visitor present with patient.   Therapy Documentation Precautions:  Precautions Precautions: Cervical Precaution Comments: orthostatic BPs Restrictions Weight Bearing Restrictions: No   Pain:  nerve pain in BLE and LUE - premedicated.    See Function Navigator for Current Functional Status.   Therapy/Group: Individual Therapy  Karolee StampsGray, [name] Darrol PokeBrescia  Marvelle Span B. Jessicah Croll, PT, DPT  10/22/2015, 2:04 PM

## 2015-10-22 NOTE — Progress Notes (Signed)
SMOG enema administered. Patient still required disimpaction. Hard stool was removed at first and by the end stool was softer. Patient unable to pass stool without disimpaction. Stool still coming from high in the rectal vault. RN got a large amount of stool removed and could no longer feel anymore. Patient felt relief and tolerated well. Will continue to monitor. Rudie MeyerEurillo, Kaden Dunkel A, RN

## 2015-10-22 NOTE — Plan of Care (Signed)
Problem: RH Ambulation Goal: LTG Patient will ambulate in home environment (PT) LTG: Patient will ambulate in home environment, # of feet with assistance (PT).  D/C  Problem: RH Wheelchair Mobility Goal: LTG Patient will propel w/c in community environment (PT) LTG: Patient will propel wheelchair in community environment, # of feet with assist (PT)  Downgraded ABG

## 2015-10-22 NOTE — Progress Notes (Signed)
Physical Therapy Session Note  Patient Details  Name: Jasmine [name] MRN: [id number] Date of Birth: 1975/09/27  Today's Date: 10/22/2015 PT Individual Time: 1115-1155 PT Individual Time Calculation (min): 40 min   Short Term Goals: Week 1:  PT Short Term Goal 1 (Week 1): Pt will increase rolling R/L with side rails and mod A.  PT Short Term Goal 2 (Week 1): Pt will increase supine to edge of bed, edge of bed to supine to mod A and verbal cues.  PT Short Term Goal 3 (Week 1): Pt will increase transfers bed to chair to max A.  PT Short Term Goal 4 (Week 1): Pt will increase w/c mobility to min A about 50 feet.  PT Short Term Goal 5 (Week 1): Pt will ambulate about 10 feet with +2 max A.   Skilled Therapeutic Interventions/Progress Updates:   Patient in reclining wheelchair upon arrival with husband present asking about how to operate new wheelchair. Educated husband on management of brakes in back of wheelchair and safely reclining/raising wheelchair back with husband providing safe return demonstration. Switched out left ELR and adjusted for improved sitting tolerance and postural control in wheelchair. Patient instructed in wheelchair propulsion in controlled environment 2 x 170 ft with supervision-min A and max multimodal cues for sequencing and technique due to veering to R and difficulty with maintaining straight path. Added theraband to L wheelchair rim for improved proprioceptive input and increased grip due to L hand weakness. Patient reported improvement in ease of propulsion following theraband for LUE. Patient repositioned in wheelchair with max A and multimodal cues for technique with focus on anterior weight shifts and lifting back off of wheelchair. Lumbar towel roll added for improved positioning and upright posture due to forward flexed and rounded posture with minimal improvement noted. Patient left sitting in wheelchair with husband present and needs in reach.      Therapy  Documentation Precautions:  Precautions Precautions: Cervical Precaution Comments: orthostatic BPs Restrictions Weight Bearing Restrictions: No Pain:  C/o generalized numbness and tingling BLE and LUE, repositioned in wheelchair   See Function Navigator for Current Functional Status.   Therapy/Group: Individual Therapy  Jasmine [name], Jasmine [name] A 10/22/2015, 12:30 PM

## 2015-10-22 NOTE — Progress Notes (Signed)
[facility]IC[facility]ILIT[facility]     PROGRESS NOTE    Subjective/Complaints: Some success with bowel intervention. Still stool higher up in the vault. Pt feels better withwhat she's already evacuated. Legs are still tender but seem to be tolerating ROM/movement better.  ROS: Pt denies fever, rash/itching, headache, blurred or double vision, nausea, vomiting, abdominal pain, diarrhea, chest pain, shortness of breath, palpitations, dysuria, dizziness,  bleeding, or depression   Objective: Vital Signs: Blood pressure 124/75, pulse 61, temperature 98.1 F (36.7 C), temperature source Oral, resp. rate 18, weight 68.675 kg (151 lb 6.4 oz), last menstrual period 09/19/2015, SpO2 98 %. No results found.  Recent Labs  10/21/15 0827  WBC 17.6*  HGB 14.5  HCT 43.6  PLT 360    Recent Labs  10/21/15 0827  NA 134*  K 4.4  CL 100*  GLUCOSE 129*  BUN 24*  CREATININE 0.56  CALCIUM 9.1   CBG (last 3)  No results for input(s): GLUCAP in the last 72 hours.  Wt Readings from Last 3 Encounters:  10/19/15 68.675 kg (151 lb 6.4 oz)  10/11/15 68.6 kg (151 lb 3.8 oz)  09/18/15 64.411 kg (142 lb)    Physical Exam:  Constitutional: She is oriented to person, place, and time. She appears well-developed and well-nourished.  HENT: dentition normal, mucosa pink and moist Head: Normocephalic.  Right Ear: External ear normal.  Left Ear: External ear normal.  Eyes: Conjunctivae and EOM are normal.  Neck: Normal range of motion. Neck supple. No thyromegaly present.  Cardiovascular: Normal rate and regular rhythm.  Respiratory: Effort normal and breath sounds normal. No respiratory distress.  GI: Soft. Bowel sounds are normal. She exhibits no distension.  Musculoskeletal: She exhibits edema and tenderness.--tolerated manipulation of thighs better today  Neurological: She is alert and oriented to person, place, and time.  DTRs symmetric in bilateral upper extremities, 3+ in  lowers etensor tone 3/4 bilateral lower exts, left leg tender Sensation decreased wrists bilaterally left more than right. Motor: Right upper extremity: delt 5/5, bicep 5/5, tricep 4/5, wrist 4/5, HI 3.  Left upper extremity: Shoulder abduction, elbow flexion 4+ to 5/5, elbow extension 1/5, wrist extension 2/5, , finger grip 1/5 Left lower extremity---I see trace APF/ADF today Right lower extremity: HF and KE 1 to 1+/5, ADF/PF tr to 1/5 Skin:  Neck incision clean and dry anterior neck.  Psychiatric: Her speech is normal. Thought content normal. Her mood is appropriate  Assessment/Plan: 1. 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 Physiatrist is providing close team supervision and 24 hour management of active medical problems listed below. Physiatrist and rehab team continue to assess barriers to discharge/monitor patient progress toward functional and medical goals.  Function:  Bathing Bathing position   Position: Bed  Bathing parts Body parts bathed by patient: Left arm, Chest, Abdomen, Front perineal area, Right upper leg, Left upper leg Body parts bathed by helper: Right arm, Buttocks, Right lower leg, Left lower leg, Back  Bathing assist Assist Level: 2 helpers      Upper Body Dressing/Undressing Upper body dressing   What is the patient wearing?: Pull over shirt/dress     Pull over shirt/dress - Perfomed by patient: Thread/unthread right sleeve, Thread/unthread left sleeve, Put head through opening Pull over shirt/dress - Perfomed by helper: Pull shirt over trunk        Upper body assist Assist Level:  (  max a)      Lower Body Dressing/Undressing Lower body dressing   What is the patient wearing?: Pants, Non-skid slipper socks       Pants- Performed by helper: Thread/unthread right pants leg, Thread/unthread left pants leg, Pull pants up/down    Non-skid slipper socks- Performed by helper: Don/doff right sock, Don/doff left sock                  Lower body assist Assist for lower body dressing: 2 Helpers      Toileting Toileting Toileting activity did not occur: Safety/medical concerns        Toileting assist Assist level: Two helpers (per [employer], NT report)   Transfers Chair/bed transfer Chair/bed transfer activity did not occur: Safety/medical concerns Chair/bed transfer method: Lateral scoot Chair/bed transfer assist level: 2 helpers Chair/bed transfer assistive device: Sliding board     Locomotion Ambulation Ambulation activity did not occur: Safety/medical Investment banker, operational activity did not occur: Safety/medical concerns Type:  (TIS)   Assist Level: Dependent (Pt equals 0%)  Cognition Comprehension Comprehension assist level: Follows complex conversation/direction with no assist  Expression Expression assist level: Expresses complex ideas: With no assist  Social Interaction Social Interaction assist level: Interacts appropriately with others - No medications needed.  Problem Solving Problem solving assist level: Solves complex problems: Recognizes & self-corrects  Memory Memory assist level: Complete Independence: No helper   Medical Problem List and Plan: 1. 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 Status post ACDF C4-5, 5-6.   -daily SCI education being provided  -begin CIR therapies 2. DVT Prophylaxis/Anticoagulation: SCDs.    vascular study negative -initiated sq lovenox 30 q12---12 week duration 3. Pain Management: Baclofen 10 mg 3 times a day--titrated to  on Saturday. Valium qhs prn  -Lyrica 150 mg daily at bedtime---add am dose  -Cymbalta 30 mg daily.   -Hydrocodone and Robaxin as needed.   4. Mood: team to provide ego support, SW to follow up 5. Neuropsych:  This patient is capable of making decisions on her own behalf. 6. Skin/Wound Care: Routine skin checks  -specialty air mattress in place 7. Fluids/Electrolytes/Nutrition: Routine I&O's with follow-up chemistries pending today. 8. Neurogenic bowel and bladder. . Will need bowel/bladder program  - continue foley for now---remove later this week  -added hs stool softener/lax for potential AM program  -SMOG enema to evacuate bowels 9. Leukocytosis due to steroids.    LOS (Days) 4 A FACE TO FACE EVALUATION WAS PERFORMED  Cadarius Nevares T 10/22/2015 8:34 AM

## 2015-10-22 NOTE — Progress Notes (Signed)
Physical Therapy Session Note  Patient Details  Name: Jasmine [name] MRN: [id number] Date of Birth: [birth year]  Today's Date: 10/22/2015 PT Individual Time: 1015-1100 PT Individual Time Calculation (min): 45 min   Short Term Goals: Week 1:  PT Short Term Goal 1 (Week 1): Pt will increase rolling R/L with side rails and mod A.  PT Short Term Goal 2 (Week 1): Pt will increase supine to edge of bed, edge of bed to supine to mod A and verbal cues.  PT Short Term Goal 3 (Week 1): Pt will increase transfers bed to chair to max A.  PT Short Term Goal 4 (Week 1): Pt will increase w/c mobility to min A about 50 feet.  PT Short Term Goal 5 (Week 1): Pt will ambulate about 10 feet with +2 max A.   Skilled Therapeutic Interventions/Progress Updates:   Session focused on functional slideboard transfer training with +2 assist with focus on technique, neuro re-ed to address trunk control, balance, postural control, and limits of stability seated EOM, and changed w/c to reclining w/c to allow option for trial of w/c propulsion. During transfers, needed a block under feet for LE support with multimodal cues for hand placement, head/hips relationship, and anterior weighshift and +2 assist. Pt able to maintain static sitting balance about 5-10 seconds with very close supervision and BUE support. Progressed activities EOM to encourage weightshifting, challenging balance, and finding balance point. End of session returned to room with husband present.   Pt able to demonstrate R knee extension from seated in w/c position.   Therapy Documentation Precautions:  Precautions Precautions: Cervical Precaution Comments: orthostatic BPs Restrictions Weight Bearing Restrictions: No Pain: C/o nerve pain through BLE and LUE - premedicated.    See Function Navigator for Current Functional Status.   Therapy/Group: Individual Therapy and Co-Treatment with TR  Karolee StampsGray, Davinity Fanara Darrol PokeBrescia  Wang Granada B. Javarius Tsosie, PT,  DPT  10/22/2015, 12:09 PM

## 2015-10-22 NOTE — Progress Notes (Signed)
Occupational Therapy Session Note  Patient Details  Name: Jasmine [name] MRN: [id number] Date of Birth: May 13, 1976  Today's Date: 10/22/2015 OT Individual Time: [id number] and 1430-1500 OT Individual Time Calculation (min): 60 min and 30 min   Short Term Goals: Week 1:  OT Short Term Goal 1 (Week 1): Pt will perform slide board transfer to drop arm commode with 1 person assist in order to decrease level of assist with functional transfers.  OT Short Term Goal 2 (Week 1): Pt will direct slide board transfer from bed <>wheelchair withouts cues from therapist in order to display knowledge and understanding of transfer sequence. OT Short Term Goal 3 (Week 1): Pt will perform UB dressing with mod A in order to decrease level of assist for functional tasks.  Skilled Therapeutic Interventions/Progress Updates:    Session One: Pt seen for OT ADL session focusing on ADL re-training, functional mobility, transfes, and education. Pt in supine upon arrival with husband present and agreeable to tx session. LB bathing completed total A while pt washed face. She declined full bathing/dressing as she was already wearing the clothes she wanted to wear for the day.  She was able to tolerate touching for bathing to be completed and shoes/ TED hose to be donned. She rolled and transferred to EOB with max A+1. +2 sliding board transfer completed to w/[name] with block placed under pt's feet for added stability as bed had to be raised to be at w/[name] level. VCs provided for technique and handplacement during transfer. She completed oral care seated in w/[name] at sink. Hand over hand assist provided to assist with grasping toothbrush in L UE and pt able to move UE at shoulder and elbow joint to brush teeth. Pt left sitting in w/[name] at end of session, all needs in reach and husband present.  Both pt and caregiver with lots of questions regarding pt's prognosis, chance of recoery/ rate of recovery and therapy placed.   Session Two:  Pt seen for OT session focusing on education, core strengthening/ stability and functional  Transfers. Pt sitting up in w/[name] upon arrival, voicing fatigue, however, agreeable to tx session. She was taken to therapy day room for scenic change and emotional support. She had difficulty completing towel pushes at table top level with increased difficulty coming off back of w/[name]. Turned pt's w/[name] to face therapist and worked on pt pulling from therapist and w/[name] arm rests to come forward and working on controlled return to back of w/[name]. Completed x3 trials. Worked on tenodesis grasping and education for functional use of grasp. Pt with full range of motion at wrist with weak tenodesis grasp. She returned to room and completed +2 sliding board transfer back to bed and return to supine. PRAFO boats and SCDs donned. Pt left with all needs in reach. Pt's R LE significantly colder than L. RN made aware. Educated throughout session regarding role of OT, continuum of care, HHOT vs OPOT, and d/[name] planning.   Therapy Documentation Precautions:  Precautions Precautions: Cervical Precaution Comments: orthostatic BPs Restrictions Weight Bearing Restrictions: No Pain: Pain Assessment Pain Assessment: 0-10 Pain Score: 4  Pain Type: Neuropathic pain Pain Location: Generalized (waist down) Pain Orientation: Left;Right Pain Descriptors / Indicators: Burning;Pins and needles Pain Frequency: Constant Pain Onset: On-going Patients Stated Pain Goal: 3 Pain Intervention(s): Repositioned  See Function Navigator for Current Functional Status.   Therapy/Group: Individual Therapy  Jasmine [name] 10/22/2015, 7:08 AM

## 2015-10-22 NOTE — Progress Notes (Signed)
Pt disimpacted this AM for a small amount of hard stool. Pt tolerated well. Will continue to monitor. Rudie MeyerEurillo, Makynna Manocchio A, RN
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
## 2015-10-22 NOTE — Plan of Care (Signed)
Problem: SCI BLADDER ELIMINATION Goal: RH STG MANAGE BLADDER WITH ASSISTANCE STG Manage Bladder With mod Assistance  Outcome: Not Progressing Total assist- pt has a foley

## 2015-10-23 ENCOUNTER — Inpatient Hospital Stay (HOSPITAL_COMMUNITY): Payer: BLUE CROSS/BLUE SHIELD

## 2015-10-23 ENCOUNTER — Inpatient Hospital Stay (HOSPITAL_COMMUNITY): Payer: BLUE CROSS/BLUE SHIELD | Admitting: Occupational Therapy

## 2015-10-23 ENCOUNTER — Inpatient Hospital Stay (HOSPITAL_COMMUNITY): Payer: BLUE CROSS/BLUE SHIELD | Admitting: *Deleted

## 2015-10-23 ENCOUNTER — Inpatient Hospital Stay (HOSPITAL_COMMUNITY): Payer: BLUE CROSS/BLUE SHIELD | Admitting: Physical Therapy

## 2015-10-23 MED ORDER — BISACODYL 10 MG RE SUPP
10.0000 mg | Freq: Every day | RECTAL | Status: DC
  Administered 2015-10-24 – 2015-10-29 (×6): 10 mg via RECTAL
  Filled 2015-10-23 (×6): qty 1

## 2015-10-23 MED ORDER — CLOTRIMAZOLE 10 MG MT TROC
10.0000 mg | Freq: Every day | OROMUCOSAL | Status: DC
  Administered 2015-10-23 – 2015-11-02 (×41): 10 mg via ORAL
  Filled 2015-10-23 (×52): qty 1

## 2015-10-23 MED ORDER — NORTRIPTYLINE HCL 10 MG PO CAPS
10.0000 mg | ORAL_CAPSULE | Freq: Every day | ORAL | Status: DC
  Administered 2015-10-23 – 2015-10-29 (×7): 10 mg via ORAL
  Filled 2015-10-23 (×7): qty 1

## 2015-10-23 NOTE — Progress Notes (Signed)
Social Work Patient ID: [name], female   DOB: 05-27-1976, 40 y.o.   MRN: [id number]   Have reviewed team conference with pt and spouse.  Both aware and agreeable with targeted d/c date of 6/9 with min assist goals overall.  Pt aware that ambulation goals are still being considered but doubtful she will have any functional ambulation - pt understands.  She seems somewhat more encouraged today and reports "I'm fighting as hard as I can."  Continue to follow.  Elaura Calix, LCSW

## 2015-10-23 NOTE — Plan of Care (Signed)
Problem: SCI BOWEL ELIMINATION Goal: RH STG MANAGE BOWEL WITH ASSISTANCE STG Manage Bowel with Mod Assistance.  Outcome: Not Progressing Patient requires total assist at this time. Will begin bowel program in am . Goal: RH STG SCI MANAGE BOWEL WITH MEDICATION WITH ASSISTANCE STG SCI Manage bowel with medication with Mod assistance.  Outcome: Not Progressing Requires total assist

## 2015-10-23 NOTE — Progress Notes (Signed)
Occupational Therapy Session Note  Patient Details  Name: Jasmine [name] MRN: [id number] Date of Birth: 12/28/1975  Today's Date: 10/23/2015 OT Individual Time: 4098-1191 and 1345-1500 OT Individual Time Calculation (min): 60 and 75 min    Short Term Goals: Week 1:  OT Short Term Goal 1 (Week 1): Pt will perform slide board transfer to drop arm commode with 1 person assist in order to decrease level of assist with functional transfers.  OT Short Term Goal 2 (Week 1): Pt will direct slide board transfer from bed <>wheelchair withouts cues from therapist in order to display knowledge and understanding of transfer sequence. OT Short Term Goal 3 (Week 1): Pt will perform UB dressing with mod A in order to decrease level of assist for functional tasks.  Skilled Therapeutic Interventions/Progress Updates:    Session One: Pt seen for For OT ADL bathing/dressing session. Pt in supine upon arrival, agreeable to tx session. LB dressing/ bathing completed total A in supine with pt rolling with mod-max A. Pt noted to have increased tone in R LE this morning compared to yesterday. She transferred to EOB with max A +1. Sliding board transfer completed to w/c with +2 assist. Pt with increased extensor tone during transfer, requiring increased assist to safely get her into the chair.  She completed UB bathing/dressing from w/c level at sink, requiring increased assist to come forward off back of w/c and VCs for technique for donning/doffing shirt using modified techniques.  She completed functional grasp/release using soft foam block. Pt with decreased gross grasp in L UE, able to use tenodsesis to assist with grasping though had some difficulty with wrist flexion to release object. Pt left sitting in w/c at end of session, all needs in reach.   Session Two: Pt in w/c upon arrival, agreeable to tx session. She was taken to therapy gym total A. She completed sliding board transfer w/c > therapy mat with +2  assist. Seated on mat, worked on pt finding and maintaining balance point without UE support. Able to maintain position ~15-20 seconds with very close supervision.  Focus on chest expansion and positioning with pt opening up shoulders for increased diaphragm expansion to improve breathing quality. She reported increased comfort in back with opened posture.  Completed thigh squeezes with ball placed btwn legs. Noted significantly increased movement in R LE compared to L, however, observed trace muscle activation in L thigh.  Reaching task from [facility] with emphasis on anterior/posterior weight shift/ control and increasing sitting balance without need for B UE support. Completed with min A.  +2 sliding board transfer completed back to chair at end of session. She self propelled w/c back to room for UE strengthening and coordination with VCs for w/c propulsion technique and min A for turns. Pt left sitting in w/c at end of session, all needs in reach and husband present.   Educated throughout session regarding bowel/ bladder program, w/c modifications for positioning, benefits/ functional implications for positioning, decreased sensation and functional implications, OT goals, w/c seating options, boasting schedule, and d/c planning.   Therapy Documentation Precautions:  Precautions Precautions: Cervical Precaution Comments: orthostatic BPs Restrictions Weight Bearing Restrictions: No Pain: Pain Assessment Pain Assessment: 0-10 Pain Score: 4  Pain Type: Neuropathic pain Pain Location: Generalized Pain Descriptors / Indicators: Burning;Discomfort;Pins and needles;Tightness;Tingling Pain Frequency: Constant Pain Onset: On-going Patients Stated Pain Goal: 4 Pain Intervention(s): Repositioned; increased activity  See Function Navigator for Current Functional Status.   Therapy/Group: Individual Therapy  Lewis, Stefanie Hodgens C 10/23/2015,  7:07 AM

## 2015-10-23 NOTE — Plan of Care (Signed)
Problem: SCI BLADDER ELIMINATION Goal: RH STG MANAGE BLADDER WITH ASSISTANCE STG Manage Bladder With mod Assistance  Outcome: Not Progressing Total care at this time foley in place.  Goal: RH STG MANAGE BLADDER WITH EQUIPMENT WITH ASSISTANCE STG Manage Bladder With Equipment With Mod Assistance  Outcome: Not Progressing Total assist

## 2015-10-23 NOTE — Progress Notes (Signed)
Physical Therapy Session Note  Patient Details  Name: Jasmine [name] MRN: [id number] Date of Birth: [birth year]  Today's Date: 10/23/2015 PT Individual Time: 0900-0958 PT Individual Time Calculation (min): 58 min   Short Term Goals: Week 1:  PT Short Term Goal 1 (Week 1): Pt will increase rolling R/L with side rails and mod A.  PT Short Term Goal 2 (Week 1): Pt will increase supine to edge of bed, edge of bed to supine to mod A and verbal cues.  PT Short Term Goal 3 (Week 1): Pt will increase transfers bed to chair to max A.  PT Short Term Goal 4 (Week 1): Pt will increase w/c mobility to min A about 50 feet.  PT Short Term Goal 5 (Week 1): Pt will ambulate about 10 feet with +2 max A.   Skilled Therapeutic Interventions/Progress Updates:   Session focused on neuro re-ed to address trunk control, balance, postural control while performing functional slideboard transfers, standing in standing frame, and balance re-training seated EOM. Pt able to tolerate almost 7 min in standing frame today without any symptoms. Multimodal cues and facilitation for posture as pt with strong R weightshift bias and difficulty maintaining or correcting due to poor trunk control. Transfers with +2 assist and cues for technique, head/hips relationship, and anterior weightshift. Recommended to her and her husband to bring in some longer capris or pants to cover her thighs (wearing shorts) due to using slideboard and risk of sheer as well as pt very sensitive on LE's. Both agreeable and he hopes to bring them back for tomorrow. Pt propelled w/c back to room with min assist and PT demonstrated boosting technique in this reclining w/c to pt's husband who verbalized understanding.    Therapy Documentation Precautions:  Precautions Precautions: Cervical Precaution Comments: orthostatic BPs Restrictions Weight Bearing Restrictions: No  Pain: C/o nerve pain in BLE and trunk - premedicated.     See Function  Navigator for Current Functional Status.   Therapy/Group: Individual Therapy  Jasmine [name], Jasmine [name]  Jasmine [name], PT, DPT  10/23/2015, 11:40 AM

## 2015-10-23 NOTE — Progress Notes (Signed)
Physical Therapy Session Note  Patient Details  Name: Jasmine EddyBrenda Coldren MRN: [id number] Date of Birth: 03-23-1976  Today's Date: 10/23/2015 PT Individual Time: 1535-1620 PT Individual Time Calculation (min): 45 min   Short Term Goals: Week 1:  PT Short Term Goal 1 (Week 1): Pt will increase rolling R/L with side rails and mod A.  PT Short Term Goal 2 (Week 1): Pt will increase supine to edge of bed, edge of bed to supine to mod A and verbal cues.  PT Short Term Goal 3 (Week 1): Pt will increase transfers bed to chair to max A.  PT Short Term Goal 4 (Week 1): Pt will increase w/c mobility to min A about 50 feet.  PT Short Term Goal 5 (Week 1): Pt will ambulate about 10 feet with +2 max A.   Skilled Therapeutic Interventions/Progress Updates:   Patient in wheelchair with spouse and visitors present for session. Patient propelled wheelchair to gym using BUE with min A due to wheelchair veering to R x 170 ft and required assist for forward lean and HOH assist to doff R ELR. Patient c/o soreness and fatigue in trunk musculature from early therapies and requesting to work on LE strengthening. Seated in wheelchair, patient repositioned with max A to facilitate improved neutral pelvis and upright posture. With feet supported on step, performed seated BLE NMR: RLE LAQ 2 x 10, LLE LAQ x 10 with LE unweighted at thigh within small ROM, RLE ankle pumps x 20, attempted LLE ankle pumps but no muscle activation noted, manually resisted B hip abduction 2 x 10, B hip adduction with ball squeeze x 20, attempted RLE hip flexion. PROM to B heel cords, 2 x 30 sec each LE. Patient instructed in forward lean and propping B forearms on thighs for pressure relief. Discussed visualization/mental imagery and breathing techniques to aide in relaxation, patient verbalized understanding. Performed slide board transfer back to bed at end of session with BLE on step and transferred sit > supine with +2A. Patient left semi reclined  in bed with spouse and visitors at bedside, needs in reach.     Therapy Documentation Precautions:  Precautions Precautions: Cervical Precaution Comments: orthostatic BPs Restrictions Weight Bearing Restrictions: No Pain: Pain Assessment Pain Assessment: 0-10 Pain Score: 4  Pain Type: Neuropathic pain Pain Location: Generalized Pain Descriptors / Indicators: Burning;Discomfort;Pins and needles;Tightness;Tingling Pain Frequency: Constant Pain Onset: On-going Patients Stated Pain Goal: 4 Pain Intervention(s): premedicated  See Function Navigator for Current Functional Status.   Therapy/Group: Individual Therapy  Kerney ElbeVarner, Esperanza Madrazo A 10/23/2015, 4:52 PM

## 2015-10-23 NOTE — Progress Notes (Signed)
Patient complained of tongue soreness on back of tongue. Patient noted with increasing discoloration on back of tongue white in color. Cipriano MileP. Love, PA notified. Orders received. Continue with plan of care.  Cleotilde NeerJoyce, Jakub Debold S

## 2015-10-23 NOTE — Progress Notes (Signed)
[facility] PHYSICAL MEDICINE & REHABILITATION     PROGRESS NOTE    Subjective/Complaints: Had difficulty sleeping due to tingling in legs---denies that it was frank pain but more tingling which was distracting and anxiety causing. Had large BM yesterday  ROS: Pt denies fever, rash/itching, headache, blurred or double vision, nausea, vomiting, abdominal pain, diarrhea, chest pain, shortness of breath, palpitations, dysuria, dizziness,  bleeding, or depression   Objective: Vital Signs: Blood pressure 134/70, pulse 58, temperature 98.5 F (36.9 C), temperature source Oral, resp. rate 18, weight 68.675 kg (151 lb 6.4 oz), last menstrual period 09/19/2015, SpO2 96 %. No results found.  Recent Labs  10/21/15 0827  WBC 17.6*  HGB 14.5  HCT 43.6  PLT 360    Recent Labs  10/21/15 0827  NA 134*  K 4.4  CL 100*  GLUCOSE 129*  BUN 24*  CREATININE 0.56  CALCIUM 9.1   CBG (last 3)  No results for input(s): GLUCAP in the last 72 hours.  Wt Readings from Last 3 Encounters:  10/19/15 68.675 kg (151 lb 6.4 oz)  10/11/15 68.6 kg (151 lb 3.8 oz)  09/18/15 64.411 kg (142 lb)    Physical Exam:  Constitutional: She is oriented to person, place, and time. She appears well-developed and well-nourished.  HENT: dentition normal, mucosa pink and moist Head: Normocephalic.  Right Ear: External ear normal.  Left Ear: External ear normal.  Eyes: Conjunctivae and EOM are normal.  Neck: Normal range of motion. Neck supple. No thyromegaly present.  Cardiovascular: Normal rate and regular rhythm.  Respiratory: Effort normal and breath sounds normal. No respiratory distress.  GI: Soft. Bowel sounds are normal. She exhibits no distension.  Musculoskeletal: She exhibits edema and tenderness.--tolerated manipulation of thighs better today  Neurological: She is alert and oriented to person, place, and time.  DTRs symmetric in bilateral upper extremities, 3+ in lowers etensor tone 3/4  bilateral lower exts, left leg tender Sensation decreased wrists bilaterally left more than right. Motor: Right upper extremity: delt 5/5, bicep 5/5, tricep 4/5, wrist 4/5, HI 3.  Left upper extremity: Shoulder abduction, elbow flexion 4+ to 5/5, elbow extension 1/5, wrist extension 2/5, , finger grip 1/5 Left lower extremity---occasionally trace APF/ADF  Right lower extremity: HF and KE  1+/5, ADF/PF  1/5 Skin:  Neck incision clean and dry anterior neck.  Psychiatric: Her speech is normal. Thought content normal. Her mood is appropriate  Assessment/Plan: 1. 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 Physiatrist is providing close team supervision and 24 hour management of active medical problems listed below. Physiatrist and rehab team continue to assess barriers to discharge/monitor patient progress toward functional and medical goals.  Function:  Bathing Bathing position   Position: Bed (LLB in bed; UB w/c at sink)  Bathing parts Body parts bathed by patient: Right arm, Left arm, Chest, Abdomen Body parts bathed by helper: Right upper leg, Left upper leg, Right lower leg, Left lower leg, Back  Bathing assist Assist Level: 2 helpers      Upper Body Dressing/Undressing Upper body dressing   What is the patient wearing?: Pull over shirt/dress     Pull over shirt/dress - Perfomed by patient: Thread/unthread right sleeve, Thread/unthread left sleeve, Put head through opening Pull over shirt/dress - Perfomed by helper: Pull shirt over trunk        Upper body assist Assist Level: Touching or steadying assistance(Pt >  75%)      Lower Body Dressing/Undressing Lower body dressing   What is the patient wearing?: Ted Hose, Shoes, Pants       Pants- Performed by helper: Thread/unthread right pants leg, Thread/unthread left pants leg, Pull pants up/down    Non-skid slipper socks- Performed by helper: Don/doff right sock, Don/doff left sock       Shoes - Performed by helper: Don/doff right shoe, Don/doff left shoe, Fasten right, Fasten left       TED Hose - Performed by helper: Don/doff right TED hose, Don/doff left TED hose  Lower body assist Assist for lower body dressing: 2 Designer, multimediaHelpers      Toileting Toileting Toileting activity did not occur: Safety/medical concerns        Toileting assist Assist level: Two helpers (per Bank of AmericaMelissa Ward, NT report)   Transfers Chair/bed transfer Chair/bed transfer activity did not occur: Safety/medical concerns Chair/bed transfer method: Lateral scoot Chair/bed transfer assist level: 2 helpers Chair/bed transfer assistive device: Sliding board, Armrests     Locomotion Ambulation Ambulation activity did not occur: Safety/medical Investment banker, operationalconcerns         Wheelchair Wheelchair activity did not occur: Safety/medical concerns Type: Manual Max wheelchair distance: 170 ft Assist Level: Touching or steadying assistance (Pt > 75%)  Cognition Comprehension Comprehension assist level: Follows complex conversation/direction with no assist  Expression Expression assist level: Expresses complex ideas: With extra time/assistive device  Social Interaction Social Interaction assist level: Interacts appropriately with others - No medications needed.  Problem Solving Problem solving assist level: Solves complex problems: With extra time  Memory Memory assist level: Complete Independence: No helper   Medical Problem List and Plan: 1. 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 Status post ACDF C4-5, 5-6.   -daily SCI education being provided  -continue therapies--- 2. DVT Prophylaxis/Anticoagulation: SCDs.    vascular study negative -initiated sq lovenox 30 q12---12 week duration 3. Pain Management: Baclofen 10 mg 3 times  a day--titrated to 15mg  on Saturday. Valium qhs prn  -Lyrica 150 mg daily at bedtime---added am dose  -Cymbalta 30 mg daily.   -Hydrocodone and Robaxin as needed.  -initiate low dose pamelor at night to assist sleep and with dysesthesias   4. Mood: team to provide ego support, SW to follow up  -continued anxiety. Has big expectations of herself/recovery 5. Neuropsych: This patient is capable of making decisions on her own behalf. 6. Skin/Wound Care: Routine skin checks  -specialty air mattress in place 7. Fluids/Electrolytes/Nutrition: Routine I&O's with follow-up chemistries pending today. 8. Neurogenic bowel and bladder.    - continue foley for now---remove later this week  -PM soft/lax with AM suppository  -SMOG enema to evacuate bowels was effective. 9. Leukocytosis due to steroids.    LOS (Days) 5 A FACE TO FACE EVALUATION WAS PERFORMED  SWARTZ,ZACHARY T 10/23/2015 9:02 AM

## 2015-10-23 NOTE — Plan of Care (Signed)
Problem: SCI BOWEL ELIMINATION Goal: RH STG MANAGE BOWEL WITH ASSISTANCE STG Manage Bowel with Mod Assistance.  Outcome: Not Progressing Required SMOG enema and disimpaction

## 2015-10-23 NOTE — Progress Notes (Signed)
Recreational Therapy Session Note  Patient Details  Name: Jasmine EddyBrenda [name] MRN: [id number] Date of Birth: 1975/11/05 Today's Date: 10/23/2015  Pain: no c/o- c/o fatigue  Skilled Therapeutic Interventions/Progress Updates: Session focused on continued leisure screen with emphasis on relaxation training and identifying opportunities for relaxation during LOS.  Pt & husband present & participatory.  Therapy/Group: Individual Therapy  Climmie Buelow 10/23/2015, 12:25 PM

## 2015-10-24 ENCOUNTER — Inpatient Hospital Stay (HOSPITAL_COMMUNITY): Payer: BLUE CROSS/BLUE SHIELD | Admitting: Occupational Therapy

## 2015-10-24 ENCOUNTER — Inpatient Hospital Stay (HOSPITAL_COMMUNITY): Payer: BLUE CROSS/BLUE SHIELD

## 2015-10-24 NOTE — Progress Notes (Signed)
Occupational Therapy Session Note  Patient Details  Name: Jasmine [name] MRN: [id number] Date of Birth: 06/21/1975  Today's Date: 10/24/2015 OT Individual Time: [id number] OT Individual Time Calculation (min): 60 min    Short Term Goals: Week 1:  OT Short Term Goal 1 (Week 1): Pt will perform slide board transfer to drop arm commode with 1 person assist in order to decrease level of assist with functional transfers.  OT Short Term Goal 2 (Week 1): Pt will direct slide board transfer from bed <>wheelchair withouts cues from therapist in order to display knowledge and understanding of transfer sequence. OT Short Term Goal 3 (Week 1): Pt will perform UB dressing with mod A in order to decrease level of assist for functional tasks.  Skilled Therapeutic Interventions/Progress Updates: Patient sitting upright in chair upon approach for therapy and partiicpated in skilled OT as follows:    In w/c worked on supported trunk strengthing and stability with min to moderate assistance (fluctuated with fatigue)  Posterior and anterior tilts with moderate assistance  Stretching of tight left upper extremity muscless from shoulder and scapular to digitis to facilitate UE use and function  Demonstrated desensitization/sensory inducing techniques to increase sensation and decrease c/os prickly, painful or irritated sensations in UEs  (i.e. Various tectures pressed or rubbed onto hand and forearm - grubby terry cloth wash cloth, smooth, cold lotion container, pointed end of ink pen, edge of mouth moisturizer container, etc  Husband initiated and demonstrated  Pressure relief during the session by leaning [name] back in her w/c  Patient left sitting upright in her w/c with husband close by for support at end of the session     Therapy Documentation Precautions:  Precautions Precautions: Cervical Precaution Comments: orthostatic BPs Restrictions Weight Bearing Restrictions: No Pain: scattered and  varied with burning and/or irritating sensations also.   No meds requested during this session    See Function Navigator for Current Functional Status.   Therapy/Group: Individual Therapy  Bud Faceickett, Dameer Speiser [facility] 10/24/2015, 12:38 PM

## 2015-10-24 NOTE — Plan of Care (Signed)
Problem: SCI BLADDER ELIMINATION Goal: RH STG MANAGE BLADDER WITH ASSISTANCE STG Manage Bladder With mod Assistance  Outcome: Not Progressing Total A

## 2015-10-24 NOTE — Progress Notes (Signed)
Physical Therapy Session Note  Patient Details  Name: Jasmine EddyBrenda Sommerfeld MRN: [id number] Date of Birth: 05-Nov-1975  Today's Date: 10/24/2015 PT Individual Time: 1100-1200 PT Individual Time Calculation (min): 60 min   Short Term Goals: Week 1:  PT Short Term Goal 1 (Week 1): Pt will increase rolling R/L with side rails and mod A.  PT Short Term Goal 2 (Week 1): Pt will increase supine to edge of bed, edge of bed to supine to mod A and verbal cues.  PT Short Term Goal 3 (Week 1): Pt will increase transfers bed to chair to max A.  PT Short Term Goal 4 (Week 1): Pt will increase w/c mobility to min A about 50 feet.  PT Short Term Goal 5 (Week 1): Pt will ambulate about 10 feet with +2 max A.   Skilled Therapeutic Interventions/Progress Updates:   Session focused on functional transfer training with slideboard, w/c mobility training, and neuro re-ed for trunk control, balance training, functional use of BUE, weightbearing and motor control in RLE. Pt engaged in balance and neuro re-ed activities edge of mat including catching and tossing a ball with 1 UE progressing to both UE's and RLE kicks. Pt required min to mod assist for balance. Propped on elbow to sitting and back x 3 reps on each side with max assist to progress for bed mobility and for placement of board during transfers. Pt required +2 assist for transfers with multimodal cues for technique, weightshifting, and hand placement.  PT changed w/c from reclining w/c to regular manual w/c with specialty back for trunk support and modified L rim with theraband to increase grip. Pt able to propel with min assist though limited due to overall UE fatigue from use in therapy session. Left up in w/c with husband at bedside.   Therapy Documentation Precautions:  Precautions Precautions: Cervical Precaution Comments: orthostatic BPs Restrictions Weight Bearing Restrictions: No    Pain: Nerve pain present in BLE and trunk - premedicated.   See  Function Navigator for Current Functional Status.   Therapy/Group: Individual Therapy  Karolee StampsGray, Lashawn Bromwell Darrol PokeBrescia  Adeola Dennen B. Keiry Kowal, PT, DPT  10/24/2015, 12:21 PM

## 2015-10-24 NOTE — Plan of Care (Signed)
Problem: SCI BLADDER ELIMINATION Goal: RH STG MANAGE BLADDER WITH EQUIPMENT WITH ASSISTANCE STG Manage Bladder With Equipment With Mod Assistance  Outcome: Not Progressing Total A

## 2015-10-24 NOTE — Plan of Care (Signed)
Problem: SCI BOWEL ELIMINATION Goal: RH STG SCI MANAGE BOWEL PROGRAM W/ASSIST OR AS APPROPRIATE STG SCI Manage bowel program w/max assist or as appropriate.  Outcome: Progressing Total A

## 2015-10-24 NOTE — Plan of Care (Signed)
Problem: SCI BOWEL ELIMINATION Goal: RH STG MANAGE BOWEL WITH ASSISTANCE STG Manage Bowel with Mod Assistance.  Outcome: Not Progressing Patient requiring disimpaction/ enemas/ and medications   Problem: SCI BLADDER ELIMINATION Goal: RH STG MANAGE BLADDER WITH ASSISTANCE STG Manage Bladder With mod Assistance  Outcome: Not Progressing Patient has foley in place Goal: RH STG MANAGE BLADDER WITH EQUIPMENT WITH ASSISTANCE STG Manage Bladder With Equipment With Mod Assistance  Outcome: Not Progressing total

## 2015-10-24 NOTE — Progress Notes (Signed)
[facility] PHYSICAL MEDICINE & REHABILITATION     PROGRESS NOTE    Subjective/Complaints: Still having difficulties at night. Is excited however that left leg was moving more in therapy yesterday.   ROS: Pt denies fever, rash/itching, headache, blurred or double vision, nausea, vomiting, abdominal pain, diarrhea, chest pain, shortness of breath, palpitations, dysuria, dizziness,  bleeding, or depression   Objective: Vital Signs: Blood pressure 122/66, pulse 63, temperature 97.8 F (36.6 C), temperature source Oral, resp. rate 18, weight 68.675 kg (151 lb 6.4 oz), last menstrual period 09/19/2015, SpO2 97 %. No results found. No results for input(s): WBC, HGB, HCT, PLT in the last 72 hours. No results for input(s): NA, K, CL, GLUCOSE, BUN, CREATININE, CALCIUM in the last 72 hours.  Invalid input(s): CO CBG (last 3)  No results for input(s): GLUCAP in the last 72 hours.  Wt Readings from Last 3 Encounters:  10/19/15 68.675 kg (151 lb 6.4 oz)  10/11/15 68.6 kg (151 lb 3.8 oz)  09/18/15 64.411 kg (142 lb)    Physical Exam:  Constitutional: She is oriented to person, place, and time. She appears well-developed and well-nourished.  HENT: dentition normal, mucosa pink and moist Head: Normocephalic.  Right Ear: External ear normal.  Left Ear: External ear normal.  Eyes: Conjunctivae and EOM are normal.  Neck: Normal range of motion. Neck supple. No thyromegaly present.  Cardiovascular: Normal rate and regular rhythm.  Respiratory: Effort normal and breath sounds normal. No respiratory distress.  GI: Soft. Bowel sounds are normal. She exhibits no distension.  Musculoskeletal: She exhibits edema and tenderness.--tolerated manipulation of thighs better today  Neurological: She is alert and oriented to person, place, and time.  DTRs symmetric in bilateral upper extremities, 3+ in lowers etensor tone 3/4 bilateral lower exts, left leg tender Sensation decreased wrists  bilaterally left more than right. Motor: Right upper extremity: delt 5/5, bicep 5/5, tricep 4/5, wrist 4/5, HI 3.  Left upper extremity: Shoulder abduction, elbow flexion 4+ to 5/5, elbow extension 1/5, wrist extension 2/5, , finger grip 1/5 Left lower extremity---occasionally trace APF/ADF ---no change today Right lower extremity: HF and KE  1+/5, ADF/PF  1/5 Skin:  Neck incision clean and dry anterior neck.  Psychiatric: Her speech is normal. Thought content normal. Her mood is appropriate  Assessment/Plan: 1. 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 Physiatrist is providing close team supervision and 24 hour management of active medical problems listed below. Physiatrist and rehab team continue to assess barriers to discharge/monitor patient progress toward functional and medical goals.  Function:  Bathing Bathing position Bathing activity did not occur: Refused Position: Bed (LLB in bed; UB w/c at sink)  Bathing parts Body parts bathed by patient: Right arm, Left arm, Chest, Abdomen Body parts bathed by helper: Right upper leg, Left upper leg, Right lower leg, Left lower leg, Back  Bathing assist Assist Level: 2 helpers      Upper Body Dressing/Undressing Upper body dressing   What is the patient wearing?: Pull over shirt/dress     Pull over shirt/dress - Perfomed by patient: Thread/unthread right sleeve, Thread/unthread left sleeve, Put head through opening, Pull shirt over trunk Pull over shirt/dress - Perfomed by helper: Pull shirt over trunk        Upper body assist Assist Level: Touching or steadying assistance(Pt > 75%)      Lower Body Dressing/Undressing Lower body dressing  What is the patient wearing?: [employer], Shoes, Pants       Pants- Performed by helper: Thread/unthread right pants leg, Thread/unthread left pants leg, Pull pants  up/down   Non-skid slipper socks- Performed by helper: Don/doff right sock, Don/doff left sock       Shoes - Performed by helper: Don/doff right shoe, Don/doff left shoe, Fasten right, Fasten left       TED Hose - Performed by helper: Don/doff right TED hose, Don/doff left TED hose  Lower body assist Assist for lower body dressing: 2 Designer, multimedia activity did not occur: Safety/medical concerns        Toileting assist Assist level: Two helpers (per [employer], NT report)   Transfers Chair/bed transfer Chair/bed transfer activity did not occur: Safety/medical concerns Chair/bed transfer method: Lateral scoot Chair/bed transfer assist level: 2 helpers Chair/bed transfer assistive device: Sliding board, Armrests     Locomotion Ambulation Ambulation activity did not occur: Safety/medical Investment banker, operational activity did not occur: Safety/medical concerns Type: Manual Max wheelchair distance: 170 ft Assist Level: Touching or steadying assistance (Pt > 75%)  Cognition Comprehension Comprehension assist level: Follows complex conversation/direction with no assist  Expression Expression assist level: Expresses complex ideas: With no assist  Social Interaction Social Interaction assist level: Interacts appropriately with others with medication or extra time (anti-anxiety, antidepressant).  Problem Solving Problem solving assist level: Solves complex problems: With extra time  Memory Memory assist level: Complete Independence: No helper   Medical Problem List and Plan: 1. 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 Status post ACDF C4-5, 5-6.   -daily SCI education being provided  -continue therapies 2. DVT Prophylaxis/Anticoagulation: SCDs.    vascular study negative -initiated sq lovenox 30 q12---12 week duration 3. Pain  Management: Baclofen 10 mg 3 times a day--titrated to  on Saturday. Valium qhs prn  -Lyrica 150 mg daily at bedtime---added am dose  -Cymbalta 30 mg daily.   -Hydrocodone and Robaxin as needed.  -initiated low dose pamelor at night to assist sleep and with dysesthesias (anxiety a factor)   4. Mood: team to provide ego support, SW to follow up  -continued anxiety. Has big expectations of herself/recovery  -neuropsych consult at some point 5. Neuropsych: This patient is capable of making decisions on her own behalf. 6. Skin/Wound Care: Routine skin checks  -specialty air mattress in place--needs to be replaced today 7. Fluids/Electrolytes/Nutrition:  8. Neurogenic bowel and bladder.    - continue foley for now---remove tomorrow?  -PM soft/lax with AM suppository----working toward AM program  -  9. Leukocytosis due to steroids.    LOS (Days) 6 A FACE TO FACE EVALUATION WAS PERFORMED  Lashan Macias T 10/24/2015 9:06 AM

## 2015-10-24 NOTE — Plan of Care (Signed)
Problem: SCI BOWEL ELIMINATION Goal: RH STG SCI MANAGE BOWEL WITH MEDICATION WITH ASSISTANCE STG SCI Manage bowel with medication with Mod assistance.  Outcome: Not Progressing Total A

## 2015-10-24 NOTE — Progress Notes (Signed)
Suppository inserted @1822 . Pt laid on right side (per pt's request) to promote emptying. Small amount of stool in brief 15 mins after insertion. Pt dig stim to complete evacuation, resulting in a medium amount of dark brown soft stool . Pt tearful during procedure, stated that it was very humiliating. Additional educational and emotional support provided.

## 2015-10-24 NOTE — Progress Notes (Signed)
Occupational Therapy Session Note  Patient Details  Name: Jasmine [name] MRN: [id number] Date of Birth: 09/24/1975  Today's Date: 10/24/2015 OT Individual Time: 3086-57840730-0830 and 1303-1400 OT Individual Time Calculation (min): 60 min and 57 min   Short Term Goals: Week 1:  OT Short Term Goal 1 (Week 1): Pt will perform slide board transfer to drop arm commode with 1 person assist in order to decrease level of assist with functional transfers.  OT Short Term Goal 2 (Week 1): Pt will direct slide board transfer from bed <>wheelchair withouts cues from therapist in order to display knowledge and understanding of transfer sequence. OT Short Term Goal 3 (Week 1): Pt will perform UB dressing with mod A in order to decrease level of assist for functional tasks.  Skilled Therapeutic Interventions/Progress Updates:    Session One: Pt seen for OT ADL session focusing on self dressing and mobility. Pt in supine upon arrival, agreeable to tx session.  She declined bathing this session. Pants doffed with total A. Worked on positioning of LEs for pt to be able to reach and access LEs to don pants. Noted to have increased tone R>L affecting pt's ability to move LE independently. Pot positioned in modified circle sit position. Pillows placed around pt to assist with upright positioning. She was able to assist with threading pants, however, became easily fatigued and frustrated with difficulty of task. Educated regarding modified positions for task and encouragement provided. +2 required to roll and pull pants up. She transferred to EOB with max A +2, pt able to assist with bringing trunk up from EOB. She completed sliding board transfer with +2 assist, VCs required for hand placement and weight shift.    She completed grooming and UB dressing tasks seated in w/c at sink. Required assist to come forward off back of w/c initially, however, progressed to being able to pull on sink to bring self forward. Pt displayed  good problem solving abilities to manage oral care items with decreased gasping abilities.  She was left sitting in w/c at end of session, all needs in reach.   Session Two: Pt seen for OT tx session focusing on functional transfers, LE stretching, fine motor coordination, and education. Pt sitting up in w/c asleep, however, easily awoken. She voiced increased fatigue this afternoon, unsure of how much therapy she could tolerate, however, willing to attempt. She complained of increased "stiffness" in B LEs from sitting up in chair for long period of time. Transfers completed throughout session with max A +1 with +2 to steady equipment for safety. Pt positioned in supine on mat and LE ROM/ stretching completed to hip flexor and heel cords. Pt's husband very involved in tx sessions and will begin to perform supine stretches prior to AM tx sessions to assist with decreasing tone prior to tx session. Measured pt for leg loops in order to increase her independence with bed mobility and manage LEs.  She completed fine motor task picking up commonly used items. Used tenodesis grasp to pick up cup and with significantly increased time able to pick up ball point pen. Pt unable to pick up baseball due to weight of ball.  Pt returned to room at end of session, left sitting up in w/c with all needs in reach.   Therapy Documentation Precautions:  Precautions Precautions: Cervical Precaution Comments: orthostatic BPs Restrictions Weight Bearing Restrictions: No Pain:   No/ denies pain  See Function Navigator for Current Functional Status.   Therapy/Group: Individual Therapy  Lewis, Sylvestre Rathgeber C 10/24/2015, 7:09 AM

## 2015-10-25 ENCOUNTER — Inpatient Hospital Stay (HOSPITAL_COMMUNITY): Payer: BLUE CROSS/BLUE SHIELD

## 2015-10-25 ENCOUNTER — Inpatient Hospital Stay (HOSPITAL_COMMUNITY): Payer: BLUE CROSS/BLUE SHIELD | Admitting: Occupational Therapy

## 2015-10-25 NOTE — Progress Notes (Signed)
Occupational Therapy Session Note  Patient Details  Name: Jasmine EddyBrenda Petties MRN: [id number] Date of Birth: 1976-02-22  Today's Date: 10/25/2015 OT Individual Time:  1300-1345   (45 min)  OT Individual Time Calculation (min): 45 min    Short Term Goals: Week 1:  OT Short Term Goal 1 (Week 1): Pt will perform slide board transfer to drop arm commode with 1 person assist in order to decrease level of assist with functional transfers.  OT Short Term Goal 2 (Week 1): Pt will direct slide board transfer from bed <>wheelchair withouts cues from therapist in order to display knowledge and understanding of transfer sequence. OT Short Term Goal 3 (Week 1): Pt will perform UB dressing with mod A in order to decrease level of assist for functional tasks. Week 2:     Skilled Therapeutic Interventions/Progress Updates:    Seated in w/c.  Addressed neuro re-ed to address trunk control, weighshifts, balance.   Performed pressure relief with lateral and anterior leans with max assist.  Pt transferred to mat with sliding board +2.  Positioned on mat to address circle sitting with one leg at a time and then both legs.  Went slowly with LLE as this is one she has sciatica pain.  She tolerated circle sitting for 2 minutes and same with each leg.  Ppt able to lean forward and reach for object 6 inches.  Pt maintained sitting balance in circle sit with assistance needed (max) when lying back on wedge.  Pt transferred back to wc and taken to room.   Left pt in room  left with husband in room and all needs in reach.   Therapy Documentation Precautions:  Precautions Precautions: Cervical Precaution Comments: orthostatic BPs Restrictions Weight Bearing Restrictions: No       Pain:Sciatica pain with stretching and transfers; intermittent    See Function Navigator for Current Functional Status.   Therapy/Group: Individual Therapy  Humberto Sealsdwards, Daiana Vitiello J 10/25/2015, 12:13 PM

## 2015-10-25 NOTE — Progress Notes (Signed)
Physical Therapy Session Note  Patient Details  Name: Jasmine [name] MRN: [id number] Date of Birth: [birth year]  Today's Date: 10/25/2015 PT Individual Time: 0930-1030 PT Individual Time Calculation (min): 60 min   Skilled Therapeutic Interventions/Progress Updates:  Issued and discussed home measurement sheet to determine accessibility - they report only room that w/c wont fit downstairs is half bath but will measure anyway. Also discussed car options for transportation and determined [place] would be best option - discussed planning practice with real car at some point and he will measure seat to floor. Simulated car transfer training level surface with slideboard and total A of 1 for majority of transfer with +2 for placement and repositioning and max assist to manage each LE in/out of car. Pt propelled w/c with supervision to min assist as fatigued and during turns with cues for efficiency. Seated in w/c worked on neuro re-ed to address trunk control, weighshifts, balance, and fine motor control by grasping cups with 1 UE for balance. Performed dependent pressure relief at end of session and left with husband in room and all needs in reach.   Therapy Documentation Precautions:  Precautions Precautions: Cervical Precaution Comments: orthostatic BPs Restrictions Weight Bearing Restrictions: No  Pain:  nerve pain in BLE and trunk - premedicated.   See Function Navigator for Current Functional Status.   Therapy/Group: Individual Therapy  Karolee StampsGray, Jasmine Hupfer Darrol PokeBrescia  Tiffiney Sparrow B. Asiah Befort, PT, DPT  10/25/2015, 11:09 AM

## 2015-10-25 NOTE — Progress Notes (Signed)
Physical Therapy Weekly Progress Note  Patient Details  Name: Jasmine [name] MRN: [id number] Date of Birth: [birth year]  Beginning of progress report period: Oct 19, 2015 End of progress report period: Oct 25, 2015  Today's Date: 10/25/2015 PT Individual Time: 3086-5784 PT Individual Time Calculation (min): 70 min  Medication given during session by RN. Pt reports the same nerve pain in BLE and trunk. Pt fatigued from therapy today, but willing to participate in all activities to tolerance and rest breaks given throughout session. Functional slideboard transfers during session with +2 assist and cues for hand placement and anterior weightshift (on/off Nustep and then back to bed at end of session). Neuro re-ed on Nustep for BLE and LUE motor activation, reciprocal movement pattern retraining and overall muscular endurance x 7 min and 30 seconds on level 2 with 1 rest break. Pt able to maintain LE's without abduction modification until muscles too fatigued. Used adaptive hand splint for LUE support. Seated in w/c with minimal back support engaged in trunk control and UE neuro re-ed for coordination of movement and upright posture to engage in zoom ball activity x 4 rounds to tolerance with supervision for sitting balance. Transferred back to bed end of session and positioned in supine with pt rolling with mod assist for positioning. All needs in reach and husband at bedside.   Patient has met 2 of 5 short term goals. Pt is making good progress with overall mobility. Pt has progressed from tilt in space -> reclining -> manual w/c this week and tolerating well. Husband is trained in dependent boosting for pressure relief. Pt required +2 for slideboard transfers and focus on transfers and balance training. Pt with extensor tone in BLE as well as improving hypersensitivity in BLE and trunk. Plan for patient to have leg loops made to increase ability to manage BLE during mobility. Husband continues to be  supportive and participatory in therapies. Pt continues to be very motivated and works hard in all her therapy sessions.   Patient continues to demonstrate the following deficits: quadriplegia, decreased balance, decreased postural and trunk control, decreased activity tolerance, decreased strength, impaired sensation, and therefore will continue to benefit from skilled PT intervention to enhance overall performance with activity tolerance, balance, postural control, ability to compensate for deficits, functional use of  right upper extremity, right lower extremity, left upper extremity and left lower extremity and coordination.  Patient progressing toward long term goals..  Plan of care revisions: long term goals were modified. see care plan for details..  PT Short Term Goals Week 1:  PT Short Term Goal 1 (Week 1): Pt will increase rolling R/L with side rails and mod A.  PT Short Term Goal 1 - Progress (Week 1): Met PT Short Term Goal 2 (Week 1): Pt will increase supine to edge of bed, edge of bed to supine to mod A and verbal cues.  PT Short Term Goal 2 - Progress (Week 1): Not met PT Short Term Goal 3 (Week 1): Pt will increase transfers bed to chair to max A.  PT Short Term Goal 3 - Progress (Week 1): Not met PT Short Term Goal 4 (Week 1): Pt will increase w/c mobility to min A about 50 feet.  PT Short Term Goal 4 - Progress (Week 1): Met PT Short Term Goal 5 (Week 1): Pt will ambulate about 10 feet with +2 max A.  PT Short Term Goal 5 - Progress (Week 1): Not met Week 2:  PT Short Term  Goal 1 (Week 2): Pt will be able to perform propped on elbow <> sitting to progress bed mobility with mod assist PT Short Term Goal 2 (Week 2): Pt will be able to tolerate standing with lift equipment such as standing frame for postural control and weightbearing x 10 min PT Short Term Goal 3 (Week 2): Pt will be able to progress slideboard transfers with pt = 30% of transfer PT Short Term Goal 4 (Week 2): Pt  will be able to initiate use of leg loops for functional management of BLE  Skilled Therapeutic Interventions/Progress Updates:  Ambulation/gait training;Balance/vestibular training;Community reintegration;Discharge planning;Functional mobility training;Functional electrical stimulation;DME/adaptive equipment instruction;Neuromuscular re-education;Pain management;Patient/family education;Splinting/orthotics;Psychosocial support;Stair training;Therapeutic Activities;Therapeutic Exercise;UE/LE Coordination activities;UE/LE Strength taining/ROM;Wheelchair propulsion/positioning;Disease management/prevention   Therapy Documentation Precautions:  Precautions Precautions: Cervical Precaution Comments: orthostatic BPs Restrictions Weight Bearing Restrictions: No    See Function Navigator for Current Functional Status.  Therapy/Group: Individual Therapy  Canary Brim Ivory Broad, PT, DPT  10/25/2015, 3:32 PM

## 2015-10-25 NOTE — Progress Notes (Signed)
Occupational Therapy Session Note  Patient Details  Name: Jasmine [name] MRN: [id number] Date of Birth: [birth year]  Today's Date: 10/25/2015 OT Individual Time: 0800-0900 OT Individual Time Calculation (min): 60 min    Short Term Goals: Week 1:  OT Short Term Goal 1 (Week 1): Pt will perform slide board transfer to drop arm commode with 1 person assist in order to decrease level of assist with functional transfers.  OT Short Term Goal 2 (Week 1): Pt will direct slide board transfer from bed <>wheelchair withouts cues from therapist in order to display knowledge and understanding of transfer sequence. OT Short Term Goal 3 (Week 1): Pt will perform UB dressing with mod A in order to decrease level of assist for functional tasks.  Skilled Therapeutic Interventions/Progress Updates:    OT session focused on LB dressing, sitting balance, and functional transfers. Pt's husband reported providing BLE stretching prior to OT session. Pt declined dressing and bathing today however agreeable to simulate with theraband. OT focused on positioning of BLEs in modified circle sitting to don/doff band around BLEs. Pt transitioned to supine to manage band around waist via rolling L<>R with max A. Pt demonstrated minimal frustration with task and appeared to be motivated by success today. Completed SBT bed>w/c with mod A+2 and verbal cues for hand placement and weight shift. Pt completed oral care at sink with focus on anterior weight shift and trunk control. Pt practiced anterior/posterior weight shifts in chair 5x with focus on increasing core strength as OT did not allow her to pull self forward with BUEs. At end of session pt left sitting in w/c with husband present and all needs in reach.  Therapy Documentation Precautions:  Precautions Precautions: Cervical Precaution Comments: orthostatic BPs Restrictions Weight Bearing Restrictions: No General:   Vital Signs:   Pain:  No report of pain. ADL:    Exercises:   Other Treatments:    See Function Navigator for Current Functional Status.   Therapy/Group: Individual Therapy  Daneil Danerkinson, Heriberto Stmartin N 10/25/2015, 12:07 PM

## 2015-10-25 NOTE — Progress Notes (Signed)
Occupational Therapy Weekly Progress Note  Patient Details  Name: Jasmine [name] MRN: [id number] Date of Birth: 1975/10/21  Beginning of progress report period: Oct 19, 2015 End of progress report period: Oct 25, 2015   Patient has met 2 of 3 short term goals.  Pt making good progress towards OT goals. She requires +2 assist with all transfers. Have not attempted transfer to [facility] as pt does not have core stability/ sitting balance for toileting on BSC to be functional. Pt on foley catheter and incontinent of bowel.  She is able to correctly verbalize set-up of sliding board transfer, however, occasionally requires cues for proper weight shift and hand placement during transfers.   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  Patient progressing toward long term goals..  Continue plan of care.  OT Short Term Goals Week 1:  OT Short Term Goal 1 (Week 1): Pt will perform slide board transfer to drop arm commode with 1 person assist in order to decrease level of assist with functional transfers.  OT Short Term Goal 1 - Progress (Week 1): Not met OT Short Term Goal 2 (Week 1): Pt will direct slide board transfer from bed <>wheelchair withouts cues from therapist in order to display knowledge and understanding of transfer sequence. OT Short Term Goal 2 - Progress (Week 1): Progressing toward goal OT Short Term Goal 3 (Week 1): Pt will perform UB dressing with mod A in order to decrease level of assist for functional tasks. OT Short Term Goal 3 - Progress (Week 1): Met Week 2:  OT Short Term Goal 1 (Week 2): Pt will complete sliding board transfer with assist of 1 in order to decrease burden of care.  OT Short Term Goal 2 (Week 2): Pt will don pants with mod A  from bed level OT Short Term Goal 3 (Week 2): Pt will manage LEs into circle sit position with max A in prep for dressing tasks.    Therapy Documentation Precautions:  Precautions Precautions: Cervical Precaution Comments: orthostatic BPs Restrictions Weight Bearing Restrictions: No  See Function Navigator for Current Functional Status.   Therapy/Group: Individual Therapy  Lewis, Elyshia Kumagai C 10/25/2015, 6:59 PM

## 2015-10-25 NOTE — Progress Notes (Signed)
[place] PHYSICAL MEDICINE & REHABILITATION     PROGRESS NOTE    Subjective/Complaints: Had a better night. Husband was able to stretch her early this morning which helped.   ROS: Pt denies fever, rash/itching, headache, blurred or double vision, nausea, vomiting, abdominal pain, diarrhea, chest pain, shortness of breath, palpitations, dysuria, dizziness,  bleeding, or depression   Objective: Vital Signs: Blood pressure 128/81, pulse 63, temperature 98.2 F (36.8 C), temperature source Oral, resp. rate 16, weight 68.675 kg (151 lb 6.4 oz), last menstrual period 09/19/2015, SpO2 98 %. No results found. No results for input(s): WBC, HGB, HCT, PLT in the last 72 hours. No results for input(s): NA, K, CL, GLUCOSE, BUN, CREATININE, CALCIUM in the last 72 hours.  Invalid input(s): CO CBG (last 3)  No results for input(s): GLUCAP in the last 72 hours.  Wt Readings from Last 3 Encounters:  10/19/15 68.675 kg (151 lb 6.4 oz)  10/11/15 68.6 kg (151 lb 3.8 oz)  09/18/15 64.411 kg (142 lb)    Physical Exam:  Constitutional: She is oriented to person, place, and time. She appears well-developed and well-nourished.  HENT: dentition normal, mucosa pink and moist Head: Normocephalic.  Right Ear: External ear normal.  Left Ear: External ear normal.  Eyes: Conjunctivae and EOM are normal.  Neck: Normal range of motion. Neck supple. No thyromegaly present.  Cardiovascular: Normal rate and regular rhythm.  Respiratory: Effort normal and breath sounds normal. No respiratory distress.  GI: Soft. Bowel sounds are normal. She exhibits no distension.  Musculoskeletal: She exhibits edema and tenderness.--tolerated manipulation of thighs better today  Neurological: She is alert and oriented to person, place, and time.  DTRs symmetric in bilateral upper extremities, 3+ in lowers etensor tone 3/4 bilateral lower exts, left leg tender Sensation decreased wrists bilaterally left more than  right. Motor: Right upper extremity: delt 5/5, bicep 5/5, tricep 4/5, wrist 4/5, HI 3.  Left upper extremity: Shoulder abduction, elbow flexion 4+ to 5/5, elbow extension 1/5, wrist extension 2/5, , finger grip 1/5 Left lower extremity---occasionally trace APF/ADF -  Right lower extremity: HF and KE  2- to 2+/5, ADF/PF  1 to 1+/5 Skin:  Neck incision clean and dry anterior neck.  Psychiatric: Her speech is normal. Thought content normal. Her mood is appropriate  Assessment/Plan: 1. 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 Physiatrist is providing close team supervision and 24 hour management of active medical problems listed below. Physiatrist and rehab team continue to assess barriers to discharge/monitor patient progress toward functional and medical goals.  Function:  Bathing Bathing position Bathing activity did not occur: Refused Position: Bed (LLB in bed; UB w/c at sink)  Bathing parts Body parts bathed by patient: Right arm, Left arm, Chest, Abdomen Body parts bathed by helper: Right upper leg, Left upper leg, Right lower leg, Left lower leg, Back  Bathing assist Assist Level: 2 helpers      Upper Body Dressing/Undressing Upper body dressing   What is the patient wearing?: Pull over shirt/dress     Pull over shirt/dress - Perfomed by patient: Thread/unthread right sleeve, Thread/unthread left sleeve, Put head through opening, Pull shirt over trunk Pull over shirt/dress - Perfomed by helper: Pull shirt over trunk        Upper body assist Assist Level: Touching or steadying assistance(Pt > 75%)      Lower Body Dressing/Undressing Lower body dressing  What is the patient wearing?: American Family Insuranceed Hose, Shoes, Pants       Pants- Performed by helper: Thread/unthread right pants leg, Thread/unthread left pants leg, Pull pants up/down   Non-skid slipper socks-  Performed by helper: Don/doff right sock, Don/doff left sock       Shoes - Performed by helper: Don/doff right shoe, Don/doff left shoe, Fasten right, Fasten left       TED Hose - Performed by helper: Don/doff right TED hose, Don/doff left TED hose  Lower body assist Assist for lower body dressing: 2 Designer, multimediaHelpers      Toileting Toileting Toileting activity did not occur: Safety/medical concerns        Toileting assist Assist level: Two helpers (per Bank of AmericaMelissa Ward, NT report)   Transfers Chair/bed transfer Chair/bed transfer activity did not occur: Safety/medical concerns Chair/bed transfer method: Lateral scoot Chair/bed transfer assist level: 2 helpers Chair/bed transfer assistive device: Sliding board, Armrests     Locomotion Ambulation Ambulation activity did not occur: Safety/medical Investment banker, operationalconcerns         Wheelchair Wheelchair activity did not occur: Safety/medical concerns Type: Manual Max wheelchair distance: 170 ft Assist Level: Touching or steadying assistance (Pt > 75%)  Cognition Comprehension Comprehension assist level: Follows complex conversation/direction with no assist  Expression Expression assist level: Expresses complex ideas: With no assist  Social Interaction Social Interaction assist level: Interacts appropriately with others with medication or extra time (anti-anxiety, antidepressant).  Problem Solving Problem solving assist level: Solves complex problems: With extra time  Memory Memory assist level: Complete Independence: No helper   Medical Problem List and Plan: 1. 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 Status post ACDF C4-5, 5-6.   -making gradual neurological progress  -continue therapies 2. DVT Prophylaxis/Anticoagulation: SCDs.    vascular study negative -initiated sq lovenox 30 q12---12 week duration 3. Pain Management: Baclofen 10 mg 3 times a  day--titrated to 15mg  on Saturday. Valium qhs prn  -Lyrica 150 mg daily at bedtime---added am dose of 75mg   -Cymbalta 30 mg daily.   -Hydrocodone and Robaxin as needed.  -continue low dose pamelor at night to assist sleep and with dysesthesias 10mg  qhs   4. Mood: team to provide ego support, SW to follow up  -continued anxiety. Has big expectations of herself/recovery  -neuropsych consult at some point 5. Neuropsych: This patient is capable of making decisions on her own behalf. 6. Skin/Wound Care: Routine skin checks  -decubitus precautions/frequent turning 7. Fluids/Electrolytes/Nutrition:  8. Neurogenic bowel and bladder.    - continue foley until next week---not moving well enough yet and really has minimal bladder sensation at this point  -PM soft/lax with AM suppository----working toward AM program  -  9. Leukocytosis due to steroids.    LOS (Days) 7 A FACE TO FACE EVALUATION WAS PERFORMED  Saga Balthazar T 10/25/2015 9:58 AM

## 2015-10-25 NOTE — Progress Notes (Signed)
Suppository given @1801 . Pt positioned on R side, per pt's request. Nurse returned to room @1828 . Moderate amount of formed brown stool in brief prior to dig stim. Significant amount of stool still present in rectal vault. Pt dig stim, to evacuate, resulting in a large amount of soft stool.

## 2015-10-26 ENCOUNTER — Inpatient Hospital Stay (HOSPITAL_COMMUNITY): Payer: BLUE CROSS/BLUE SHIELD

## 2015-10-26 NOTE — Progress Notes (Signed)
Jasmine [name] is a 40 y.o. female [birth year] [id number]  Subjective: No new complaints. No new problems. Slept well. Feeling OK.  Objective: Vital signs in last 24 hours: Temp:  [97.6 F (36.4 C)-98.5 F (36.9 C)] 97.6 F (36.4 C) (05/20 0525) Pulse Rate:  [63-83] 63 (05/20 0525) Resp:  [18] 18 (05/20 0525) BP: (121-134)/(73-78) 134/78 mmHg (05/20 0525) SpO2:  [98 %-99 %] 98 % (05/20 0525) Weight change:  Last BM Date: 10/25/15  Intake/Output from previous day: 05/19 0701 - 05/20 0700 In: 720 [P.O.:720] Out: 1700 [Urine:1700] Last cbgs: CBG (last 3)  No results for input(s): GLUCAP in the last 72 hours.   Physical Exam General: No apparent distress   HEENT: not dry Lungs: Normal effort. Lungs clear to auscultation, no crackles or wheezes. Cardiovascular: Regular rate and rhythm, no edema Abdomen: S/NT/ND; BS(+) Musculoskeletal:  unchanged Neurological: No new neurological deficits Wounds: scar   Skin: clear   Mental state: Alert, oriented, cooperative    Lab Results: BMET    Component Value Date/Time   NA 134* 10/21/2015 0827   K 4.4 10/21/2015 0827   CL 100* 10/21/2015 0827   CO2 24 10/21/2015 0827   GLUCOSE 129* 10/21/2015 0827   BUN 24* 10/21/2015 0827   CREATININE 0.56 10/21/2015 0827   CALCIUM 9.1 10/21/2015 0827   GFRNONAA >60 10/21/2015 0827   GFRAA >60 10/21/2015 0827   CBC    Component Value Date/Time   WBC 17.6* 10/21/2015 0827   RBC 5.15* 10/21/2015 0827   HGB 14.5 10/21/2015 0827   HCT 43.6 10/21/2015 0827   PLT 360 10/21/2015 0827   MCV 84.7 10/21/2015 0827   MCH 28.2 10/21/2015 0827   MCHC 33.3 10/21/2015 0827   RDW 13.3 10/21/2015 0827   LYMPHSABS 1.3 10/21/2015 0827   MONOABS 0.5 10/21/2015 0827   EOSABS 0.0 10/21/2015 0827   BASOSABS 0.0 10/21/2015 0827    Studies/Results: No results found.  Medications: I have reviewed the patient's current medications.  Assessment/Plan:  1. Incomplete C5-6 SCI with resultant  tetraplegia secondary to cord compression C5-6, bilateral foraminal compromise. Status post ACDF C4-5, 5-6.   -making gradual neurological progress -continue therapies 2. DVT Prophylaxis/Anticoagulation: SCDs. vascular study negative -initiated sq lovenox 30 q12---12 week duration 3. Pain Management: Baclofen 10 mg 3 times a day--titrated to 15mg  on Saturday. Valium qhs prn -Lyrica 150 mg daily at bedtime---added am dose of 75mg  -Cymbalta 30 mg daily.  -Hydrocodone and Robaxin as needed. -continue low dose pamelor at night to assist sleep and with dysesthesias 10mg  qhs  4. Mood: team to provide ego support, SW to follow up -continued anxiety. Has big expectations of herself/recovery -neuropsych consult at some point 5. Neuropsych: This patient is capable of making decisions on her own behalf. 6. Skin/Wound Care: Routine skin checks -decubitus precautions/frequent turning 7. Fluids/Electrolytes/Nutrition:  8. Neurogenic bowel and bladder.  - continue foley until next week---not moving well enough yet and really has minimal bladder sensation at this point -PM soft/lax with AM suppository----working toward AM program -  9. Leukocytosis due to steroids.     Length of stay, days: 8  Sonda PrimesAlex Plotnikov , MD 10/26/2015, 9:06 AM

## 2015-10-26 NOTE — Progress Notes (Signed)
Occupational Therapy Session Note  Patient Details  Name: Jasmine [name] MRN: [id number] Date of Birth: 1976-05-28  Today's Date: 10/26/2015 OT Individual Time: 1330-1400 OT Individual Time Calculation (min): 30 min    Short Term Goals: Week 2:  OT Short Term Goal 1 (Week 2): Pt will complete sliding board transfer with assist of 1 in order to decrease burden of care.  OT Short Term Goal 2 (Week 2): Pt will don pants with mod A from bed level OT Short Term Goal 3 (Week 2): Pt will manage LEs into circle sit position with max A in prep for dressing tasks.   Skilled Therapeutic Interventions/Progress Updates:    OT session focused on NMR, trunk control, and functional transfers. Pt received supine in bed therefore OT provided passive stretching to BLEs in preparation for transfer. Completed supine>sit with max A, sitting EOB for 4 minutes with CGA-SBA for static sitting balance. Pt completed SBT bed>w/c with max A +2 (second person to manage w/c). Completed tenodesis grasp exercise 3x 10 reps with pt demonstrating full ROM. Continued focus with grasping, utilizing foam block to squeeze for 5 reps x3.   Therapy Documentation Precautions:  Precautions Precautions: Cervical Precaution Comments: orthostatic BPs Restrictions Weight Bearing Restrictions: No General:   Vital Signs: Therapy Vitals Temp: 97.9 F (36.6 C) Temp Source: Oral Pulse Rate: 90 Resp: 18 BP: 109/68 mmHg Patient Position (if appropriate): Sitting Oxygen Therapy SpO2: 97 % O2 Device: Not Delivered Pain:   ADL:   Exercises:   Other Treatments:    See Function Navigator for Current Functional Status.   Therapy/Group: Individual Therapy  Daneil Danerkinson, Clay Solum N 10/26/2015, 2:43 PM

## 2015-10-27 ENCOUNTER — Inpatient Hospital Stay (HOSPITAL_COMMUNITY): Payer: BLUE CROSS/BLUE SHIELD | Admitting: Occupational Therapy

## 2015-10-27 NOTE — Progress Notes (Signed)
Occupational Therapy Session Note  Patient Details  Name: Jasmine [name] MRN: [id number] Date of Birth: August 07, 1975  Today's Date: 10/27/2015 OT Individual Time:  -   0945-1015  (45 min)      Short Term Goals: Week 1:  OT Short Term Goal 1 (Week 1): Pt will perform slide board transfer to drop arm commode with 1 person assist in order to decrease level of assist with functional transfers.  OT Short Term Goal 1 - Progress (Week 1): Not met OT Short Term Goal 2 (Week 1): Pt will direct slide board transfer from bed <>wheelchair withouts cues from therapist in order to display knowledge and understanding of transfer sequence. OT Short Term Goal 2 - Progress (Week 1): Progressing toward goal OT Short Term Goal 3 (Week 1): Pt will perform UB dressing with mod A in order to decrease level of assist for functional tasks. OT Short Term Goal 3 - Progress (Week 1): Met Week 2:  OT Short Term Goal 1 (Week 2): Pt will complete sliding board transfer with assist of 1 in order to decrease burden of care.  OT Short Term Goal 2 (Week 2): Pt will don pants with mod A from bed level OT Short Term Goal 3 (Week 2): Pt will manage LEs into circle sit position with max A in prep for dressing tasks.  Week 3:     Skilled Therapeutic Interventions/Progress Updates:    OT session focused on NMR,  Pt received supine in bed.   OT provided passive stretching to LUE to facilitated tenodesis grip.  Completed tenodesis grasp exercise.    Instructed husband on promoting finger flexion at 1st digit and using rotation, traction to improve mobility.  Pt returned 80% full grasp; 25% extension.     Pt and husband .left in room with all items in reach.     Therapy Documentation Precautions:  Precautions Precautions: Cervical Precaution Comments: orthostatic BPs Restrictions Weight Bearing Restrictions: No General:   Vital Signs: Therapy Vitals Temp: 98.2 F (36.8 C) Temp Source: Oral Pulse Rate: 68 Resp:  18 BP: 129/77 mmHg Patient Position (if appropriate): Lying Oxygen Therapy SpO2: 99 % O2 Device: Not Delivered Pain:  none          :    See Function Navigator for Current Functional Status.   Therapy/Group: Individual Therapy  Lisa Roca 10/27/2015, 7:57 AM

## 2015-10-27 NOTE — Plan of Care (Signed)
Problem: SCI BOWEL ELIMINATION Goal: RH STG MANAGE BOWEL WITH ASSISTANCE STG Manage Bowel with Mod Assistance.  Outcome: Not Progressing Total A     

## 2015-10-27 NOTE — Progress Notes (Signed)
Jasmine [name] is a 40 y.o. female 10-14-1975 [id number]  Subjective: No new complaints. No new problems. Slept not too well. Feeling OK.  Objective: Vital signs in last 24 hours: Temp:  [97.9 F (36.6 C)-98.2 F (36.8 C)] 98.2 F (36.8 C) (05/21 0609) Pulse Rate:  [68-90] 68 (05/21 0609) Resp:  [18] 18 (05/21 0609) BP: (109-129)/(68-77) 129/77 mmHg (05/21 0609) SpO2:  [97 %-99 %] 99 % (05/21 0609) Weight change:  Last BM Date: 10/26/15  Intake/Output from previous day: 05/20 0701 - 05/21 0700 In: 720 [P.O.:720] Out: 1650 [Urine:1650] Last cbgs: CBG (last 3)  No results for input(s): GLUCAP in the last 72 hours.   Physical Exam General: No apparent distress  Awake HEENT: not dry Lungs: Normal effort. Lungs clear to auscultation, no crackles or wheezes. Cardiovascular: Regular rate and rhythm, no edema Abdomen: S/NT/ND; BS(+) Musculoskeletal:  unchanged Neurological: No new neurological deficits Wounds: scar   Skin: clear   Mental state: Alert, oriented, cooperative    Lab Results: BMET    Component Value Date/Time   NA 134* 10/21/2015 0827   K 4.4 10/21/2015 0827   CL 100* 10/21/2015 0827   CO2 24 10/21/2015 0827   GLUCOSE 129* 10/21/2015 0827   BUN 24* 10/21/2015 0827   CREATININE 0.56 10/21/2015 0827   CALCIUM 9.1 10/21/2015 0827   GFRNONAA >60 10/21/2015 0827   GFRAA >60 10/21/2015 0827   CBC    Component Value Date/Time   WBC 17.6* 10/21/2015 0827   RBC 5.15* 10/21/2015 0827   HGB 14.5 10/21/2015 0827   HCT 43.6 10/21/2015 0827   PLT 360 10/21/2015 0827   MCV 84.7 10/21/2015 0827   MCH 28.2 10/21/2015 0827   MCHC 33.3 10/21/2015 0827   RDW 13.3 10/21/2015 0827   LYMPHSABS 1.3 10/21/2015 0827   MONOABS 0.5 10/21/2015 0827   EOSABS 0.0 10/21/2015 0827   BASOSABS 0.0 10/21/2015 0827    Studies/Results: No results found.  Medications: I have reviewed the patient's current medications.  Assessment/Plan:  1. 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 Status post ACDF C4-5, 5-6.   -making gradual neurological progress -continue therapies 2. DVT Prophylaxis/Anticoagulation: SCDs. vascular study negative -initiated sq lovenox 30 q12---12 week duration 3. Pain Management: Baclofen 10 mg 3 times a day--titrated to 15mg  on Saturday. Valium qhs prn -Lyrica 150 mg daily at bedtime---added am dose of 75mg  -Cymbalta 30 mg daily.  -Hydrocodone and Robaxin as needed. -continue low dose pamelor at night to assist sleep and with dysesthesias 10mg  qhs  4. Mood: team to provide ego support, SW to follow up -continued anxiety. Has big expectations of herself/recovery -neuropsych consult at some point 5. Neuropsych: This patient is capable of making decisions on her own behalf. 6. Skin/Wound Care: Routine skin checks -decubitus precautions/frequent turning 7. Fluids/Electrolytes/Nutrition:  8. Neurogenic bowel and bladder.  - continue foley until next week---not moving well enough yet and really has minimal bladder sensation at this point -PM soft/lax with AM suppository----working toward AM program -  9. Leukocytosis due to steroids.   Cont current Rx  Length of stay, days: 9  Sonda PrimesAlex Plotnikov , MD 10/27/2015, 7:52 AM

## 2015-10-27 NOTE — Plan of Care (Signed)
Problem: SCI BLADDER ELIMINATION Goal: RH STG MANAGE BLADDER WITH ASSISTANCE STG Manage Bladder With mod Assistance  Outcome: Not Progressing Total A at this time

## 2015-10-27 NOTE — Plan of Care (Signed)
Problem: SCI BOWEL ELIMINATION Goal: RH STG SCI MANAGE BOWEL WITH MEDICATION WITH ASSISTANCE STG SCI Manage bowel with medication with Mod assistance.  Outcome: Not Progressing Total A     

## 2015-10-27 NOTE — Plan of Care (Signed)
Problem: SCI BOWEL ELIMINATION Goal: RH STG SCI MANAGE BOWEL PROGRAM W/ASSIST OR AS APPROPRIATE STG SCI Manage bowel program w/max assist or as appropriate.  Outcome: Not Progressing Total A at this time

## 2015-10-27 NOTE — Plan of Care (Signed)
Problem: RH PAIN MANAGEMENT Goal: RH STG PAIN MANAGED AT OR BELOW PT'S PAIN GOAL <4  Outcome: Progressing No c/o pain     

## 2015-10-28 ENCOUNTER — Encounter (HOSPITAL_COMMUNITY): Payer: BLUE CROSS/BLUE SHIELD

## 2015-10-28 ENCOUNTER — Inpatient Hospital Stay (HOSPITAL_COMMUNITY): Payer: BLUE CROSS/BLUE SHIELD | Admitting: Occupational Therapy

## 2015-10-28 ENCOUNTER — Inpatient Hospital Stay (HOSPITAL_COMMUNITY): Payer: BLUE CROSS/BLUE SHIELD

## 2015-10-28 DIAGNOSIS — F4321 Adjustment disorder with depressed mood: Secondary | ICD-10-CM

## 2015-10-28 NOTE — Progress Notes (Signed)
Physical Therapy Session Note  Patient Details  Name: Jasmine EddyBrenda Bourgoin MRN: [id number] Date of Birth: 12/04/1975  Today's Date: 10/28/2015 PT Individual Time: 1430-1530 PT Individual Time Calculation (min): 60 min   Short Term Goals: Week 2:  PT Short Term Goal 1 (Week 2): Pt will be able to perform propped on elbow <> sitting to progress bed mobility with mod assist PT Short Term Goal 2 (Week 2): Pt will be able to tolerate standing with lift equipment such as standing frame for postural control and weightbearing x 10 min PT Short Term Goal 3 (Week 2): Pt will be able to progress slideboard transfers with pt = 30% of transfer PT Short Term Goal 4 (Week 2): Pt will be able to initiate use of leg loops for functional management of BLE  Skilled Therapeutic Interventions/Progress Updates:   Focused on functional transfer training with slideboard with emphasis on pt participating more in transfers. Cues for hand placement and technique but for part of transfer (once positioned on slideboard with +2 assist), pt able to scoot with mod assist. On the second transfer required more max assist due to UE fatigue. Continue to recommend progressing pt's ability to assist with transfers to increase her confidence with her balance.  Neuro re-ed on edge of mat to perform zoom ball activity to address UE coordination and strengthening as well as trunk control and balance (min assist overall) several repetitions and increasing in speed. Rest breaks as needed. Statically pt able to maintain balance with close supervision.  W/c mobility training in hallway and through obstacle course to work on turning and maneuvering of w/c in smaller spaces. Functional reaching task from w/c to promote anterior lean and balance from seated position to pick up cones with RUE.   Therapy Documentation Precautions:  Precautions Precautions: Cervical Precaution Comments: orthostatic BPs Restrictions Weight Bearing Restrictions:  No  Pain: Medication given during session. Pt complaints of L scapular pain - kinesiotape applied for postural support.   See Function Navigator for Current Functional Status.   Therapy/Group: Individual Therapy  Karolee StampsGray, Tuyet Bader Darrol PokeBrescia  Zackaria Burkey B. Zhi Geier, PT, DPT  10/28/2015, 3:54 PM

## 2015-10-28 NOTE — Progress Notes (Signed)
Occupational Therapy Session Note  Patient Details  Name: Jasmine [name] MRN: [id number] Date of Birth: July 08, 1975  Today's Date: 10/28/2015 OT Individual Time: 0730-0900 OT Individual Time Calculation (min): 90 min    Short Term Goals: Week 2:  OT Short Term Goal 1 (Week 2): Pt will complete sliding board transfer with assist of 1 in order to decrease burden of care.  OT Short Term Goal 2 (Week 2): Pt will don pants with mod A from bed level OT Short Term Goal 3 (Week 2): Pt will manage LEs into circle sit position with max A in prep for dressing tasks.   Skilled Therapeutic Interventions/Progress Updates:    Pt seen for OT ADL bathing/dressing session. Pt in supine upon arrival with husband present performing LB stretches.  Extensive time spent with positioning in bed in prep for LB bathing/dressing tasks. Pt able to tolerate modified circle sit position in order to wash B feet/ legs and thread pants. Occasional steadying assist required to maintain position and VCs for problem solving. Pt voiced slight discomfort with stretch in B hip flexors with circle sit position, however, was able to tolerate for dressing task.  She transferred to EOB with max A and VCs for bed mobility technique. She completed max A sliding board transfer to w/c with +2 available to steady equipment. Pt's husband able to provide the +2 assist and education and demonstration provided for set-up of sliding board in prep for transfer. She completed UB bathing/dressing seated in w/c at sink. Assist required to open containers and increased effort and concentration required to grasp and wring out washcloths. Increased time with cues for problem solving donning/ doffing of shirt. Reviewed hemi technique as pt's R grasp/ functional use is stronger than L UE.  Grooming completed at sink with increased time. Reviews AAROM/PROM for hands/ fingers. Pt with improved tenodesis grasp in L hand, however cont to need to work on  strengthening to increase functionality of grasp.  Pt left sitting in w/c at end of session, all needs in reach and husband present. Discussed with pt toileting options for home and while here on CIR. Suspecting pt will need at least +2 assist for [facility] toilet transfer and for clothing management/ hygiene if she were to complete task [facility]. If completed at bed level would only require assist +1. Pt not wanting her kids to have to assist with toileting. If pt is not able to improve with mobility and sitting balance/ stability may d/c toileting goal.    Educated regarding SCI injury levels and rate of return. Pt and pt's husband very active and verbal throughout tx sessions asking appropriate questions and desiring to complete as much hands on assistance as possible.   Therapy Documentation Precautions:  Precautions Precautions: Cervical Precaution Comments: orthostatic BPs Restrictions Weight Bearing Restrictions: No Pain:   No/ denies pain  See Function Navigator for Current Functional Status.   Therapy/Group: Individual Therapy  Lewis, [name] 10/28/2015, 7:00 AM

## 2015-10-28 NOTE — Progress Notes (Signed)
Occupational Therapy Session Note  Patient Details  Name: Jasmine [name] MRN: [id number] Date of Birth: September 06, 1975  Today's Date: 10/28/2015 OT Individual Time: 1330-1400 OT Individual Time Calculation (min): 30 min    Short Term Goals: Week 2:  OT Short Term Goal 1 (Week 2): Pt will complete sliding board transfer with assist of 1 in order to decrease burden of care.  OT Short Term Goal 2 (Week 2): Pt will don pants with mod A from bed level OT Short Term Goal 3 (Week 2): Pt will manage LEs into circle sit position with max A in prep for dressing tasks.   Skilled Therapeutic Interventions/Progress Updates:    Pt seen for OT session focusing on functional transfers, sitting balance, and education. Pt sitting up in w/c upon arrival, agreeable to tx session.  Completed maximove transfer to [facility] to assess functional sitting balance on BSC. BSC placed against wall for pt to have back support. Pt able to sit with B UE support on BSC, however, voiced being very uncomfortatble sitting on it and not feeling confident on toilet. Reviewed toileting tasks talked about in previous session. Pt okay with using bed pan at this time until balance improves. Pt returned to room at end of session, left sitting in w/c at end of session.  Pt's husband present and participated in therapy session.   Therapy Documentation Precautions:  Precautions Precautions: Cervical Precaution Comments: orthostatic BPs Restrictions Weight Bearing Restrictions: No Pain: Pain Assessment Pain Score: 4   See Function Navigator for Current Functional Status.   Therapy/Group: Individual Therapy  Lewis, Kimber Esterly C 10/28/2015, 11:25 AM

## 2015-10-28 NOTE — Progress Notes (Signed)
[place] PHYSICAL MEDICINE & REHABILITATION     PROGRESS NOTE    Subjective/Complaints: Doing better at night. Helps that husband turns her. Spasticity seems better   ROS: Pt denies fever, rash/itching, headache, blurred or double vision, nausea, vomiting, abdominal pain, diarrhea, chest pain, shortness of breath, palpitations, dysuria, dizziness,  bleeding, or depression   Objective: Vital Signs: Blood pressure 123/75, pulse 78, temperature 97.7 F (36.5 C), temperature source Oral, resp. rate 18, weight 68.675 kg (151 lb 6.4 oz), last menstrual period 09/19/2015, SpO2 97 %. No results found. No results for input(s): WBC, HGB, HCT, PLT in the last 72 hours. No results for input(s): NA, K, CL, GLUCOSE, BUN, CREATININE, CALCIUM in the last 72 hours.  Invalid input(s): CO CBG (last 3)  No results for input(s): GLUCAP in the last 72 hours.  Wt Readings from Last 3 Encounters:  10/19/15 68.675 kg (151 lb 6.4 oz)  10/11/15 68.6 kg (151 lb 3.8 oz)  09/18/15 64.411 kg (142 lb)    Physical Exam:  Constitutional: She is oriented to person, place, and time. She appears well-developed and well-nourished.  HENT: dentition normal, mucosa pink and moist Head: Normocephalic.  Right Ear: External ear normal.  Left Ear: External ear normal.  Eyes: Conjunctivae and EOM are normal.  Neck: Normal range of motion. Neck supple. No thyromegaly present.  Cardiovascular: Normal rate and regular rhythm.  Respiratory: Effort normal and breath sounds normal. No respiratory distress.  GI: Soft. Bowel sounds are normal. She exhibits no distension.  Musculoskeletal: She exhibits edema and tenderness.--tolerated manipulation of thighs better today  Neurological: She is alert and oriented to person, place, and time.  DTRs symmetric in bilateral upper extremities, 3+ in lowers etensor tone 3/4 bilateral lower exts, left leg tender Sensation decreased wrists bilaterally left more than  right. Motor: Right upper extremity: delt 5/5, bicep 5/5, tricep 4/5, wrist 4/5, HI 3.  Left upper extremity: Shoulder abduction, elbow flexion 4+ to 5/5, elbow extension 1/5, wrist extension 2/5, , finger grip 1/5 Left lower extremity---occasionally trace APF/ADF. Saw trace KE/KF today also. Improved sitting balance-  Right lower extremity: HF and KE  2- to 2+/5, ADF/PF  1 to 1+/5 Skin:  Neck incision clean and dry anterior neck.  Psychiatric: Her speech is normal. Thought content normal. Her mood is appropriate  Assessment/Plan: 1. 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 Physiatrist is providing close team supervision and 24 hour management of active medical problems listed below. Physiatrist and rehab team continue to assess barriers to discharge/monitor patient progress toward functional and medical goals.  Function:  Bathing Bathing position Bathing activity did not occur: Refused Position:  (LB in bed; UB w/c at sink)  Bathing parts Body parts bathed by patient: Right upper leg, Left upper leg, Right lower leg, Left lower leg, Right arm, Left arm, Chest, Abdomen Body parts bathed by helper: Back  Bathing assist Assist Level: Touching or steadying assistance(Pt > 75%)      Upper Body Dressing/Undressing Upper body dressing   What is the patient wearing?: Pull over shirt/dress     Pull over shirt/dress - Perfomed by patient: Thread/unthread right sleeve, Thread/unthread left sleeve, Put head through opening, Pull shirt over trunk Pull over shirt/dress - Perfomed by helper: Pull shirt over trunk        Upper body assist Assist Level: Supervision or verbal cues      Lower  Body Dressing/Undressing Lower body dressing   What is the patient wearing?: Ted Hose, Shoes, Pants     Pants- Performed by patient: Thread/unthread right pants leg,  Thread/unthread left pants leg Pants- Performed by helper: Pull pants up/down   Non-skid slipper socks- Performed by helper: Don/doff right sock, Don/doff left sock       Shoes - Performed by helper: Don/doff right shoe, Don/doff left shoe, Fasten right, Fasten left       TED Hose - Performed by helper: Don/doff right TED hose, Don/doff left TED hose  Lower body assist Assist for lower body dressing: 2 Designer, multimedia activity did not occur: No continent bowel/bladder event        Toileting assist Assist level: Two helpers (per [employer], NT report)   Transfers Chair/bed transfer Chair/bed transfer activity did not occur: Safety/medical concerns Chair/bed transfer method: Lateral scoot Chair/bed transfer assist level: 2 helpers Chair/bed transfer assistive device: Sliding board, Armrests     Locomotion Ambulation Ambulation activity did not occur: Safety/medical Investment banker, operational activity did not occur: Safety/medical concerns Type: Manual Max wheelchair distance: 200' Assist Level: Touching or steadying assistance (Pt > 75%)  Cognition Comprehension Comprehension assist level: Follows complex conversation/direction with no assist  Expression Expression assist level: Expresses complex ideas: With no assist  Social Interaction Social Interaction assist level: Interacts appropriately with others - No medications needed.  Problem Solving Problem solving assist level: Solves complex problems: Recognizes & self-corrects  Memory Memory assist level: Complete Independence: No helper   Medical Problem List and Plan: 1. 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 Status post ACDF C4-5, 5-6.   -pt remains motivated. Husband supportive  -continue therapies 2. DVT Prophylaxis/Anticoagulation: SCDs.    vascular study  negative -initiated sq lovenox 30 q12---12 week duration 3. Pain Management: Baclofen 10 mg 3 times a day--titrated to  on Saturday. Valium qhs prn  -Lyrica 150 mg daily at bedtime---added am dose of   -Cymbalta 30 mg daily.   -Hydrocodone and Robaxin as needed.  -continue low dose pamelor at night    qhs   4. Mood: team to provide ego support, SW to follow up  -anxiety with some improvement. Has big expectations of herself/recovery  -neuropsych consult at some point 5. Neuropsych: This patient is capable of making decisions on her own behalf. 6. Skin/Wound Care: Routine skin checks  -decubitus precautions/frequent turning 7. Fluids/Electrolytes/Nutrition:  8. Neurogenic bowel and bladder.    - continue foley until next week---not moving well enough yet and really has minimal bladder sensation at this point  -PM soft/lax with AM suppository----having pm bm's despite AM program. Will discuss with RN  -  9. Leukocytosis due to steroids.    LOS (Days) 10 A FACE TO FACE EVALUATION WAS PERFORMED  Jasmine [name] 10/28/2015 9:15 AM

## 2015-10-28 NOTE — Patient Care Conference (Signed)
Inpatient RehabilitationTeam Conference and Plan of Care Update Date: 10/22/2015   Time: 2:10 PM    Patient Name: Jasmine [name]      Medical Record Number: [id number]  Date of Birth: 11-20-1975 Sex: Female         Room/Bed: 4W25C/4W25C-01 Payor Info: Payor: BLUE CROSS BLUE SHIELD / Plan: BCBS OTHER / Product Type: *No Product type* /    Admitting Diagnosis: CERVICAL STENOSIS WITH MYELOPATHY  Admit Date/Time:  10/18/2015  6:46 PM Admission Comments: No comment available   Primary Diagnosis:  Spinal cord injury at C5-C7 level without injury of spinal bone (HCC) Principal Problem: Spinal cord injury at C5-C7 level without injury of spinal bone [facility]CC)  Patient Active Problem List   Diagnosis Date Noted  . Neurogenic bowel 10/21/2015  . Neurogenic bladder 10/21/2015  . 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 Elective surgery   . Postoperative pain   . Respiratory depression   . Leukocytosis   . Generalized OA   . Allodynia   . Spinal cord injury at C5-C7 level without injury of spinal bone (HCC)   . HNP (herniated nucleus pulposus), cervical 10/10/2015  . Fusion of spine of cervical region 10/10/2015  . Acute paraplegia (HCC) 10/10/2015  . Neural foraminal stenosis of cervical spine 09/19/2015  . Cervical radiculopathy due to degenerative joint disease of spine 09/17/2015  . Left shoulder pain 09/16/2015  . Neck pain 09/16/2015  . Right hand weakness 08/06/2015  . Right shoulder pain 04/12/2015  . Insomnia 04/12/2015  . Adjustment disorder with mixed anxiety and depressed mood 03/13/2013  . Chronic lumbar radiculopathy 06/16/2011    Expected Discharge Date: Expected Discharge Date: 11/15/15  Team Members Present: Physician leading conference: Dr. Faith RogueZachary Swartz Social Worker Present: Amada JupiterLucy Karla Pavone, LCSW Nurse Present: Ronny BaconWhitney Reardon, RN PT Present: Karolee StampsAlison Gray, PT OT Present: Johnsie CancelAmy Lewis, OT SLP Present: Feliberto Gottronourtney Payne, SLP PPS Coordinator present : Tora DuckMarie Noel, RN, CRRN    Current Status/Progress Goal Weekly Team Focus  Medical   C6 SCI incomplete--showing neruo improvement.  patient motivated. pain/spasticity issues  improve activity tolerance/ education  spasticity mgt, establishing bowel program, skin care, pain control, SCI education   Bowel/Bladder   foley and LBM: 10/21/15 with soap suds enema and disimpaction.  A large amount of stool was removed, but it was still hard  establish a bowel program   establish a bowel program, remove foley?   Swallow/Nutrition/ Hydration             ADL's   Max-Total A LB bathing/ dressing; +2 sliding board transfers  Min A overall  ADL re-training; SCK education; neuro re-ed;  Upright tolerance;  sliding board transfers   Mobility   +2 for bed mobility and slideboard transfers; using TIS w/c for OOB toleralnce; min to total A for sitting balance  min assist w/c level transfers; therapeutic ambulation; mod I w/c mobility  education, pain management, transfers, balance training, neuro re-ed, endurance, bed mobility   Communication             Safety/Cognition/ Behavioral Observations            Pain   6/10-8/10 ususally from waist down.  All pins and needles. t norco effective  2/10  premedicate before therapies, and bedtime   Skin   anterior neck incision with skin glue, otherwise CDI  remain free from infection/ breakdown with total assist   reposition, and turn q2    Rehab Goals Patient on target to meet rehab goals: Yes *  See Care Plan and progress notes for long and short-term goals.  Barriers to Discharge: patient presented for surgery without neurological deficits---incomplete SCI after---pt/husband struggling with "why"    Possible Resolutions to Barriers:  continued educaition, adpative equipment, expect further recovery as she's demonstrating    Discharge Planning/Teaching Needs:  Home with family.  Husband and son planning to adjust work shifts in order to provide 24/7 care  ongoing   Team  Discussion:  Incomplete tetraplegia and showing improvement.  May consider foley out end of the week;  Working on bowel program.  +2 sl board tfs and max to roll.  Min assist w/c level goals.  Family to coordinate 24/7 assistance  Revisions to Treatment Plan:  None   Continued Need for Acute Rehabilitation Level of Care: The patient requires daily medical management by a physician with specialized training in physical medicine and rehabilitation for the following conditions: Daily direction of a multidisciplinary physical rehabilitation program to ensure safe treatment while eliciting the highest outcome that is of practical value to the patient.: Yes Daily medical management of patient stability for increased activity during participation in an intensive rehabilitation regime.: Yes Daily analysis of laboratory values and/or radiology reports with any subsequent need for medication adjustment of medical intervention for : Neurological problems;Post surgical problems;Wound care problems  Leonda Cristo 10/22/2015, 3:31 PM

## 2015-10-28 NOTE — Progress Notes (Signed)
Physical Therapy Session Note  Patient Details  Name: Jasmine [name] MRN: [id number] Date of Birth: 10/29/1975  Today's Date: 10/28/2015 PT Individual Time: 1100-1200 PT Individual Time Calculation (min): 60 min   Short Term Goals: Week 2:  PT Short Term Goal 1 (Week 2): Pt will be able to perform propped on elbow <> sitting to progress bed mobility with mod assist PT Short Term Goal 2 (Week 2): Pt will be able to tolerate standing with lift equipment such as standing frame for postural control and weightbearing x 10 min PT Short Term Goal 3 (Week 2): Pt will be able to progress slideboard transfers with pt = 30% of transfer PT Short Term Goal 4 (Week 2): Pt will be able to initiate use of leg loops for functional management of BLE  Skilled Therapeutic Interventions/Progress Updates:    Pt's husband brought in household measurements - only area that will be an issue is the bathroom due to narrow doorway and they report no space once inside to move w/c around. Discussed overall options for hospital bed vs. Moving a lower bed downstairs as well. Both are still hopeful she will not need a w/c upon d/c - encouraged optimism but to also be planning for worst case scenario to be realistic and both agreed.  Rest of session focused on neuro re-ed for reciprocal movement pattern retraining on Nustep x 30 min on level 2 and adaptive L hand splint. Pt able to go without rest breaks this time and able to maintain knees in neutral position without facilitation demonstrating improved motor control. Performed max assist transfers on/off Nustep with +2 to position slideboard (husband assisting) with multimodal cues for hand placement, head/hips relationship, and technique.   Therapy Documentation Precautions:  Precautions Precautions: Cervical Precaution Comments: orthostatic BPs Restrictions Weight Bearing Restrictions: No  Pain: Premedicated for nerve pain.    See Function Navigator for Current  Functional Status.   Therapy/Group: Individual Therapy  Karolee StampsGray, Kalle Bernath Darrol PokeBrescia  Jeda Pardue B. Gavriel Holzhauer, PT, DPT  10/28/2015, 12:03 PM

## 2015-10-29 ENCOUNTER — Inpatient Hospital Stay (HOSPITAL_COMMUNITY): Payer: BLUE CROSS/BLUE SHIELD

## 2015-10-29 ENCOUNTER — Inpatient Hospital Stay (HOSPITAL_COMMUNITY): Payer: BLUE CROSS/BLUE SHIELD | Admitting: Occupational Therapy

## 2015-10-29 DIAGNOSIS — F4321 Adjustment disorder with depressed mood: Secondary | ICD-10-CM | POA: Insufficient documentation

## 2015-10-29 NOTE — Progress Notes (Signed)
[facility] PHYSICAL MEDICINE & REHABILITATION     PROGRESS NOTE    Subjective/Complaints: A little emotional today over her perceived lack of tolerance. Having some blurred vision in the right eye when she lies on that side for ROM or bowel program.  ROS: Pt denies fever, rash/itching, headache, blurred or double vision, nausea, vomiting, abdominal pain, diarrhea, chest pain, shortness of breath, palpitations, dysuria, dizziness,  bleeding, or depression   Objective: Vital Signs: Blood pressure 129/71, pulse 67, temperature 98.5 F (36.9 C), temperature source Oral, resp. rate 16, weight 68.675 kg (151 lb 6.4 oz), last menstrual period 09/19/2015, SpO2 99 %. No results found. No results for input(s): WBC, HGB, HCT, PLT in the last 72 hours. No results for input(s): NA, K, CL, GLUCOSE, BUN, CREATININE, CALCIUM in the last 72 hours.  Invalid input(s): CO CBG (last 3)  No results for input(s): GLUCAP in the last 72 hours.  Wt Readings from Last 3 Encounters:  10/19/15 68.675 kg (151 lb 6.4 oz)  10/11/15 68.6 kg (151 lb 3.8 oz)  09/18/15 64.411 kg (142 lb)    Physical Exam:  Constitutional: She is oriented to person, place, and time. She appears well-developed and well-nourished.  HENT: dentition normal, mucosa pink and moist Head: Normocephalic.  Right Ear: External ear normal.  Left Ear: External ear normal.  Eyes: Conjunctivae and EOM are normal.  Neck: Normal range of motion. Neck supple. No thyromegaly present.  Cardiovascular: Normal rate and regular rhythm.  Respiratory: Effort normal and breath sounds normal. No respiratory distress.  GI: Soft. Bowel sounds are normal. She exhibits no distension.  Musculoskeletal: She exhibits edema and tenderness.--tolerated manipulation of thighs better today  Neurological: She is alert and oriented to person, place, and time.  DTRs symmetric in bilateral upper extremities, 3+ in lowers etensor tone 3/4 bilateral lower exts,  left leg tender Sensation decreased wrists bilaterally left more than right. Motor: Right upper extremity: delt 5/5, bicep 5/5, tricep 4/5, wrist 4/5, HI 3.  Left upper extremity: Shoulder abduction, elbow flexion 4+ to 5/5, elbow extension 1/5, wrist extension 2/5, , finger grip 1/5 Left lower extremity---occasionally trace APF/ADF. Saw trace KE/KF today also. Improved sitting balance-  Right lower extremity: HF and KE  2- to 2+/5, ADF/PF  1 to 1+/5 Skin:  Neck incision clean and dry anterior neck.  Psychiatric: Her speech is normal. Thought content normal. Her mood is appropriate  Assessment/Plan: 1. 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 Physiatrist is providing close team supervision and 24 hour management of active medical problems listed below. Physiatrist and rehab team continue to assess barriers to discharge/monitor patient progress toward functional and medical goals.  Function:  Bathing Bathing position Bathing activity did not occur: Refused Position:  (LB in bed; UB w/c at sink)  Bathing parts Body parts bathed by patient: Right upper leg, Left upper leg, Right lower leg, Left lower leg, Right arm, Left arm, Chest, Abdomen Body parts bathed by helper: Back  Bathing assist Assist Level: Touching or steadying assistance(Pt > 75%)      Upper Body Dressing/Undressing Upper body dressing   What is the patient wearing?: Pull over shirt/dress     Pull over shirt/dress - Perfomed by patient: Thread/unthread right sleeve, Thread/unthread left sleeve, Put head through opening, Pull shirt over trunk Pull over shirt/dress - Perfomed by helper: Pull shirt over trunk  Upper body assist Assist Level: Supervision or verbal cues      Lower Body Dressing/Undressing Lower body dressing   What is the patient wearing?: Ted Hose, Shoes, Pants      Pants- Performed by patient: Thread/unthread right pants leg, Thread/unthread left pants leg Pants- Performed by helper: Pull pants up/down   Non-skid slipper socks- Performed by helper: Don/doff right sock, Don/doff left sock       Shoes - Performed by helper: Don/doff right shoe, Don/doff left shoe, Fasten right, Fasten left       TED Hose - Performed by helper: Don/doff right TED hose, Don/doff left TED hose  Lower body assist Assist for lower body dressing: 2 Designer, multimedia activity did not occur: No continent bowel/bladder event        Toileting assist Assist level: Two helpers   Transfers Chair/bed transfer Chair/bed transfer activity did not occur: Safety/medical concerns Chair/bed transfer method: Lateral scoot Chair/bed transfer assist level: 2 helpers Chair/bed transfer assistive device: Sliding board, Armrests     Locomotion Ambulation Ambulation activity did not occur: Safety/medical Investment banker, operational activity did not occur: Safety/medical concerns Type: Manual Max wheelchair distance: 175' Assist Level: Touching or steadying assistance (Pt > 75%)  Cognition Comprehension Comprehension assist level: Follows complex conversation/direction with no assist  Expression Expression assist level: Expresses complex ideas: With no assist  Social Interaction Social Interaction assist level: Interacts appropriately with others - No medications needed.  Problem Solving Problem solving assist level: Solves complex problems: Recognizes & self-corrects  Memory Memory assist level: Complete Independence: No helper   Medical Problem List and Plan: 1. 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 Status post ACDF C4-5, 5-6.   -team conference today  -discussed expectations regarding recovery and prognosis 2. DVT Prophylaxis/Anticoagulation:  SCDs.    vascular study negative -initiated sq lovenox 30 q12---12 week duration 3. Pain Management: Baclofen 10 mg 3 times a day--titrated to  on Saturday. Valium qhs prn  -Lyrica 150 mg daily at bedtime---added am dose of   -Cymbalta 30 mg daily.   -Hydrocodone and Robaxin as needed.  -continue low dose pamelor at night    qhs   4. Mood: team to provide ego support,    -anxiety with some improvement. Has big expectations of herself/recovery  -neuropsych consult at some point 5. Neuropsych: This patient is capable of making decisions on her own behalf. 6. Skin/Wound Care: Routine skin checks  -decubitus precautions/frequent turning 7. Fluids/Electrolytes/Nutrition:  8. Neurogenic bowel and bladder.    -discuss foley with team---consider removing tomorrow  -PM soft/lax with AM suppository---- PM bm's however  -  9. Leukocytosis due to steroids.    LOS (Days) 11 A FACE TO FACE EVALUATION WAS PERFORMED  Willie Plain T 10/29/2015 9:17 AM

## 2015-10-29 NOTE — Consult Note (Signed)
NEUROBEHAVIORAL STATUS EXAM - [facility]   MEDICAL NECESSITY:  Jasmine [name] was seen on the [facility] for a neurobehavioral status exam to assist in treatment planning during admission.   Patient is a40 y.o. right handed female admitted 10/10/2015 with progressive severe neck pain radiating to left upper extremity since a fall from a ladder 09/13/2015. Patient lives with spouse and children ages 28 and 84. Independent prior to admission. 2 level home with bedroom upstairs. MRI showed cord signal at the disc space at C5-6 cord compression, a large osteophyte disc at that level, bilateral foraminal compromise. She had some slight compression at C4-5. Underwent anterior cervical decompression C4-5, 5-6. Arthrodesis C4-5,5-6 10/10/2015 per Dr. Mikal Plane. Postoperatively lower extremity weakness. She returned to the OR for emergent neck exploration. No hematoma identified to the wound and hardware in good position. Decadron protocol as indicated.Hospital course pain management. Physical therapy evaluation completed 10/11/2015 with recommendations of physical medicine rehabilitation consult.  Patient was admitted for comprehensive rehabilitation program.  During today's visit, Jasmine [name] was accompanied by her husband who assisted with the history. She denied experiencing any cognitive changes for any reason. She has noted an increase in blurred vision and fatigue that she thinks may be secondary to certain medications.   From an emotional standpoint, Jasmine [name] described her current mood as "a little depressed" in reaction to her present medical situation. She said she never pictured living this way and she mentioned that she would "crash" emotionally if she did not get better. She is highly motivated and hopeful that she will improve and her husband is very supportive. She has never been diagnosed or treated for a mental health  disorder. She has never experienced any previous prolonged periods of depression or anxiety. Most of her depression is secondary to her loss in physical functioning and independence. She said she would be satisfied if she at least got better enough to take care of herself. She has no illusions that she will return to baseline.  She also has the schema that if she does not recover that she cannot enjoy life. I challenged her multiple times on this thought process. Fortunately, she does not feel that she will ever get to the point when she might be suicidal, but she said if she does not get batter to some degree that she will likely be severely depressed. Suicidal/homicidal ideation, plan or intent was denied. No manic or hypomanic episodes were reported. The patient denied ever experiencing any auditory/visual hallucinations. No major behavioral or personality changes were endorsed.   Patient feels that progress is being made in therapy. No barriers to therapy identified. She described the rehab staff as "very good." Her husband has been very supportive and he plans to do whatever is necessary to make sure someone is with her at home after she discharges.   PROCEDURES: [2 units 96116] Diagnostic clinical interview  Review of available records Mental Status Exam  MENTAL STATUS EXAM: APPEARANCE:  Wheelchair bound/spastic-type movements GEN:  Alert and oriented MOOD:  Depressed       AFFECT:  Mildly blunted  SPEECH:  Clear/Normal  THOUGHT CONTENT:  Appropriate HALLUCINATIONS:  None INTELLIGENCE:  Average  INSIGHT:  Good JUDGMENT:  Good   IMPRESSION: Overall, Jasmine [name] denied experiencing any cognitive difficulties and no overt issues were observed. Emotionally, she is suffering at least a mild degree of depression in light of her present medical circumstances, which I think is understandable. Most of  her distress involves negative thoughts about the future and the fear that she will no longer  be able to enjoy life given her physical disabilities. However, she remains hopeful that she will improve but is also already anticipating being severely depressed if this does not occur. I spent most of the time normalizing her feelings and I provided alternative ways of conceptualizing the situation and asked that she spent additional time reanalyzing her view of the future. I also discussed the benefit of involvement in support group (upon discharge) for chronic medical patients. No medications warranted for mood. I plan to follow-up with the patient throughout her admission for assistance with coping to provide support.   DIAGNOSIS:  Adjustment disorder with depressed mood    Debbe MountsAdam T. Laiya Wisby, Psy.D., ABN  Board-certified Clinical Neuropsychologist

## 2015-10-29 NOTE — Patient Care Conference (Signed)
Inpatient RehabilitationTeam Conference and Plan of Care Update Date: 10/29/2015   Time: 2:15 PM    Patient Name: Jasmine [name]      Medical Record Number: [id number]  Date of Birth: 09/16/1975 Sex: Female         Room/Bed: 4W25C/4W25C-01 Payor Info: Payor: BLUE CROSS BLUE SHIELD / Plan: BCBS OTHER / Product Type: *No Product type* /    Admitting Diagnosis: CERVICAL STENOSIS WITH MYELOPATHY  Admit Date/Time:  10/18/2015  6:46 PM Admission Comments: No comment available   Primary Diagnosis:  Spinal cord injury at C5-C7 level without injury of spinal bone (HCC) Principal Problem: Spinal cord injury at C5-C7 level without injury of spinal bone [facility])  Patient Active Problem List   Diagnosis Date Noted  . Adjustment disorder with depressed mood   . Neurogenic bowel 10/21/2015  . Neurogenic bladder 10/21/2015  . 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 Elective surgery   . Postoperative pain   . Respiratory depression   . Leukocytosis   . Generalized OA   . Allodynia   . Spinal cord injury at C5-C7 level without injury of spinal bone (HCC)   . HNP (herniated nucleus pulposus), cervical 10/10/2015  . Fusion of spine of cervical region 10/10/2015  . Acute paraplegia (HCC) 10/10/2015  . Neural foraminal stenosis of cervical spine 09/19/2015  . Cervical radiculopathy due to degenerative joint disease of spine 09/17/2015  . Left shoulder pain 09/16/2015  . Neck pain 09/16/2015  . Right hand weakness 08/06/2015  . Right shoulder pain 04/12/2015  . Insomnia 04/12/2015  . Adjustment disorder with mixed anxiety and depressed mood 03/13/2013  . Chronic lumbar radiculopathy 06/16/2011    Expected Discharge Date: Expected Discharge Date: 11/15/15  Team Members Present: Physician leading conference: Dr. Faith Rogue Social Worker Present: Amada Jupiter, LCSW Nurse Present: Carmie End, RN PT Present: Karolee Stamps, PT OT Present: Johnsie Cancel, OT SLP Present: Feliberto Gottron, SLP PPS  Coordinator present : Tora Duck, RN, CRRN     Current Status/Progress Goal Weekly Team Focus  Medical   gradual motor improvement, but slow. pt motivated still. working on bowel program. foley still in  improved trunk control/transfer ability  spasticity mgt, b/b mgt, pain control   Bowel/Bladder   6P bowel program. No incontinent episode report. LBM 10/28/15. Foley w/amber color urine  Managed bowel and bladder program  Continue to monitor effectiveness of bowel program. Discuss removal of foley   Swallow/Nutrition/ Hydration             ADL's   Mod- max LB dressing; set-up/ supervision UB bathing/dressing and grooming; +2 sliding board transfers  Min A overall  ADL re-training; functional transfers; sitting balance.    Mobility   max assist bed mobility; max +2 for slideboard transfers; standing with lift equipment, min assist w/c mobility; supervision to max assist for dynamic sitting balance  min assist w/c level transfers; therapeutic ambulation; mod I w/c mobility  neuro re-ed, transfers, balance training, w/c mobility, bed mobility, education, endurance, strengthening   Communication             Safety/Cognition/ Behavioral Observations            Pain   Vicodin 1-2 tabs q 4hrs, Robaxin  q 6hrs, pt requests very infrequently at this time  <3  Monitor for nonverbal cues of pain   Skin   Anterior neck incision, healed, OTA, unremarkable  No additional skin breakdown   Encourage turn q 2hrs    Rehab Goals  Patient on target to meet rehab goals: Yes *See Care Plan and progress notes for long and short-term goals.  Barriers to Discharge: pt/husband expectations, persistent dense left sided neuro deficits    Possible Resolutions to Barriers:  ongoing education, continued therapy to improve trunk and strength in left arm/leg.     Discharge Planning/Teaching Needs:  Home with family.  Husband and son planning to adjust work shifts in order to provide 24/7 care  ongoing    Team Discussion:  Slow neuro recovery;  Decreased spasticity;  Still anxious overall but beginning to face the reality that she is not expected to be ambulating at end of CIR stay.  Discussion about timing of trial foley removal;  Clarifying bowel program.  Need to discuss further with pt and spouse about including daughter in foley vs I/O care.  May consider toileting in the bed pan.  Currently still +2 with transfers but making progress.  Revisions to Treatment Plan:  None   Continued Need for Acute Rehabilitation Level of Care: The patient requires daily medical management by a physician with specialized training in physical medicine and rehabilitation for the following conditions: Daily direction of a multidisciplinary physical rehabilitation program to ensure safe treatment while eliciting the highest outcome that is of practical value to the patient.: Yes Daily medical management of patient stability for increased activity during participation in an intensive rehabilitation regime.: Yes Daily analysis of laboratory values and/or radiology reports with any subsequent need for medication adjustment of medical intervention for : Neurological problems;Post surgical problems;Urological problems  Jasmond River 10/29/2015, 4:01 PM

## 2015-10-29 NOTE — Progress Notes (Signed)
Physical Therapy Session Note  Patient Details  Name: Jasmine EddyBrenda Sadowsky MRN: [id number] Date of Birth: [birth year]  Today's Date: 10/29/2015 PT Individual Time: 1000-1055 PT Individual Time Calculation (min): 55 min   Short Term Goals: Week 2:  PT Short Term Goal 1 (Week 2): Pt will be able to perform propped on elbow <> sitting to progress bed mobility with mod assist PT Short Term Goal 2 (Week 2): Pt will be able to tolerate standing with lift equipment such as standing frame for postural control and weightbearing x 10 min PT Short Term Goal 3 (Week 2): Pt will be able to progress slideboard transfers with pt = 30% of transfer PT Short Term Goal 4 (Week 2): Pt will be able to initiate use of leg loops for functional management of BLE  Skilled Therapeutic Interventions/Progress Updates:   Session focused on functional slideboard transfer training with emphasis on pt completing more of transfer, w/c propulsion for mobility training and functional UE strengthening/coordination, and neuro re-ed to LLE in R sidelying using powder board for gravity eliminated muscle activation and motor control. Pt requires cues during transfers for head/hips relationship, anterior weigthshift, and hand placement. Pt required mod to max assist for transfer and +2 for set-up and stabilizing w/c. Pt able to activate knee extension and hip flexion in powder board activity. Repeated x 10 reps each at ankle, knee and hip. End of session transferred back to bed and positioned in supine.  Pt with questions about goal level and education provided to pt and husband on w/c level goals. She is disappointed and became tearful while both therapist and husband providing support. Pt is realistic but still remains hopeful for being able to walk at her daughter's graduation.   Therapy Documentation Precautions:  Precautions Precautions: Cervical Precaution Comments: orthostatic BPs Restrictions Weight Bearing Restrictions:  No General: PT Amount of Missed Time (min): 5 Minutes PT Missed Treatment Reason: Patient fatigue Pain:  Premedicated for nerve pain. Reports L shoulder pain bothering her.    See Function Navigator for Current Functional Status.   Therapy/Group: Individual Therapy  Karolee StampsGray, Kaenan Jake Darrol PokeBrescia  Aleksander Edmiston B. Lexis Potenza, PT, DPT  10/29/2015, 11:40 AM

## 2015-10-29 NOTE — Progress Notes (Signed)
Occupational Therapy Session Note  Patient Details  Name: Jasmine [name] MRN: [id number] Date of Birth: 09-01-1975  Today's Date: 10/29/2015 OT Individual Time: [id number] and 1303-1400 OT Individual Time Calculation (min): 60 min and 57 min   Short Term Goals: Week 2:  OT Short Term Goal 1 (Week 2): Pt will complete sliding board transfer with assist of 1 in order to decrease burden of care.  OT Short Term Goal 2 (Week 2): Pt will don pants with mod A from bed level OT Short Term Goal 3 (Week 2): Pt will manage LEs into circle sit position with max A in prep for dressing tasks.   Skilled Therapeutic Interventions/Progress Updates:    Session One: Pt seen for OT session focusing on functional sitting balance, lateral leans, and fine motor coordination. Pt in supine upon arrival with husband present. She had already completed grooming tasks, TED hose and shoes donned and ready for tx session. Transfers throughout session completed via sliding board with max A with +2 to steady equipment.  Seated on edge of therapy mat in gym, pt completed lateral leans onto physioball with guarding assist. She was able to lean to side and return to midline with increased time and effort. Occasional posterior LOB episodes which she was able to self correct. Completed x2 sets of 10 reps to each side with extended rest break provided btwn set. Completed fine motor task during rest break. Seated on EOM, had pt practice picking up large clothes pins using L UE. Pt became frustrated and tearful with difficulty of task- emotional support provided. She was able to pick up clothes pins and place into plastic bin with increased time and trials. Pt returned to room at end of session, left set-up with meal tray and all needs in reach with husband present.  She voiced slight discomfort in L scapula. Light massage provided for comfort. Will follow up with MD regarding what is permitted for massage and scapular mobilization as  this is area where pt's initial injury occurred.   Session Two: Pt seen for OT session focusing on functional transfers and sitting balance. Pt asleep in supine upon arrival, voicing increased fatigue, however, willing to attempt therapy. She transferred to EOB with max A and +2 sliding board transfer completed to w/[name] with +2 required to stabilize equipment. She required assist for posterior scoot into chair. Plan to get pt into shower tomorrow. Session used to assess sitting positioning in roll in shower chair in prep for showering tasks. Maxisky used to transfer pt to roll in shower chair. PT able to maintain balance seated in slightly reclined shower chair. She voiced feeling comfortable/ confident with seating position.  Cleared with MD to perform scapular mobilization as pt with increased tightness/ discomfort in L scapular area. Pt's husband provided with demonstration and hands on instruction of how to perform scapular mobilization. Would cont to benefit from hands on experience. Pt returned to room at end of session, left set-up with all needs in reach.   Therapy Documentation Precautions:  Precautions Precautions: Cervical Precaution Comments: orthostatic BPs Restrictions Weight Bearing Restrictions: No Pain:   No/ denies pain  See Function Navigator for Current Functional Status.   Therapy/Group: Individual Therapy  Lewis, Jasmine [name] 10/29/2015, 7:09 AM

## 2015-10-29 NOTE — Progress Notes (Signed)
Orthopedic Tech Progress Note Patient Details:  Jasmine EddyBrenda [name] [birth year] [id number]  Patient ID: Jasmine [name], female   DOB: [birth year], 40 y.o.   MRN: [id number] Called in advanced brace order; spoke with Loreli SlotAntoinette  Rayquan Amrhein 10/29/2015, 2:35 PM

## 2015-10-29 NOTE — Progress Notes (Signed)
Physical Therapy Session Note  Patient Details  Name: Jasmine [name] MRN: [id number] Date of Birth: 01/21/1976  Today's Date: 10/29/2015 PT Individual Time: 1430-1531 PT Individual Time Calculation (min): 61 min   Short Term Goals: Week 2:  PT Short Term Goal 1 (Week 2): Pt will be able to perform propped on elbow <> sitting to progress bed mobility with mod assist PT Short Term Goal 2 (Week 2): Pt will be able to tolerate standing with lift equipment such as standing frame for postural control and weightbearing x 10 min PT Short Term Goal 3 (Week 2): Pt will be able to progress slideboard transfers with pt = 30% of transfer PT Short Term Goal 4 (Week 2): Pt will be able to initiate use of leg loops for functional management of BLE  Skilled Therapeutic Interventions/Progress Updates:    Pt reporting being exhausted today but eager for therapy. Pt's husband with questions about progression and education provided to both in regards to SCI recovery and progression of mobility/goals. Demonstrated a few core strengthening exercises pt and pt's husband could complete in room on downtime including PNF diagonals, rotation and reaching from w/c within safe range.   Pt performed max +2 transfer with slideboard on/off Nustep with cues for technique and use of step under feet for support. Nustep for reciprocal movement pattern retraining and neuro re-ed to BLE and BLE (LUE more so that R) x 30 min on level 2. Pt performed last minute on level 3. Plan to retry standing frame in future session (as discussed with patient) to see progress in the last week.   Therapy Documentation Precautions:  Precautions Precautions: Cervical Precaution Comments: orthostatic BPs Restrictions Weight Bearing Restrictions: No  Pain:  no complaints.      See Function Navigator for Current Functional Status.   Therapy/Group: Individual Therapy  Karolee StampsGray, [name]  Darryll Raju B. Adien Kimmel, PT, DPT  10/29/2015, 3:33 PM

## 2015-10-30 ENCOUNTER — Inpatient Hospital Stay (HOSPITAL_COMMUNITY): Payer: BLUE CROSS/BLUE SHIELD | Admitting: Physical Therapy

## 2015-10-30 ENCOUNTER — Inpatient Hospital Stay (HOSPITAL_COMMUNITY): Payer: BLUE CROSS/BLUE SHIELD | Admitting: Occupational Therapy

## 2015-10-30 MED ORDER — DEXAMETHASONE 4 MG PO TABS
4.0000 mg | ORAL_TABLET | Freq: Two times a day (BID) | ORAL | Status: DC
  Administered 2015-10-30 – 2015-11-04 (×10): 4 mg via ORAL
  Filled 2015-10-30 (×10): qty 1

## 2015-10-30 MED ORDER — SENNOSIDES-DOCUSATE SODIUM 8.6-50 MG PO TABS
2.0000 | ORAL_TABLET | Freq: Every day | ORAL | Status: DC
  Administered 2015-10-31 – 2015-11-09 (×10): 2 via ORAL
  Filled 2015-10-30 (×10): qty 2

## 2015-10-30 MED ORDER — NORTRIPTYLINE HCL 10 MG PO CAPS
20.0000 mg | ORAL_CAPSULE | Freq: Every day | ORAL | Status: DC
  Administered 2015-10-30 – 2015-11-14 (×16): 20 mg via ORAL
  Filled 2015-10-30 (×16): qty 2

## 2015-10-30 MED ORDER — BISACODYL 10 MG RE SUPP
10.0000 mg | Freq: Every day | RECTAL | Status: DC
  Administered 2015-10-30 – 2015-11-14 (×16): 10 mg via RECTAL
  Filled 2015-10-30 (×16): qty 1

## 2015-10-30 NOTE — Progress Notes (Signed)
Social Work Patient ID: Jasmine [name], female   DOB: [birth year], 40 y.o.   MRN: [id number]   Met with pt and spouse this afternoon to review team conference.  Both aware that team continues to target 6/9 for d/c.  Pt states that she wants "to stay longer if I need it... This is where I'm going to make the most progress.." .  She remains very motivated but does report that yesterday's discussion with staff about reality of not walking at d/c was upsetting.  "I had a bad day but I'm going to keep pushing."  Reports that foley was removed today and she is hopeful that she will regain bladder sensation and control.  Discussed need for I/O caths at d/c.  Pt reports that her spouse will be trained but she does not want her children performing this.  Husband has changed his work to weekends only and notes he is only 3 miles from the home and could come home to cath when needed.  Will continue to follow.  Shawntavia Saunders, LCSW

## 2015-10-30 NOTE — Progress Notes (Signed)
Patient unable to void and reported no sensation to urinate. In and out cathed patient after 8 hours of  removing foley for 450cc dark yellow urine. Patient requested bowel program early due to visitors. Patient reports sensation of insertion of suppository. Patient required dig stim with minimal hard bms noted. Patient required disimpaction of moderate formed stool. Continue with plan of care.   Cleotilde NeerJoyce, Elysa Womac S

## 2015-10-30 NOTE — Progress Notes (Signed)
Discussed with patient and spouse importance of his role in bowel program and I&O cath process as d/c date gets closer.  Both patient and spouse want to begin early next week with education, hopeful that some function for patient has returned. RN answered questions and provided education.  Will continue to monitor.

## 2015-10-30 NOTE — Progress Notes (Signed)
Physical Therapy Session Note  Patient Details  Name: Jasmine [name] MRN: [id number] Date of Birth: Oct 08, 1975  Today's Date: 10/30/2015 PT Individual Time: 1105-1200 and 1310-1340 PT Individual Time Calculation (min): 55 min and 30 min  Week 2:  PT Short Term Goal 1 (Week 2): Pt will be able to perform propped on elbow <> sitting to progress bed mobility with mod assist PT Short Term Goal 2 (Week 2): Pt will be able to tolerate standing with lift equipment such as standing frame for postural control and weightbearing x 10 min PT Short Term Goal 3 (Week 2): Pt will be able to progress slideboard transfers with pt = 30% of transfer PT Short Term Goal 4 (Week 2): Pt will be able to initiate use of leg loops for functional management of BLE  Skilled Therapeutic Interventions/Progress Updates:   Pt received in bed resting with husband present.  Pt able to take a nap between therapies but still fatigued.  Assisted pt with donning of leg loops.  Continued bed mobility training with leg loops with pt requiring total verbal and visual cues to sequence use of leg loops for rolling to L side and for side > sit with mod A overall.  Performed slideboard transfer bed > w/c with leg loops and +2 for safety but pt performing 30-35% of transfer pushing and scooting with bilat UE.  Pt performed w/c mobility training x 150' with focus on full UE ROM during propulsion for more efficient propulsion sequence for energy and joint conservation.  Pt reporting an episode this am of flexing her neck too much and experiencing numbness down her body.  Will discuss with primary care team about possibility of soft neck collar for support and reminder of ROM precautions.  Pt set up in standing frame with block under feet and transitioned sit > stand with total A.  Pt able to maintain standing x 20 minutes while performing lateral weight shifting, activation of LE, trunk/postural control training during dynamic UE movements:  alternating UE flexion, scap retraction rows, shoulder ER, isometric shoulder and elbow extension and passive anterior shoulder stretches.  Pt tolerated well with one repositioning needed due to sciatic pain in LLE.  Returned to w/c and to room for pt to eat lunch.  Left with husband present and all items within reach.  PM session: Pt and husband concerned because pt reporting increased pain in back of neck and upper back on L side with w/c propulsion.  Discussed with pt and husband different types of pain nerve vs. Muscular and the effects of weakness, muscle tightness and biomechanics of her LUE on pain as well as prevention of overuse injury.  Will continue to promote proper joint alignment and biomechanics with transfers and w/c mobility.  Discussed with OT use of kinesiotape over L upper trap for pain management and tone reduction.  Pt transported to car simulator with total A in w/c due to pain in L upper back.  Husband reports car height at 24".  Pt set up with slideboard and block under feet and performed slideboard into car with use of leg loops and max A of one person but with total verbal cues for sequence and set up.  Block under feet too tall and causing pt's knees to be placed above hip level.  Performed slideboard back to w/c with feet on floor but pt required total A to perform safely due to hips and slideboard moving without feet supported.  Husband states he will be building  a block to put under pt's feet for transfers at home.  Pt returned to room and left with all items within reach to await OT.  Therapy Documentation Precautions:  Precautions Precautions: Cervical Precaution Comments: orthostatic BPs Restrictions Weight Bearing Restrictions: No Other Treatments: Treatments Neuromuscular Facilitation: Right;Left;Upper Extremity;Lower Extremity;Activity to increase motor control;Activity to increase timing and sequencing;Activity to increase sustained activation;Activity to increase  lateral weight shifting;Activity to increase anterior-posterior weight shifting   See Function Navigator for Current Functional Status.   Therapy/Group: Individual Therapy  Edman CircleHall, Javonna Balli Faucette 10/30/2015, 12:30 PM

## 2015-10-30 NOTE — Progress Notes (Signed)
Occupational Therapy Session Note  Patient Details  Name: Jasmine EddyBrenda Cerrone MRN: [id number] Date of Birth: [birth year]  Today's Date: 10/30/2015 OT Individual Time: [id number] and 1345- 1430 OT Individual Time Calculation (min): 75 min and 45 min   Short Term Goals: Week 2:  OT Short Term Goal 1 (Week 2): Pt will complete sliding board transfer with assist of 1 in order to decrease burden of care.  OT Short Term Goal 2 (Week 2): Pt will don pants with mod A from bed level OT Short Term Goal 3 (Week 2): Pt will manage LEs into circle sit position with max A in prep for dressing tasks.   Skilled Therapeutic Interventions/Progress Updates:    Session One: Pt seen for OT ADL bathing/dressing session. Pt's husband present for therapy session and actively participated with assisting and asking questions throughout. Pt in supine upon arrival, ready for showering task. She rolled with min A for management of LEs. Maxisky pad placed and pt transferred via lift to roll in shower chair. QRB placed around for safety. She bathed in shower with assist for LEs. She was able to grossly grasp washcloth with L UE to wash R arm.  She returned to bed via lift for dressing  Tasks. Catheter removed by nursing per order. Spent extensive time educating regarding bowel/ bladder program, in/out caths, what toileting tasks involve (clothing management, hygiene, transfers, etc), and family training. Will cont to educate and make plans for d/c.  Pt left in supine at end of session, all needs in reach with husband present.   Session Two: Pt seen for OT session focusing on neuro re-ed. Pt sitting up in w/c upon arrival, agreeable to tx session. She was taken to therapy day room for change of scenery. Attempted E-stim for L wrist extension. However, pt had just donned lotion and was unable to get electrodes to fully stick. Pt able to feel electrodes initially before electrodes fell off. Will attempt again tomorrow. Completed fine  motor task of picking up grapes. She was able to use pincer grasp with L hand to pick up small grapes. Then completed task using crackers, picking up and flipping crackers over, able to complete with increased time. Pt voiced increased pain/ tension in traps. Light massage provided and kinesiotape donned. Made PA aware of pt's new neck discomfort.  Pt's husband returned her to room at end of session.   Therapy Documentation Precautions:  Precautions Precautions: Cervical Precaution Comments: orthostatic BPs Restrictions Weight Bearing Restrictions: No  See Function Navigator for Current Functional Status.   Therapy/Group: Individual Therapy  Lewis, Ronel Rodeheaver C 10/30/2015, 6:46 AM

## 2015-10-30 NOTE — Progress Notes (Signed)
[facility] PHYSICAL MEDICINE & REHABILITATION     PROGRESS NOTE    Subjective/Complaints: Doing fairly well. Sleep is still an issue. Has anxiety at night and experiences "tingling" which keeps her awake..  ROS: Pt denies fever, rash/itching, headache, blurred or double vision, nausea, vomiting, abdominal pain, diarrhea, chest pain, shortness of breath, palpitations, dysuria, dizziness,  bleeding, or depression   Objective: Vital Signs: Blood pressure 125/77, pulse 71, temperature 97.8 F (36.6 C), temperature source Oral, resp. rate 16, weight 67.767 kg (149 lb 6.4 oz), last menstrual period 09/19/2015, SpO2 97 %. No results found. No results for input(s): WBC, HGB, HCT, PLT in the last 72 hours. No results for input(s): NA, K, CL, GLUCOSE, BUN, CREATININE, CALCIUM in the last 72 hours.  Invalid input(s): CO CBG (last 3)  No results for input(s): GLUCAP in the last 72 hours.  Wt Readings from Last 3 Encounters:  10/30/15 67.767 kg (149 lb 6.4 oz)  10/11/15 68.6 kg (151 lb 3.8 oz)  09/18/15 64.411 kg (142 lb)    Physical Exam:  Constitutional: She is oriented to person, place, and time. She appears well-developed and well-nourished.  HENT: dentition normal, mucosa pink and moist Head: Normocephalic.  Right Ear: External ear normal.  Left Ear: External ear normal.  Eyes: Conjunctivae and EOM are normal.  Neck: Normal range of motion. Neck supple. No thyromegaly present.  Cardiovascular: Normal rate and regular rhythm.  Respiratory: Effort normal and breath sounds normal. No respiratory distress.  GI: Soft. Bowel sounds are normal. She exhibits no distension.  Musculoskeletal: She exhibits edema and tenderness.--tolerated manipulation of thighs better today  Neurological: She is alert and oriented to person, place, and time.  DTRs symmetric in bilateral upper extremities, 3+ in lowers etensor tone 3/4 bilateral lower exts, left leg tender Sensation decreased wrists  bilaterally left more than right. Motor: Right upper extremity: delt 5/5, bicep 5/5, tricep 4/5, wrist 4/5, HI 3.  Left upper extremity: Shoulder abduction, elbow flexion 4+ to 5/5, elbow extension 1/5, wrist extension 2/5, , finger grip 1/5 Left lower extremity---occasionally trace APF/ADF. Saw trace KE/KF today also. Improved sitting balance-  Right lower extremity: HF and KE  2- to 2+/5, ADF/PF  1 to 1+/5---NO SUBST CHANGE Skin:  Neck incision clean and dry   Psychiatric: Her speech is normal. Thought content normal. Her mood is appropriate  Assessment/Plan: 1. 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 Physiatrist is providing close team supervision and 24 hour management of active medical problems listed below. Physiatrist and rehab team continue to assess barriers to discharge/monitor patient progress toward functional and medical goals.  Function:  Bathing Bathing position Bathing activity did not occur: Refused Position: Systems developer parts bathed by patient: Right arm, Left arm, Chest, Abdomen, Front perineal area, Right upper leg, Left upper leg Body parts bathed by helper: Buttocks, Right lower leg, Left lower leg, Back  Bathing assist Assist Level: Touching or steadying assistance(Pt > 75%)      Upper Body Dressing/Undressing Upper body dressing   What is the patient wearing?: Pull over shirt/dress     Pull over shirt/dress - Perfomed by patient: Thread/unthread right sleeve, Thread/unthread left sleeve Pull over shirt/dress - Perfomed by helper: Put head through opening, Pull shirt over trunk        Upper body assist Assist Level: Supervision or verbal cues      Lower  Body Dressing/Undressing Lower body dressing   What is the patient wearing?: Ted Hose, Shoes, Pants     Pants- Performed by patient: Thread/unthread right pants  leg, Thread/unthread left pants leg Pants- Performed by helper: Thread/unthread right pants leg, Thread/unthread left pants leg, Pull pants up/down   Non-skid slipper socks- Performed by helper: Don/doff right sock, Don/doff left sock       Shoes - Performed by helper: Don/doff right shoe, Don/doff left shoe, Fasten right, Fasten left       TED Hose - Performed by helper: Don/doff right TED hose, Don/doff left TED hose  Lower body assist Assist for lower body dressing: 2 Designer, multimediaHelpers      Toileting Toileting Toileting activity did not occur: No continent bowel/bladder event        Toileting assist Assist level: Two helpers   Transfers Chair/bed transfer Chair/bed transfer activity did not occur: Safety/medical concerns Chair/bed transfer method: Lateral scoot Chair/bed transfer assist level: 2 helpers Chair/bed transfer assistive device: Sliding board, Armrests     Locomotion Ambulation Ambulation activity did not occur: Safety/medical Investment banker, operationalconcerns         Wheelchair Wheelchair activity did not occur: Safety/medical concerns Type: Manual Max wheelchair distance: 175' Assist Level: Touching or steadying assistance (Pt > 75%)  Cognition Comprehension Comprehension assist level: Follows complex conversation/direction with no assist  Expression Expression assist level: Expresses complex ideas: With no assist  Social Interaction Social Interaction assist level: Interacts appropriately with others - No medications needed.  Problem Solving Problem solving assist level: Solves complex problems: Recognizes & self-corrects  Memory Memory assist level: Complete Independence: No helper   Medical Problem List and Plan: 1. 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 Status post ACDF C4-5, 5-6.   -continue therapies  -discussed expectations regarding recovery and prognosis 2. DVT  Prophylaxis/Anticoagulation: SCDs.    vascular study negative -initiated sq lovenox 30 q12---12 week duration 3. Pain Management: Baclofen 15mg  3 times a day. Valium qhs prn  -Lyrica 150 mg daily at bedtime and 75mg  qam  -Cymbalta 30 mg daily.   -Hydrocodone and Robaxin as needed.  -increase pamelor to 20mg  qhs   4. Mood: team to provide ego support,    -anxiety with some improvement. Has big expectations of herself/recovery  -neuropsych consult at some point 5. Neuropsych: This patient is capable of making decisions on her own behalf. 6. Skin/Wound Care: Routine skin checks  -decubitus precautions/frequent turning 7. Fluids/Electrolytes/Nutrition:  8. Neurogenic bowel and bladder.    -remove foley tomorrow morning---discussed with patient  -AM soft/lax with PM suppository   -encouraged husband to take part in care 9. Leukocytosis due to steroids.  -decrease steroids to q12    LOS (Days) 12 A FACE TO FACE EVALUATION WAS PERFORMED  SWARTZ,ZACHARY T 10/30/2015 12:45 PM

## 2015-10-30 NOTE — Progress Notes (Signed)
Physical Therapy Session Note  Patient Details  Name: Jasmine EddyBrenda Shappell MRN: [id number] Date of Birth: June 03, 1976  Today's Date: 10/30/2015 PT Individual Time: 1500-1525 PT Individual Time Calculation (min): 25 min   Short Term Goals: Week 2:  PT Short Term Goal 1 (Week 2): Pt will be able to perform propped on elbow <> sitting to progress bed mobility with mod assist PT Short Term Goal 2 (Week 2): Pt will be able to tolerate standing with lift equipment such as standing frame for postural control and weightbearing x 10 min PT Short Term Goal 3 (Week 2): Pt will be able to progress slideboard transfers with pt = 30% of transfer PT Short Term Goal 4 (Week 2): Pt will be able to initiate use of leg loops for functional management of BLE  Skilled Therapeutic Interventions/Progress Updates:    Pt received seated in w/c with husband propelling pt to gym, denies pain "none more than normal" and agreeable to treatment. Pt requests to focus on fine motor control LUE. Performed LUE reaching for horseshoe with dynamic balance and anterior weight shift, with focus on core stability without UE support. Then throwing horseshoe with RUE for internal perturbation and anticipatory postural adjustments. Checkers game using colored blocks with loop on velcro pad; requires several attempts to accurately place block where intended, with decreased accuracy at longer range due to long lever arm and decreased stability at proximal musculature. Remained seated in w/c with husband present at completion of session.   Therapy Documentation Precautions:  Precautions Precautions: Cervical Precaution Comments: orthostatic BPs Restrictions Weight Bearing Restrictions: No   See Function Navigator for Current Functional Status.   Therapy/Group: Individual Therapy  Vista Lawmanlizabeth J Tygielski 10/30/2015, 3:37 PM

## 2015-10-30 NOTE — Plan of Care (Signed)
Problem: SCI BLADDER ELIMINATION Goal: RH STG MANAGE BLADDER WITH ASSISTANCE STG Manage Bladder With mod Assistance  Outcome: Not Progressing Requires total assist in and out caths every 8 hours Goal: RH STG MANAGE BLADDER WITH EQUIPMENT WITH ASSISTANCE STG Manage Bladder With Equipment With Mod Assistance  Outcome: Not Progressing Requires total assist at this time

## 2015-10-31 ENCOUNTER — Inpatient Hospital Stay (HOSPITAL_COMMUNITY): Payer: BLUE CROSS/BLUE SHIELD | Admitting: Occupational Therapy

## 2015-10-31 ENCOUNTER — Inpatient Hospital Stay (HOSPITAL_COMMUNITY): Payer: BLUE CROSS/BLUE SHIELD | Admitting: Physical Therapy

## 2015-10-31 MED ORDER — PHENAZOPYRIDINE HCL 100 MG PO TABS
100.0000 mg | ORAL_TABLET | Freq: Three times a day (TID) | ORAL | Status: DC
  Administered 2015-10-31 – 2015-11-11 (×31): 100 mg via ORAL
  Filled 2015-10-31 (×34): qty 1

## 2015-10-31 NOTE — Progress Notes (Addendum)
Occupational Therapy Session Note  Patient Details  Name: Jasmine [name] MRN: [id number] Date of Birth: Nov 25, 1975  Today's Date: 10/31/2015 OT Individual Time: 4098-1191 and 1405-1500 OT Individual Time Calculation (min): 73 min and 55 min   Short Term Goals: Week 2:  OT Short Term Goal 1 (Week 2): Pt will complete sliding board transfer with assist of 1 in order to decrease burden of care.  OT Short Term Goal 2 (Week 2): Pt will don pants with mod A from bed level OT Short Term Goal 3 (Week 2): Pt will manage LEs into circle sit position with max A in prep for dressing tasks.   Skilled Therapeutic Interventions/Progress Updates:    Session One: Pt seen for OT tx session focusing on functional transfers and ADL re-training. Pt in supine upon arrival with husband present stretching pt's LEs. She declined bathing/dressing session this morning.  She completed sliding board transfer to drop arm BSC with +2 assist. Completed lateral leans onto bed and into w/c with steadying assist and second person to assist with clothing management. Pt able to assist with pulling up/down pants with R UE. Rest breaks required throughout. She displayed improved static and dynamic sitting balance on BSC compared to previous sessions and voiced feeling more comfortable seated on BSC. Educated regarding clothing choices that may ease toileting task ( long gowns, etc). Cont to educated regarding bowel and bladder programs.  She transferred from [facility] > w/c with +2 assist. She completed grooming tasks at sink, assist required to manage water faucet, however, was able to manage toothpaste/ toothbrush independently.  She was taken to therapy gym total A. She completed fine motor task playing connect 4. Pt able to use pincer grasp to pick up playing pieces and place in slot- able to complete with increased time. She became less frustrated with this task today compared to previous sessions working on fine motor coordination.   Pt returned to room at end of session and max A +1 sliding board transfer completed back to bed with VCs for weight shift. Pt left in supine position in prep for in/out cath to be completed. RN made aware of pt's position.  Session Two: Pt seen for OT session focusing on neuro re- ed and fine motor coordination. Pt missed 5 min of OT session as nursing present upon arrival completing in/out cath. OT assisted with pulling pants up and she transferred to EOB with max A.  Transfers completed throughout session with max A of one with +2 to assist with steadying equipment.  Pt sat on EOM throughout session, displaying much improved static/ dynamic sitting balance and unsupported sitting posture. Seated on EOM in therapy gym, pt completed 15 min of E-stim for L wrist extensor on level 15. Pt able to feel sensation of E-stim on dorsal aspect of arm. She completed fine motor task of picking up and placing small items. Initially had great difficulty, however, once Dycem placed on table, pt able to use tenodesis to assist with picking up and placing items. Pt noted to have radial deviation during wrist movement, and was unable to complete ulnar deviation.  She then completed task using R UE, required to pick up small animals and place animals on their feet with vision occluded working on in-hand sensation/ manipulation.     Pt returned to w/c at end of session and left for hand off to PT.   Therapy Documentation Precautions:  Precautions Precautions: Cervical Precaution Comments: orthostatic BPs Restrictions Weight Bearing Restrictions: No Pain:  No/ denies pain  See Function Navigator for Current Functional Status.   Therapy/Group: Individual Therapy  Lewis, Brooke Payes C 10/31/2015, 7:03 AM

## 2015-10-31 NOTE — Progress Notes (Signed)
Occupational Therapy Session Note  Patient Details  Name: Jasmine EddyBrenda Dekay MRN: [id number] Date of Birth: 21-Jan-1976  Today's Date: 10/31/2015 OT Individual Time: 1100-1200 OT Individual Time Calculation (min): 60 min    Short Term Goals: Week 2:  OT Short Term Goal 1 (Week 2): Pt will complete sliding board transfer with assist of 1 in order to decrease burden of care.  OT Short Term Goal 2 (Week 2): Pt will don pants with mod A from bed level OT Short Term Goal 3 (Week 2): Pt will manage LEs into circle sit position with max A in prep for dressing tasks.   Skilled Therapeutic Interventions/Progress Updates:    1:1 Neuro muscular reeducation with focus on distal left Ue with Bioness to facilitate a functional extension and grasp. After use of Bioness pt able to elicit some finger flexion and slight extension on a surface with wrist supported at neural.   Therapy Documentation Precautions:  Precautions Precautions: Cervical Precaution Comments: orthostatic BPs Restrictions Weight Bearing Restrictions: No Pain: Soreness but able to continue See Function Navigator for Current Functional Status.   Therapy/Group: Individual Therapy  Roney MansSmith, Marlin Brys [facility] 10/31/2015, 3:48 PM

## 2015-10-31 NOTE — Progress Notes (Signed)
Patient complained of feeling "pressure" prior to 1400 and required in and out cath for 750cc and complained of pressure at 1750. Patient in and out cathed for 400cc at 1800. Reviewed with patient to monitor volumes and feelings of pressure. Continue with plan of care .  Cleotilde NeerJoyce, Kinnick Maus S

## 2015-10-31 NOTE — Progress Notes (Signed)
[place] PHYSICAL MEDICINE & REHABILITATION     PROGRESS NOTE    Subjective/Complaints: Didn't sleep well because of late night cath---couldn't fall back to sleep or get comfortable. Has questions about cathing schedule.  ROS: Pt denies fever, rash/itching, headache, blurred or double vision, nausea, vomiting, abdominal pain, diarrhea, chest pain, shortness of breath, palpitations, dysuria, dizziness,  bleeding, or depression   Objective: Vital Signs: Blood pressure 121/71, pulse 69, temperature 98 F (36.7 C), temperature source Oral, resp. rate 16, weight 67.767 kg (149 lb 6.4 oz), last menstrual period 09/19/2015, SpO2 98 %. No results found. No results for input(s): WBC, HGB, HCT, PLT in the last 72 hours. No results for input(s): NA, K, CL, GLUCOSE, BUN, CREATININE, CALCIUM in the last 72 hours.  Invalid input(s): CO CBG (last 3)  No results for input(s): GLUCAP in the last 72 hours.  Wt Readings from Last 3 Encounters:  10/30/15 67.767 kg (149 lb 6.4 oz)  10/11/15 68.6 kg (151 lb 3.8 oz)  09/18/15 64.411 kg (142 lb)    Physical Exam:  Constitutional: She is oriented to person, place, and time. She appears well-developed and well-nourished.  HENT: dentition normal, mucosa pink and moist Head: Normocephalic.  Right Ear: External ear normal.  Left Ear: External ear normal.  Eyes: Conjunctivae and EOM are normal.  Neck: Normal range of motion. Neck supple. No thyromegaly present.  Cardiovascular: Normal rate and regular rhythm.  Respiratory: Effort normal and breath sounds normal. No respiratory distress.  GI: Soft. Bowel sounds are normal. She exhibits no distension.  Musculoskeletal: She exhibits edema and tenderness.--tolerated manipulation of thighs better today  Neurological: She is alert and oriented to person, place, and time.  DTRs symmetric in bilateral upper extremities, 3+ in lowers etensor tone 3/4 bilateral lower exts, left leg tender Sensation  decreased wrists bilaterally left more than right. Motor: Right upper extremity: delt 5/5, bicep 5/5, tricep 4/5, wrist 4/5, HI 3.  Left upper extremity: Shoulder abduction, elbow flexion 4+ to 5/5, elbow extension 1/5, wrist extension 2/5, , finger grip 1/5 Left lower extremity--- trace APF, 0 ADF. Saw trace to 1/5 KE/KF/HE today. Right lower extremity: HF and KE  2- to 2+/5, ADF/PF  1 to 1+/5---NO SUBST CHANGE Skin:  Neck incision clean and dry   Psychiatric: Her speech is normal. Thought content normal. Her mood is appropriate  Assessment/Plan: 1. 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 Physiatrist is providing close team supervision and 24 hour management of active medical problems listed below. Physiatrist and rehab team continue to assess barriers to discharge/monitor patient progress toward functional and medical goals.  Function:  Bathing Bathing position Bathing activity did not occur: Refused Position: Systems developer parts bathed by patient: Right arm, Left arm, Chest, Abdomen, Front perineal area, Right upper leg, Left upper leg Body parts bathed by helper: Buttocks, Right lower leg, Left lower leg, Back  Bathing assist Assist Level: Touching or steadying assistance(Pt > 75%)      Upper Body Dressing/Undressing Upper body dressing   What is the patient wearing?: Pull over shirt/dress     Pull over shirt/dress - Perfomed by patient: Thread/unthread right sleeve, Thread/unthread left sleeve Pull over shirt/dress - Perfomed by helper: Put head through opening, Pull shirt over trunk        Upper body assist Assist Level: Supervision or verbal cues      Lower  Body Dressing/Undressing Lower body dressing   What is the patient wearing?: Ted Hose, Shoes, Pants     Pants- Performed by patient: Thread/unthread right pants leg,  Thread/unthread left pants leg Pants- Performed by helper: Thread/unthread right pants leg, Thread/unthread left pants leg, Pull pants up/down   Non-skid slipper socks- Performed by helper: Don/doff right sock, Don/doff left sock       Shoes - Performed by helper: Don/doff right shoe, Don/doff left shoe, Fasten right, Fasten left       TED Hose - Performed by helper: Don/doff right TED hose, Don/doff left TED hose  Lower body assist Assist for lower body dressing: 2 Helpers      Financial traderToileting Toileting Toileting activity did not occur: No continent bowel/bladder event   Toileting steps completed by helper: Adjust clothing prior to toileting, Performs perineal hygiene, Adjust clothing after toileting    Toileting assist Assist level: Two helpers   Transfers Chair/bed transfer Chair/bed transfer activity did not occur: Safety/medical concerns Chair/bed transfer method: Lateral scoot Chair/bed transfer assist level: 2 helpers Chair/bed transfer assistive device: Sliding board, Armrests     Locomotion Ambulation Ambulation activity did not occur: Safety/medical Investment banker, operationalconcerns         Wheelchair Wheelchair activity did not occur: Safety/medical concerns Type: Manual Max wheelchair distance: 175' Assist Level: Touching or steadying assistance (Pt > 75%)  Cognition Comprehension Comprehension assist level: Follows complex conversation/direction with no assist  Expression Expression assist level: Expresses complex ideas: With no assist  Social Interaction Social Interaction assist level: Interacts appropriately with others - No medications needed.  Problem Solving Problem solving assist level: Solves complex problems: Recognizes & self-corrects  Memory Memory assist level: Complete Independence: No helper   Medical Problem List and Plan: 1. 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 Status post ACDF C4-5, 5-6.    -continue therapies  -discussed expectations regarding recovery and prognosis 2. DVT Prophylaxis/Anticoagulation: SCDs.    vascular study negative -initiated sq lovenox 30 q12---12 week duration post injury 3. Pain Management: Baclofen 15mg  3 times a day. Valium qhs prn  -Lyrica 150 mg daily at bedtime and 75mg  qam  -Cymbalta 30 mg daily.   -Hydrocodone and Robaxin as needed.  -increased pamelor to 20mg  qhs   4. Mood: team to provide ego support,    -anxiety with some improvement. Has big expectations of herself/recovery  -neuropsych consult at some point 5. Neuropsych: This patient is capable of making decisions on her own behalf. 6. Skin/Wound Care: Routine skin checks  -decubitus precautions/frequent turning 7. Fluids/Electrolytes/Nutrition:  8. Neurogenic bowel and bladder.    -foley out. Working on cathing schedule.  Encouraged increased fluid intake in am/less in pm to decrease overnight cath needs. She will work with RN to find an acceptable schedule.   -AM soft/lax with PM suppository   -husband involved with bowel/bladder education 9. Leukocytosis due to steroids.  -decreased steroids to 4mg  q12 ---decrease further Monday   LOS (Days) 13 A FACE TO FACE EVALUATION WAS PERFORMED  SWARTZ,ZACHARY T 10/31/2015 1:09 PM

## 2015-10-31 NOTE — Progress Notes (Signed)
Physical Therapy Session Note  Patient Details  Name: Jasmine [name] MRN: [id number] Date of Birth: [birth year]  Today's Date: 10/31/2015 PT Individual Time: 4782-95621505-1545 PT Individual Time Calculation (min): 40 min   Short Term Goals: Week 2:  PT Short Term Goal 1 (Week 2): Pt will be able to perform propped on elbow <> sitting to progress bed mobility with mod assist PT Short Term Goal 2 (Week 2): Pt will be able to tolerate standing with lift equipment such as standing frame for postural control and weightbearing x 10 min PT Short Term Goal 3 (Week 2): Pt will be able to progress slideboard transfers with pt = 30% of transfer PT Short Term Goal 4 (Week 2): Pt will be able to initiate use of leg loops for functional management of BLE  Skilled Therapeutic Interventions/Progress Updates:   Session focused on slide board transfers wheelchair <> NuStep with max A x 2 to NuStep and min A with +2 to stabilize slide board back to wheelchair with 4" step under BLE, wheelchair parts management with patient removing R arm rest and utilizing leg loops to lift RLE off ELR with assist to remove LLE from ELR, NuStep using BUE/BLE with LUE attachment at level 4 x 15 min for neuro re-ed, reciprocal movement pattern retraining, and forced use. Patient declined propelling wheelchair back to room due to fatigue, left sitting in wheelchair with husband present.   Therapy Documentation Precautions:  Precautions Precautions: Cervical Precaution Comments: orthostatic BPs Restrictions Weight Bearing Restrictions: No Pain: Pain Assessment Pain Assessment: 0-10 Pain Score: 3  Pain Type: Acute pain Pain Location: Rib cage Pain Orientation: Anterior;Mid Pain Descriptors / Indicators: Tightness Pain Onset: On-going Pain Intervention(s): increased activity   See Function Navigator for Current Functional Status.   Therapy/Group: Individual Therapy  Kerney ElbeVarner, Tarren Velardi A 10/31/2015, 4:53 PM

## 2015-10-31 NOTE — Progress Notes (Signed)
Patient and husband educated by RN about I&O cath schedule and bladder scanning process.  Patient verbalized "I will never be able to get sleep if I have to wake up at night to do this. I will be tired all the time now."  RN educated importance of emptying bladder and sticking to schedule.  Will continue to monitor bladder, schedule and patient as fit; and continue educating patient/spouse.

## 2015-11-01 ENCOUNTER — Inpatient Hospital Stay (HOSPITAL_COMMUNITY): Payer: BLUE CROSS/BLUE SHIELD | Admitting: Occupational Therapy

## 2015-11-01 ENCOUNTER — Inpatient Hospital Stay (HOSPITAL_COMMUNITY): Payer: BLUE CROSS/BLUE SHIELD

## 2015-11-01 DIAGNOSIS — N390 Urinary tract infection, site not specified: Secondary | ICD-10-CM | POA: Insufficient documentation

## 2015-11-01 LAB — URINALYSIS, ROUTINE W REFLEX MICROSCOPIC
Bilirubin Urine: NEGATIVE
Glucose, UA: NEGATIVE mg/dL
KETONES UR: NEGATIVE mg/dL
Leukocytes, UA: NEGATIVE
Nitrite: NEGATIVE
PROTEIN: NEGATIVE mg/dL
Specific Gravity, Urine: 1.026 (ref 1.005–1.030)
pH: 6.5 (ref 5.0–8.0)

## 2015-11-01 LAB — URINE MICROSCOPIC-ADD ON: WBC, UA: NONE SEEN WBC/hpf (ref 0–5)

## 2015-11-01 LAB — GLUCOSE, CAPILLARY: Glucose-Capillary: 122 mg/dL — ABNORMAL HIGH (ref 65–99)

## 2015-11-01 MED ORDER — NITROFURANTOIN MONOHYD MACRO 100 MG PO CAPS
100.0000 mg | ORAL_CAPSULE | Freq: Two times a day (BID) | ORAL | Status: DC
  Administered 2015-11-01 – 2015-11-02 (×4): 100 mg via ORAL
  Filled 2015-11-01 (×5): qty 1

## 2015-11-01 NOTE — Progress Notes (Signed)
Patient c/o pressure at approx 8:30pm. Bladder was scanned & only revealed 43ml. After bowel program completed & patient repositioned, patient c/o burning & pain to her bladder. Bladder was scanned again & volume was 102ml. Approximately 15 minutes after that, patient was crying & stated that her pain was a 9/10, went from her abdomen down to her legs  & never happened before. Asked for prn pain medication & Valium. Medications were given as ordered. Called Dan Angiulli PA & an order for Pyridium 100mg  TID, & UA, C&S was given. Urine specimen was taken & medication given when available from pharmacy. At that time, patient stated that pain had gotten a little better. Husband in room. Went back to check on patient & she was asleep.

## 2015-11-01 NOTE — Progress Notes (Signed)
[place] PHYSICAL MEDICINE & REHABILITATION     PROGRESS NOTE    Subjective/Complaints: Patient seen sitting up in bed this morning. She states she slept well overnight. She notes burning with urination.  ROS: + Burning with urination. Denies CP, SOB, nausea, vomiting, diarrhea.   Objective: Vital Signs: Blood pressure 123/78, pulse 77, temperature 98.1 F (36.7 C), temperature source Oral, resp. rate 18, weight 67.767 kg (149 lb 6.4 oz), last menstrual period 09/19/2015, SpO2 97 %. No results found. No results for input(s): WBC, HGB, HCT, PLT in the last 72 hours. No results for input(s): NA, K, CL, GLUCOSE, BUN, CREATININE, CALCIUM in the last 72 hours.  Invalid input(s): CO CBG (last 3)  No results for input(s): GLUCAP in the last 72 hours.  Wt Readings from Last 3 Encounters:  10/30/15 67.767 kg (149 lb 6.4 oz)  10/11/15 68.6 kg (151 lb 3.8 oz)  09/18/15 64.411 kg (142 lb)    Physical Exam:  Constitutional: She appears well-developed and well-nourished. NAD. Head: Normocephalic. Atraumatic  Eyes: Conjunctivae and EOM are normal.  Cardiovascular: Normal rate and regular rhythm.  Respiratory: Effort normal and breath sounds normal. No respiratory distress.  GI: Soft. Bowel sounds are normal. She exhibits no distension.  Musculoskeletal: She exhibits mild edema and tenderness.  Neurological: She is alert and oriented.  Sensation decreased wrists bilaterally left more than right. Motor: Right upper extremity: delt 5/5, bicep 5/5, tricep 4/5, wrist 4/5, HI 4/5.  Left upper extremity: Shoulder abduction, elbow flexion 4+ to 5/5, elbow extension 4-/5, wrist extension 3-/5, , finger grip 3-/5 Left lower extremity--- trace APF, 0 ADF.  Right lower extremity: HF and KE  2- to 2+/5, ADF/PF  2/5, wiggles toes Skin:  Neck incision clean and dry   Psychiatric: Her speech is normal. Thought content normal. Her mood is appropriate  Assessment/Plan: 1. Spastic tetraplegia  secondary to cervical spinal cord injury which require 3+ hours per day of interdisciplinary therapy in a comprehensive inpatient rehab setting. Physiatrist is providing close team supervision and 24 hour management of active medical problems listed below. Physiatrist and rehab team continue to assess barriers to discharge/monitor patient progress toward functional and medical goals.  Function:  Bathing Bathing position Bathing activity did not occur: Refused Position: Systems developer parts bathed by patient: Right arm, Left arm, Chest, Abdomen, Front perineal area, Right upper leg, Left upper leg Body parts bathed by helper: Buttocks, Right lower leg, Left lower leg, Back  Bathing assist Assist Level: Touching or steadying assistance(Pt > 75%)      Upper Body Dressing/Undressing Upper body dressing   What is the patient wearing?: Pull over shirt/dress     Pull over shirt/dress - Perfomed by patient: Thread/unthread right sleeve, Thread/unthread left sleeve Pull over shirt/dress - Perfomed by helper: Put head through opening, Pull shirt over trunk        Upper body assist Assist Level: Supervision or verbal cues      Lower Body Dressing/Undressing Lower body dressing   What is the patient wearing?: Ted Hose, Shoes, Pants     Pants- Performed by patient: Thread/unthread right pants leg, Thread/unthread left pants leg Pants- Performed by helper: Thread/unthread right pants leg, Thread/unthread left pants leg, Pull pants up/down   Non-skid slipper socks- Performed by helper: Don/doff right sock, Don/doff left sock       Shoes - Performed by helper: Don/doff right shoe, Don/doff left shoe, Fasten right, Fasten left  TED Hose - Performed by helper: Don/doff right TED hose, Don/doff left TED hose  Lower body assist Assist for lower body dressing: 2 Helpers      Toileting Toileting Toileting activity did not occur: No continent bowel/bladder event    Toileting steps completed by helper: Adjust clothing prior to toileting, Performs perineal hygiene, Adjust clothing after toileting    Toileting assist Assist level: Two helpers   Transfers Chair/bed transfer Chair/bed transfer activity did not occur: Safety/medical concerns Chair/bed transfer method: Lateral scoot Chair/bed transfer assist level: 2 helpers Chair/bed transfer assistive device: Sliding board, Armrests     Locomotion Ambulation Ambulation activity did not occur: Safety/medical concerns         Wheelchair Wheelchair activity did not occur: Safety/medical concerns Type: Manual Max wheelchair distance: 175' Assist Level: Touching or steadying assistance (Pt > 75%)  Cognition Comprehension Comprehension assist level: Follows complex conversation/direction with no assist  Expression Expression assist level: Expresses complex ideas: With no assist  Social Interaction Social Interaction assist level: Interacts appropriately with others - No medications needed.  Problem Solving Problem solving assist level: Solves complex problems: Recognizes & self-corrects  Memory Memory assist level: Complete Independence: No helper   Medical Problem List and Plan: 1. 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 Status post ACDF C4-5, 5-6.   -continue therapies  -Have discussed expectations regarding recovery and prognosis 2. DVT Prophylaxis/Anticoagulation: SCDs.    vascular study negative -initiated sq lovenox 30 q12---12 week duration post injury 3. Pain Management: Baclofen 15mg  3 times a day. Valium qhs prn  -Lyrica 150 mg daily at bedtime and 75mg  qam  -Cymbalta 30 mg daily.   -Hydrocodone and Robaxin as needed.  -increased pamelor to 20mg  qhs 4. Mood: team to provide ego support,    -anxiety with some improvement. Has big expectations of herself/recovery  -neuropsych consult at  some point 5. Neuropsych: This patient is capable of making decisions on her own behalf. 6. Skin/Wound Care: Routine skin checks  -decubitus precautions/frequent turning 7. Fluids/Electrolytes/Nutrition:  8. Neurogenic bowel and bladder.    -Have Encouraged increased fluid intake in am/less in pm to decrease overnight cath needs.   -AM soft/lax with PM suppository   -husband involved with bowel/bladder education 9. Leukocytosis due to steroids.  -decreased steroids to 4mg  q12 ---decrease further Monday  -Labs ordered for tomorrow 10. Burning with urination  UA on 5/25 with few bacteria, U culture pending  Empiric Macrobid started on 5/26   LOS (Days) 14 A FACE TO FACE EVALUATION WAS PERFORMED  Kahlen Morais Karis Jubanil Farah Lepak 11/01/2015 10:07 AM

## 2015-11-01 NOTE — Progress Notes (Signed)
Occupational Therapy Session Note  Patient Details  Name: Jasmine EddyBrenda Muscatello MRN: [id number] Date of Birth: 1976-04-15  Today's Date: 11/01/2015 OT Individual Time: 1102-1200 OT Individual Time Calculation (min): 58 min    Skilled Therapeutic Interventions/Progress Updates:    Utilized Bioness electrical stimulation for LUE digit flexors and extensors.  Pt tolerated flexors on level 8 and extensors on level 7.5 without any adverse reactions.  Encouraged pt to help actively with stimulation overall using Exercise Mode for 16 mins.  Utilized Grasp Mode also with pt reaching for bean bags positioned on their side by therapist.  Still with increased difficulty squeezing and holding the bean bag in order to place it in a container on the bedside table.  Pt tolerate 14 mins of stimulation with this task.  Returned to room at end of session.  Pt left setup for lunch with call button in reach.  She was able to donn wrist cock up splint on the left hand.    Therapy Documentation Precautions:  Precautions Precautions: Cervical Precaution Comments: orthostatic BPs Restrictions Weight Bearing Restrictions: No  Pain: Pain Assessment Pain Assessment: No/denies pain ADL: See Function Navigator for Current Functional Status.   Therapy/Group: Individual Therapy  Jeannie Mallinger 11/01/2015, 12:20 PM

## 2015-11-01 NOTE — Progress Notes (Signed)
Occupational Therapy Session Note  Patient Details  Name: Jasmine [name] MRN: [id number] Date of Birth: Oct 29, 1975  Today's Date: 11/01/2015 OT Individual Time: 1400-1500 OT Individual Time Calculation (min): 60 min    Skilled Therapeutic Interventions/Progress Updates:    1:1 Therapeutic activity with focus on slide board transfer to Nu step (per pt's request). Pt perform slide board transfer with max A with extra time.  Focus on forced functional use of Bilateral LEs with and without UE support on resistance on level 1. Minimal A for activation in left LE before follow through on pedal. Pt perform only bilateral LEs for 5 min and then transitioned to using UEs to assist self for 25 more mins.  Pt with max slide board transfer back into w/c and then into bed using floor step for a elevated surface for her feet to maintain contact with. Without leg loops pt required max A to return to supine in bed. Pt successfully able to maintain a forward and controlled weight shift during transfer to decr burden of care.   Therapy Documentation Precautions:  Precautions Precautions: Cervical Precaution Comments: orthostatic BPs Restrictions Weight Bearing Restrictions: No General:   Vital Signs:   Pain: Pain Assessment Pain Assessment: No/denies pain  See Function Navigator for Current Functional Status.   Therapy/Group: Individual Therapy  Roney MansSmith, Nyeemah [facility]ette [facility] 11/01/2015, 2:24 PM

## 2015-11-01 NOTE — Progress Notes (Signed)
Occupational Therapy Weekly Progress Note  Patient Details  Name: Jasmine [name] MRN: [id number] Date of Birth: [birth year]  Beginning of progress report period: Oct 25, 2015 End of progress report period: Nov 01, 2015  Today's Date: 11/01/2015 OT Individual Time: 1000-1100 OT Individual Time Calculation (min): 60 min    Patient has met 2 of 3 short term goals.  Pt able to complete sliding board transfers with mod- max A of 1, however, requires +2 assist to stabilize equipment on some transfers. Pt making slow but steady progress towards OT goals. Pt and husband remain very motivated for therapy and cont to work hard.   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  Patient progressing toward long term goals..  Continue plan of care.  OT Short Term Goals Week 2:  OT Short Term Goal 1 (Week 2): Pt will complete sliding board transfer with assist of 1 in order to decrease burden of care.  OT Short Term Goal 1 - Progress (Week 2): Partly met OT Short Term Goal 2 (Week 2): Pt will don pants with mod A from bed level OT Short Term Goal 2 - Progress (Week 2): Met OT Short Term Goal 3 (Week 2): Pt will manage LEs into circle sit position with max A in prep for dressing tasks.  OT Short Term Goal 3 - Progress (Week 2): Met Week 3:  OT Short Term Goal 1 (Week 3): Pt will complete toileting task with total A of 1 in order to decrease caregiver burden.  OT Short Term Goal 2 (Week 3): Pt will roll in bed with supervision using leg loops in prep for functional task OT Short Term Goal 3 (Week 3): Pt will sit EOB to complete grooming or UB bathing task with close supervision in order to increase functional sitting balance.   Skilled Therapeutic Interventions/Progress  Updates:    Pt seen for OT session focusing on functional sitting balance and lateral leans in prep for toileting task. Pt in supine upon arrival, agreeable to tx session. Shoes and leg loops donned total A. Pt able to manage B LEs off EOB with min A using leg loops, assist required to bring trunk upright and adjust hips. With min steadying assist and increased time pt able to brush teeth seated EOB working on dynamic sitting balance. She transferred into w/c via sliding board with max A +1.  In therapy gym, transferred to [facility]. Completed lateral leans to B sides. She was able to lower self down with close supervision leaning onto two pillows. She initially required heavy min- mod A to come upright, however, progressed to light min- close supervision throughout session.  Pt requested to return to chair to rest at end of session. Left sitting in w/c with hand off to next OT.   Therapy Documentation Precautions:  Precautions Precautions: Cervical Precaution Comments: orthostatic BPs Restrictions Weight Bearing Restrictions: No Pain: Pain Assessment Pain Assessment: 0-10 Pain Score: 0-No pain  See Function Navigator for Current Functional Status.   Therapy/Group: Individual Therapy  Lewis, Tashawn Greff C 11/01/2015, 7:11 AM

## 2015-11-01 NOTE — Progress Notes (Signed)
Physical Therapy Weekly Progress Note  Patient Details  Name: Jasmine [name] MRN: [id number] Date of Birth: August 11, 1975  Beginning of progress report period: Oct 26, 2015 End of progress report period: Nov 01, 2015  Today's Date: 11/01/2015 PT Individual Time: 0915-0945 PT Individual Time Calculation (min): 30 min  Pt reporting significant bladder pain and suspected UTI -had just received medication. Pt requested to wait until next session to get OOB to give medication more time to work. Focused on stretching and ROM to BLE for flexibility, tone management, and stretching in all planes of hip, knee, and ankle. Pt reported some relief by end of session.   Patient has met 4 of 4 short term goals.  Pt is making good progress but slow neurological progress. Pt is often limited by nerve pain and LUE/shoulder pain. Family education is ongoing with pt's husband present for majority of sessions. Will need to determine what kind of w/c will be most beneficial for patient at d/c over the next week (rental vs K5 speciality w/c).  Patient continues to demonstrate the following deficits: quadriparesis, decreased balance, impaired tone, decreased activity tolerance/endurance, decreased functional mobility, decreased postural control, and therefore will continue to benefit from skilled PT intervention to enhance overall performance with activity tolerance, balance, postural control, ability to compensate for deficits and functional use of  right upper extremity, right lower extremity, left upper extremity and left lower extremity.  Patient progressing toward long term goals..  Continue plan of care.  PT Short Term Goals Week 2:  PT Short Term Goal 1 (Week 2): Pt will be able to perform propped on elbow <> sitting to progress bed mobility with mod assist PT Short Term Goal 1 - Progress (Week 2): Met PT Short Term Goal 2 (Week 2): Pt will be able to tolerate standing with lift equipment such as standing frame  for postural control and weightbearing x 10 min PT Short Term Goal 2 - Progress (Week 2): Met PT Short Term Goal 3 (Week 2): Pt will be able to progress slideboard transfers with pt = 30% of transfer PT Short Term Goal 3 - Progress (Week 2): Met PT Short Term Goal 4 (Week 2): Pt will be able to initiate use of leg loops for functional management of BLE PT Short Term Goal 4 - Progress (Week 2): Met Week 3:  PT Short Term Goal 1 (Week 3): Pt wlll be able to progess transfers to overall mod assist using slideboard on level surfaces PT Short Term Goal 2 (Week 3): Pt will be able to perform car transfer with max assist of 1 PT Short Term Goal 3 (Week 3): Pt's husband with initiate hands-on transfer training  Skilled Therapeutic Interventions/Progress Updates:  Ambulation/gait training;Balance/vestibular training;Community reintegration;Discharge planning;Functional mobility training;Functional electrical stimulation;DME/adaptive equipment instruction;Neuromuscular re-education;Pain management;Patient/family education;Splinting/orthotics;Psychosocial support;Stair training;Therapeutic Activities;Therapeutic Exercise;UE/LE Coordination activities;UE/LE Strength taining/ROM;Wheelchair propulsion/positioning;Disease management/prevention   Therapy Documentation Precautions:  Precautions Precautions: Cervical Precaution Comments: orthostatic BPs Restrictions Weight Bearing Restrictions: No   See Function Navigator for Current Functional Status.  Therapy/Group: Individual Therapy  Canary Brim Ivory Broad, PT, DPT  11/01/2015, 11:05 AM

## 2015-11-01 NOTE — Progress Notes (Signed)
Occupational Therapy Session Note  Patient Details  Name: Glendora Clouatre MRN: [id number] Date of Birth: [birth year]  Today's Date: 11/01/2015 OT Individual Time: 1710-1740 OT Individual Time Calculation (min): 30 min    Short Term Goals: Week 2:  OT Short Term Goal 1 (Week 2): Pt will complete sliding board transfer with assist of 1 in order to decrease burden of care.  OT Short Term Goal 1 - Progress (Week 2): Partly met OT Short Term Goal 2 (Week 2): Pt will don pants with mod A from bed level OT Short Term Goal 2 - Progress (Week 2): Met OT Short Term Goal 3 (Week 2): Pt will manage LEs into circle sit position with max A in prep for dressing tasks.  OT Short Term Goal 3 - Progress (Week 2): Met  Skilled Therapeutic Interventions/Progress Updates: Patient participated in skilled OT as follows:   Left UE desenitization ; L UE stereognosis activities to work on nerve/hand/brain feedback and  Eventual increased sensation and use of right hand;   Stretched patient bilateral lattisumus muscles  30 year old son in room during the session and relaxing on guest roll out bed   Therapy Documentation Precautions:  Precautions Precautions: Cervical Precaution Comments: orthostatic BPs Restrictions Weight Bearing Restrictions: No  Pain:denied   See Function Navigator for Current Functional Status.   Therapy/Group: Individual Therapy  Alfredia Ferguson [facility] 11/01/2015, 7:19 PM

## 2015-11-02 ENCOUNTER — Inpatient Hospital Stay (HOSPITAL_COMMUNITY): Payer: BLUE CROSS/BLUE SHIELD

## 2015-11-02 LAB — BASIC METABOLIC PANEL
ANION GAP: 13 (ref 5–15)
BUN: 18 mg/dL (ref 6–20)
CALCIUM: 9 mg/dL (ref 8.9–10.3)
CO2: 23 mmol/L (ref 22–32)
CREATININE: 0.52 mg/dL (ref 0.44–1.00)
Chloride: 98 mmol/L — ABNORMAL LOW (ref 101–111)
Glucose, Bld: 90 mg/dL (ref 65–99)
Potassium: 4.4 mmol/L (ref 3.5–5.1)
SODIUM: 134 mmol/L — AB (ref 135–145)

## 2015-11-02 LAB — CBC WITH DIFFERENTIAL/PLATELET
BASOS ABS: 0 10*3/uL (ref 0.0–0.1)
BASOS PCT: 0 %
EOS ABS: 0 10*3/uL (ref 0.0–0.7)
Eosinophils Relative: 0 %
HEMATOCRIT: 40.8 % (ref 36.0–46.0)
HEMOGLOBIN: 13 g/dL (ref 12.0–15.0)
Lymphocytes Relative: 17 %
Lymphs Abs: 2.1 10*3/uL (ref 0.7–4.0)
MCH: 27.4 pg (ref 26.0–34.0)
MCHC: 31.9 g/dL (ref 30.0–36.0)
MCV: 86.1 fL (ref 78.0–100.0)
Monocytes Absolute: 0.4 10*3/uL (ref 0.1–1.0)
Monocytes Relative: 3 %
NEUTROS ABS: 10 10*3/uL — AB (ref 1.7–7.7)
NEUTROS PCT: 80 %
Platelets: 355 10*3/uL (ref 150–400)
RBC: 4.74 MIL/uL (ref 3.87–5.11)
RDW: 13.9 % (ref 11.5–15.5)
WBC: 12.5 10*3/uL — AB (ref 4.0–10.5)

## 2015-11-02 LAB — URINE CULTURE: CULTURE: NO GROWTH

## 2015-11-02 MED ORDER — FLUCONAZOLE 100 MG PO TABS
100.0000 mg | ORAL_TABLET | Freq: Every day | ORAL | Status: DC
  Administered 2015-11-03 – 2015-11-13 (×11): 100 mg via ORAL
  Filled 2015-11-02 (×12): qty 1

## 2015-11-02 MED ORDER — FLUCONAZOLE 100 MG PO TABS
200.0000 mg | ORAL_TABLET | Freq: Once | ORAL | Status: AC
  Administered 2015-11-02: 200 mg via ORAL
  Filled 2015-11-02: qty 2

## 2015-11-02 NOTE — Progress Notes (Signed)
[facility] PHYSICAL MEDICINE & REHABILITATION     PROGRESS NOTE    Subjective/Complaints: Oral plaques improving No sweats or chills  ROS: + Burning with urination. Denies CP, SOB, nausea, vomiting, diarrhea.   Objective: Vital Signs: Blood pressure 125/82, pulse 86, temperature 97.6 F (36.4 C), temperature source Oral, resp. rate 18, weight 67.767 kg (149 lb 6.4 oz), last menstrual period 09/19/2015, SpO2 97 %. No results found.  Recent Labs  11/02/15 0622  WBC 12.5*  HGB 13.0  HCT 40.8  PLT 355    Recent Labs  11/02/15 0622  NA 134*  K 4.4  CL 98*  GLUCOSE 90  BUN 18  CREATININE 0.52  CALCIUM 9.0   CBG (last 3)   Recent Labs  11/01/15 1743  GLUCAP 122*    Wt Readings from Last 3 Encounters:  10/30/15 67.767 kg (149 lb 6.4 oz)  10/11/15 68.6 kg (151 lb 3.8 oz)  09/18/15 64.411 kg (142 lb)    Physical Exam:  Constitutional: She appears well-developed and well-nourished. NAD. Head: Normocephalic. Atraumatic  Eyes: Conjunctivae and EOM are normal.  Cardiovascular: Normal rate and regular rhythm.  Respiratory: Effort normal and breath sounds normal. No respiratory distress.  GI: Soft. Bowel sounds are normal. She exhibits no distension.  Musculoskeletal: She exhibits mild edema and tenderness.  Neurological: She is alert and oriented.  Sensation intact LT  BLE Motor: Right upper extremity: delt 5/5, bicep 5/5, tricep 4/5, wrist 4/5, HI 4/5.  Left upper extremity: Shoulder abduction, elbow flexion 4+ to 5/5, elbow extension 4-/5, wrist extension 3-/5, Triceps 2, finger grip 3-/5 Left lower extremity--- trace APF, 0 ADF.  Right lower extremity: HF and KE  2- to 2+/5, ADF/PF  2/5, wiggles toes Skin:  Neck incision clean and dry   Psychiatric: Her speech is normal. Thought content normal. Her mood is appropriate  Assessment/Plan: 1. 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 Physiatrist is providing close team supervision and 24 hour management of active medical problems listed below. Physiatrist and rehab team continue to assess barriers to discharge/monitor patient progress toward functional and medical goals.  Function:  Bathing Bathing position Bathing activity did not occur: Refused Position: Systems developerhower  Bathing parts Body parts bathed by patient: Right arm, Left arm, Chest, Abdomen, Front perineal area, Right upper leg, Left upper leg Body parts bathed by helper: Buttocks, Right lower leg, Left lower leg, Back  Bathing assist Assist Level: Touching or steadying assistance(Pt > 75%)      Upper Body Dressing/Undressing Upper body dressing   What is the patient wearing?: Pull over shirt/dress     Pull over shirt/dress - Perfomed by patient: Thread/unthread right sleeve, Thread/unthread left sleeve Pull over shirt/dress - Perfomed by helper: Put head through opening, Pull shirt over trunk        Upper body assist Assist Level: Supervision or verbal cues      Lower Body Dressing/Undressing Lower body dressing   What is the patient wearing?: Ted Hose, Shoes, Pants     Pants- Performed by patient: Thread/unthread right pants leg, Thread/unthread left pants leg Pants- Performed by helper: Thread/unthread right pants leg, Thread/unthread left pants leg, Pull pants up/down   Non-skid slipper socks- Performed by helper: Don/doff right sock, Don/doff left sock       Shoes - Performed by helper: Don/doff right shoe, Don/doff left shoe, Fasten right, Fasten left  TED Hose - Performed by helper: Don/doff right TED hose, Don/doff left TED hose  Lower body assist Assist for lower body dressing: 2 Helpers      Toileting Toileting Toileting activity did not occur: No continent bowel/bladder event   Toileting steps completed by helper: Adjust clothing prior to toileting, Performs perineal hygiene,  Adjust clothing after toileting    Toileting assist Assist level: Two helpers   Transfers Chair/bed transfer Chair/bed transfer activity did not occur: Safety/medical concerns Chair/bed transfer method: Lateral scoot Chair/bed transfer assist level: Maximal assist (Pt 25 - 49%/lift and lower) Chair/bed transfer assistive device: Sliding board, Armrests     Locomotion Ambulation Ambulation activity did not occur: Safety/medical concerns         Wheelchair Wheelchair activity did not occur: Safety/medical concerns Type: Manual Max wheelchair distance: 175' Assist Level: Touching or steadying assistance (Pt > 75%)  Cognition Comprehension Comprehension assist level: Follows complex conversation/direction with no assist  Expression Expression assist level: Expresses complex ideas: With no assist  Social Interaction Social Interaction assist level: Interacts appropriately with others - No medications needed.  Problem Solving Problem solving assist level: Solves complex problems: Recognizes & self-corrects  Memory Memory assist level: Complete Independence: No helper   Medical Problem List and Plan: 1. 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 Status post ACDF C4-5, 5-6.   -continue therapies  -Have discussed expectations regarding recovery and prognosis 2. DVT Prophylaxis/Anticoagulation: SCDs.    vascular study negative -initiated sq lovenox 30 q12---12 week duration post injury 3. Pain Management: Baclofen  3 times a day. Valium qhs prn  -Lyrica 150 mg daily at bedtime and  qam  -Cymbalta 30 mg daily.   -Hydrocodone and Robaxin as needed.  -increased pamelor to  qhs 4. Mood: team to provide ego support,    -anxiety with some improvement. Has big expectations of herself/recovery  -neuropsych consult at some point 5. Neuropsych: This patient is capable of making  decisions on her own behalf. 6. Skin/Wound Care: Routine skin checks  -decubitus precautions/frequent turning 7. Fluids/Electrolytes/Nutrition:  8. Neurogenic bowel and bladder.    -Have Encouraged increased fluid intake in am/less in pm to decrease overnight cath needs.   -AM soft/lax with PM suppository   -husband involved with bowel/bladder education 9. Leukocytosis due to steroids.  -decreased steroids to  q12 ---decrease further Monday  -WBC decreasing from 17K to 12 K 10. Burning with urination- getting I/O caths  UA on 5/25 with few bacteria, U culture pending, ? candidal UTI   Empiric Macrobid started on 5/26 11. Candidiasis oral and vaginal start Diflucan  LOS (Days) 15 A FACE TO FACE EVALUATION WAS PERFORMED  Erick Colace 11/02/2015 8:28 AM

## 2015-11-02 NOTE — Progress Notes (Signed)
Occupational Therapy Session Note  Patient Details  Name: Jasmine [name] MRN: [id number] Date of Birth: 08/14/1975  Today's Date: 11/02/2015 OT Individual Time: 1015-1130 OT Individual Time Calculation (min): 75 min    Short Term Goals: Week 3:  OT Short Term Goal 1 (Week 3): Pt will complete toileting task with total A of 1 in order to decrease caregiver burden.  OT Short Term Goal 2 (Week 3): Pt will roll in bed with supervision using leg loops in prep for functional task OT Short Term Goal 3 (Week 3): Pt will sit EOB to complete grooming or UB bathing task with close supervision in order to increase functional sitting balance.   Skilled Therapeutic Interventions/Progress Updates:    OT session focused on functional use of BUEs, functional transfers, sitting balance and lateral leans. Pt received supine in bed. OT donned leg loops with total A then pt completed supine>sit with max A. Pt completed SBT bed>w/c and w/c<>mat table with max A and second person present for safety. Engaged in lateral leans while seated EOM initially with 2 pillows to lean on, progressing to one pillow. Pt completed lean to 2 pillows on R side, requiring min A on 5/5 attempts to transition back to upright sitting position. Transitioned to leaning to L side with pt completing transition to sitting upright with supervision 4/5 attempts.Pt required mod A to come upright from 1 pillow. Engaged in grasp/release activity at table top with pt utilizing right hand to place 12 pegs into foam peg board with focus on use of 3 point pinch. Pt then removed pegs with L hand from board with increased time. OT positioned board vertically to allow for tenodesis grasp to assist with task. At end of session, pt left sitting in w/c with all needs in reach.  Therapy Documentation Precautions:  Precautions Precautions: Cervical Precaution Comments: orthostatic BPs Restrictions Weight Bearing Restrictions: No General:   Vital  Signs:   Pain:   ADL:   Exercises:   Other Treatments:    See Function Navigator for Current Functional Status.   Therapy/Group: Individual Therapy  Daneil Danerkinson, Serena Petterson N 11/02/2015, 12:17 PM

## 2015-11-02 NOTE — Plan of Care (Signed)
Problem: SCI BOWEL ELIMINATION Goal: RH STG MANAGE BOWEL WITH ASSISTANCE STG Manage Bowel with Mod Assistance.  Outcome: Not Progressing Bowel program  Problem: SCI BLADDER ELIMINATION Goal: RH STG MANAGE BLADDER WITH ASSISTANCE STG Manage Bladder With mod Assistance  Outcome: Not Progressing Straight cath q 6 hours Goal: RH STG MANAGE BLADDER WITH EQUIPMENT WITH ASSISTANCE STG Manage Bladder With Equipment With Mod Assistance  Outcome: Not Progressing I and O cath q 6hours

## 2015-11-02 NOTE — Plan of Care (Signed)
Problem: SCI BOWEL ELIMINATION Goal: RH STG MANAGE BOWEL WITH ASSISTANCE STG Manage Bowel with Mod Assistance.  Outcome: Not Progressing Patient remains total assistance   Problem: SCI BLADDER ELIMINATION Goal: RH STG MANAGE BLADDER WITH ASSISTANCE STG Manage Bladder With mod Assistance  Outcome: Not Progressing Requires I/O caths due to urinary retention

## 2015-11-03 ENCOUNTER — Inpatient Hospital Stay (HOSPITAL_COMMUNITY): Payer: BLUE CROSS/BLUE SHIELD | Admitting: Physical Therapy

## 2015-11-03 NOTE — Progress Notes (Signed)
[facility] PHYSICAL MEDICINE & REHABILITATION     PROGRESS NOTE    Subjective/Complaints: Feels a shock like sensation in arms and legs when looking down  ROS: + Burning with urination. Denies CP, SOB, nausea, vomiting, diarrhea.   Objective: Vital Signs: Blood pressure 133/80, pulse 76, temperature 97.5 F (36.4 C), temperature source Oral, resp. rate 16, weight 67.767 kg (149 lb 6.4 oz), last menstrual period 09/19/2015, SpO2 98 %. No results found.  Recent Labs  11/02/15 0622  WBC 12.5*  HGB 13.0  HCT 40.8  PLT 355    Recent Labs  11/02/15 0622  NA 134*  K 4.4  CL 98*  GLUCOSE 90  BUN 18  CREATININE 0.52  CALCIUM 9.0   CBG (last 3)   Recent Labs  11/01/15 1743  GLUCAP 122*    Wt Readings from Last 3 Encounters:  10/30/15 67.767 kg (149 lb 6.4 oz)  10/11/15 68.6 kg (151 lb 3.8 oz)  09/18/15 64.411 kg (142 lb)    Physical Exam:  Constitutional: She appears well-developed and well-nourished. NAD. Head: Normocephalic. Atraumatic  Eyes: Conjunctivae and EOM are normal.  Cardiovascular: Normal rate and regular rhythm.  Respiratory: Effort normal and breath sounds normal. No respiratory distress.  GI: Soft. Bowel sounds are normal. She exhibits no distension.  Musculoskeletal: She exhibits mild edema and tenderness.  Neurological: She is alert and oriented. + Lhermitte's when looking downward Sensation intact LT  BLE Motor: Right upper extremity: delt 5/5, bicep 5/5, tricep 4/5, wrist 4/5, HI 4/5.  Left upper extremity: Shoulder abduction, elbow flexion 4+ to 5/5, elbow extension 4-/5, wrist extension 3-/5, Triceps 2, finger grip 3-/5 Left lower extremity--- trace APF, 0 ADF.  Right lower extremity: HF and KE  2- to 2+/5, ADF/PF  2/5, wiggles toes Skin:  Neck incision clean and dry   Psychiatric: Her speech is normal. Thought content normal. Her mood is appropriate  Assessment/Plan: 1. 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 Physiatrist is providing close team supervision and 24 hour management of active medical problems listed below. Physiatrist and rehab team continue to assess barriers to discharge/monitor patient progress toward functional and medical goals.  Function:  Bathing Bathing position Bathing activity did not occur: Refused Position: Systems developer parts bathed by patient: Right arm, Left arm, Chest, Abdomen, Front perineal area, Right upper leg, Left upper leg Body parts bathed by helper: Buttocks, Right lower leg, Left lower leg, Back  Bathing assist Assist Level: Touching or steadying assistance(Pt > 75%)      Upper Body Dressing/Undressing Upper body dressing   What is the patient wearing?: Pull over shirt/dress     Pull over shirt/dress - Perfomed by patient: Thread/unthread right sleeve, Thread/unthread left sleeve Pull over shirt/dress - Perfomed by helper: Put head through opening, Pull shirt over trunk        Upper body assist Assist Level: Supervision or verbal cues      Lower Body Dressing/Undressing Lower body dressing   What is the patient wearing?: Ted Hose, Shoes, Pants     Pants- Performed by patient: Thread/unthread right pants leg, Thread/unthread left pants leg Pants- Performed by helper: Thread/unthread right pants leg, Thread/unthread left pants leg, Pull pants up/down   Non-skid slipper socks- Performed by helper: Don/doff right sock, Don/doff left sock       Shoes - Performed by helper: Don/doff right shoe, Don/doff  left shoe, Fasten right, Fasten left       TED Hose - Performed by helper: Don/doff right TED hose, Don/doff left TED hose  Lower body assist Assist for lower body dressing: 2 Helpers      Toileting Toileting Toileting activity did not occur: No continent bowel/bladder event   Toileting steps completed by helper:  Adjust clothing prior to toileting, Performs perineal hygiene, Adjust clothing after toileting    Toileting assist Assist level: Two helpers   Transfers Chair/bed transfer Chair/bed transfer activity did not occur: Safety/medical concerns Chair/bed transfer method: Lateral scoot Chair/bed transfer assist level: Maximal assist (Pt 25 - 49%/lift and lower) Chair/bed transfer assistive device: Sliding board, Armrests     Locomotion Ambulation Ambulation activity did not occur: Safety/medical concerns         Wheelchair Wheelchair activity did not occur: Safety/medical concerns Type: Manual Max wheelchair distance: 175' Assist Level: Touching or steadying assistance (Pt > 75%)  Cognition Comprehension Comprehension assist level: Follows complex conversation/direction with no assist  Expression Expression assist level: Expresses complex ideas: With no assist  Social Interaction Social Interaction assist level: Interacts appropriately with others - No medications needed.  Problem Solving Problem solving assist level: Solves complex problems: Recognizes & self-corrects  Memory Memory assist level: Complete Independence: No helper   Medical Problem List and Plan: 1. 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 Status post ACDF C4-5, 5-6.   -discussed her + Lhermitte's sign, evidence of hyper irritability of her healing spinal cord  -Further discussions regarding her SCI, discussed that long term prognosis will become apparent at ~7270yr post injury 2. DVT Prophylaxis/Anticoagulation: SCDs.    vascular study negative -initiated sq lovenox 30 q12---12 week duration post injury 3. Pain Management: Baclofen 15mg  3 times a day. Valium qhs prn  -Lyrica 150 mg daily at bedtime and 75mg  qam  -Cymbalta 30 mg daily.   -Hydrocodone and Robaxin as needed.  -increased pamelor to 20mg  qhs 4. Mood: team to  provide ego support,    -anxiety with some improvement. Has big expectations of herself/recovery  -neuropsych consult recommend next week 5. Neuropsych: This patient is capable of making decisions on her own behalf. 6. Skin/Wound Care: Routine skin checks  -decubitus precautions/frequent turning 7. Fluids/Electrolytes/Nutrition:  8. Neurogenic bowel and bladder.    -Have Encouraged increased fluid intake in am/less in pm to decrease overnight cath needs.   -AM soft/lax with PM suppository   -husband involved with bowel/bladder education 9. Leukocytosis due to steroids.  -decreased steroids to 4mg  q12 ---decrease further Monday  -WBC decreasing from 17K to 12 K 10. Burning with urination- getting I/O caths   Ucx neg d/c Macrobid started on 5/26 11. Candidiasis oral and vaginal on D#2 Diflucan  LOS (Days) 16 A FACE TO FACE EVALUATION WAS PERFORMED  Erick ColaceKIRSTEINS,Jamiee Milholland E 11/03/2015 7:42 AM

## 2015-11-03 NOTE — Progress Notes (Signed)
Physical Therapy Note  Patient Details  Name: Jasmine EddyBrenda Pfalzgraf MRN: [id number] Date of Birth: Oct 01, 1975 Today's Date: 11/03/2015  Patient missed 60 min skilled physical therapy due to declining to participate, reports she is waiting to be I & O cathed and was told by staff she did not have therapy this date. Late schedule changed explained to patient, but she reported plans to spend time with family after catheterization. Will f/u as able.     Bayard HuggerVarner, Tomer Chalmers A 11/03/2015, 2:11 PM

## 2015-11-04 ENCOUNTER — Inpatient Hospital Stay (HOSPITAL_COMMUNITY): Payer: BLUE CROSS/BLUE SHIELD | Admitting: Occupational Therapy

## 2015-11-04 ENCOUNTER — Inpatient Hospital Stay (HOSPITAL_COMMUNITY): Payer: BLUE CROSS/BLUE SHIELD | Admitting: Physical Therapy

## 2015-11-04 MED ORDER — DEXAMETHASONE 2 MG PO TABS
2.0000 mg | ORAL_TABLET | Freq: Two times a day (BID) | ORAL | Status: DC
  Administered 2015-11-04 – 2015-11-14 (×20): 2 mg via ORAL
  Filled 2015-11-04 (×20): qty 1

## 2015-11-04 MED ORDER — ALUM & MAG HYDROXIDE-SIMETH 200-200-20 MG/5ML PO SUSP
30.0000 mL | ORAL | Status: DC | PRN
  Administered 2015-11-04 – 2015-11-09 (×5): 30 mL via ORAL
  Filled 2015-11-04 (×5): qty 30

## 2015-11-04 MED ORDER — NYSTATIN 100000 UNIT/ML MT SUSP
5.0000 mL | Freq: Four times a day (QID) | OROMUCOSAL | Status: DC
Start: 2015-11-04 — End: 2015-11-15
  Administered 2015-11-04 – 2015-11-15 (×41): 500000 [IU] via ORAL
  Filled 2015-11-04 (×42): qty 5

## 2015-11-04 NOTE — Progress Notes (Signed)
Recreational Therapy Session Note  Patient Details  Name: Jasmine [name] MRN: [id number] Date of Birth: 09/25/1975 Today's Date: 11/04/2015  Pain: L shoulder pain, but reports improved with kinesiotape Skilled Therapeutic Interventions/Progress Updates: Session focused on discharge planning, sit to stands using [employer] Plus, weight shifting, midline balance in staning & slide board transfers using [employer] board.  Pt's husband expressing some anxiety about having time to learn everything needed for transition home.  PT informed pt & husband that they will begin training with husband as appropriate.  Therapy/Group: Co-Treatment Delynda Sepulveda 11/04/2015, 3:34 PM

## 2015-11-04 NOTE — Progress Notes (Signed)
Physical Therapy Session Note  Patient Details  Name: Jasmine [name] MRN: [id number] Date of Birth: 04/10/1976  Today's Date: 11/04/2015 PT Individual Time: 1103-1205 PT Individual Time Calculation (min): 62 min   Short Term Goals: Week 3:  PT Short Term Goal 1 (Week 3): Pt wlll be able to progess transfers to overall mod assist using slideboard on level surfaces PT Short Term Goal 2 (Week 3): Pt will be able to perform car transfer with max assist of 1 PT Short Term Goal 3 (Week 3): Pt's husband with initiate hands-on transfer training  Skilled Therapeutic Interventions/Progress Updates:   Pt received in w/c; pt's husband reports assisting with transfer this am.  Husband expressing some anxiety about having enough time to learn everything for home.  Will begin to transition all transfers to husband as appropriate.  Pt agreeable to try use of [employer]ara Plus for standing and pre-gait activities.  Pt set up in Sara sling with lateral leans and pt assisting with leg loops.  Pt performed sit > stand x 2 from w/c with [employer]ara plus and +2 A with focus on activation through LE to complete sit > stand to maintain good positioning of UE and shoulders.  Pt first presented with L knee flexion, R genu recurvatum and shoulders shifted to L; with facilitation and verbal cues pt able to shift COG forwards over BOS and maintain while performing lateral weight shifts to reposition feet; pt able to lift R foot 50% and L foot 15% off floor, unable to take steps at this time.  Pt able to stand in [employer] x 10-15 min to focus on weight shifting and activation and trunk control.  Returned to w/c to perform slideboard training.  Pt performed slideboard sequence, trunk control and balance training with use of Beasy board w/c <> mat x 2 reps with pt progressing to needing only mod A for balance while performing transfer and improved ability to shift weight.  Pt educated that [employer] board is for training purposes only and goal is to be  more independent with slideboard.  Back in room pt and husband verbally guided through w/c set up, parts management and set up of slideboard.  Husband and pt performed slideboard transfer w/c > bed with husband performing 90% of transfer; educated husband on more appropriate body positioning and level of assistance.  Performed sit > supine with max A and pt positioned in bed with all items within reach to await nursing staff for I&O catheter.  Therapy Documentation Precautions:  Precautions Precautions: Cervical Precaution Comments: orthostatic BPs Restrictions Weight Bearing Restrictions: No Pain:  Some pain in L shoulder but reports improved with kinesiotape. Other Treatments: Treatments Neuromuscular Facilitation: Right;Left;Lower Extremity;Activity to increase motor control;Activity to increase timing and sequencing;Activity to increase sustained activation;Activity to increase lateral weight shifting;Activity to increase anterior-posterior weight shifting   See Function Navigator for Current Functional Status.   Therapy/Group: Individual Therapy  Edman CircleHall, Kdyn Vonbehren [facility] 11/04/2015, 12:29 PM

## 2015-11-04 NOTE — Progress Notes (Signed)
[facility] PHYSICAL MEDICINE & REHABILITATION     PROGRESS NOTE    Subjective/Complaints: Pt sitting up in her chair working with therapies.  She had a good weekend, but complains of numbness in her 4th and 5th digits.   ROS: Denies CP, SOB, nausea, vomiting, diarrhea.   Objective: Vital Signs: Blood pressure 110/77, pulse 77, temperature 97.9 F (36.6 C), temperature source Oral, resp. rate 16, weight 67.767 kg (149 lb 6.4 oz), last menstrual period 09/19/2015, SpO2 96 %. No results found.  Recent Labs  11/02/15 0622  WBC 12.5*  HGB 13.0  HCT 40.8  PLT 355    Recent Labs  11/02/15 0622  NA 134*  K 4.4  CL 98*  GLUCOSE 90  BUN 18  CREATININE 0.52  CALCIUM 9.0   CBG (last 3)   Recent Labs  11/01/15 1743  GLUCAP 122*    Wt Readings from Last 3 Encounters:  10/30/15 67.767 kg (149 lb 6.4 oz)  10/11/15 68.6 kg (151 lb 3.8 oz)  09/18/15 64.411 kg (142 lb)    Physical Exam:  Constitutional: She appears well-developed and well-nourished. NAD. Head: Normocephalic. Atraumatic  Eyes: Conjunctivae and EOM are normal.  Cardiovascular: Normal rate and regular rhythm.  Respiratory: Effort normal and breath sounds normal. No respiratory distress.  GI: Soft. Bowel sounds are normal. She exhibits no distension.  Musculoskeletal: She exhibits mild edema and tenderness.  Neurological: She is alert and oriented.  Sensation intact LT  BLE Motor: Right upper extremity: delt 5/5, bicep 5/5, tricep 4/5, wrist 4/5, HI 4/5.  Left upper extremity: Shoulder abduction, elbow flexion 4+ to 5/5, elbow extension 4-/5, wrist extension 3-/5, Triceps 2, finger grip 3-/5 Left lower extremity--- trace APF, 1/5 ADF.  Right lower extremity: HF and KE  2- to 2+/5, ADF/PF  2/5, wiggles toes Skin:  Neck incision clean and dry   Psychiatric: Her speech is normal. Thought content normal. Her mood is appropriate  Assessment/Plan: 1. 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 Physiatrist is providing close team supervision and 24 hour management of active medical problems listed below. Physiatrist and rehab team continue to assess barriers to discharge/monitor patient progress toward functional and medical goals.  Function:  Bathing Bathing position Bathing activity did not occur: Refused Position: Systems developer parts bathed by patient: Right arm, Left arm, Chest, Abdomen, Front perineal area, Right upper leg, Left upper leg Body parts bathed by helper: Buttocks, Right lower leg, Left lower leg, Back  Bathing assist Assist Level: Touching or steadying assistance(Pt > 75%)      Upper Body Dressing/Undressing Upper body dressing   What is the patient wearing?: Pull over shirt/dress     Pull over shirt/dress - Perfomed by patient: Thread/unthread right sleeve, Thread/unthread left sleeve Pull over shirt/dress - Perfomed by helper: Put head through opening, Pull shirt over trunk        Upper body assist Assist Level: Supervision or verbal cues      Lower Body Dressing/Undressing Lower body dressing   What is the patient wearing?: Ted Hose, Shoes, Pants     Pants- Performed by patient: Thread/unthread right pants leg Pants- Performed by helper: Pull pants up/down   Non-skid slipper socks- Performed by helper: Don/doff right sock, Don/doff left sock       Shoes - Performed by helper: Don/doff right shoe, Don/doff left shoe, Fasten right, Fasten left  TED Hose - Performed by helper: Don/doff right TED hose, Don/doff left TED hose  Lower body assist Assist for lower body dressing: Touching or steadying assistance (Pt > 75%)      Toileting Toileting Toileting activity did not occur: No continent bowel/bladder event   Toileting steps completed by helper: Adjust clothing prior to toileting, Performs perineal hygiene,  Adjust clothing after toileting    Toileting assist Assist level: Two helpers   Transfers Chair/bed transfer Chair/bed transfer activity did not occur: Safety/medical concerns Chair/bed transfer method: Lateral scoot Chair/bed transfer assist level: Maximal assist (Pt 25 - 49%/lift and lower) Chair/bed transfer assistive device: Sliding board, Armrests     Locomotion Ambulation Ambulation activity did not occur: Safety/medical concerns         Wheelchair Wheelchair activity did not occur: Safety/medical concerns Type: Manual Max wheelchair distance: 175' Assist Level: Touching or steadying assistance (Pt > 75%)  Cognition Comprehension Comprehension assist level: Follows complex conversation/direction with no assist  Expression Expression assist level: Expresses complex ideas: With no assist  Social Interaction Social Interaction assist level: Interacts appropriately with others - No medications needed.  Problem Solving Problem solving assist level: Solves complex problems: Recognizes & self-corrects  Memory Memory assist level: Complete Independence: No helper   Medical Problem List and Plan: 1. 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 Status post ACDF C4-5, 5-6.   -continue therapies  -Have discussed expectations regarding recovery and prognosis 2. DVT Prophylaxis/Anticoagulation: SCDs.    vascular study negative -initiated sq lovenox 30 q12---12 week duration post injury 3. Pain Management: Baclofen 15mg  3 times a day. Valium qhs prn  -Lyrica 150 mg daily at bedtime and 75mg  qam  -Cymbalta 30 mg daily.   -Hydrocodone and Robaxin as needed.  -increased pamelor to 20mg  qhs 4. Mood: team to provide ego support,    -anxiety with some improvement. Has big expectations of herself/recovery  -neuropsych consult at some point 5. Neuropsych: This patient is capable of making  decisions on her own behalf. 6. Skin/Wound Care: Routine skin checks  -decubitus precautions/frequent turning 7. Fluids/Electrolytes/Nutrition:  8. Neurogenic bowel and bladder.    -Have Encouraged increased fluid intake in am/less in pm to decrease overnight cath needs.   -AM soft/lax with PM suppository   -husband involved with bowel/bladder education 9. Leukocytosis due to steroids.  -decreased steroids to 4mg  q12 ---decreased  To 2mg  on 5/29   -WBC decreasing from 17K to 12 K on 5/27 10. Burning with urination- getting I/O caths  UA on 5/25 with few bacteria, U culture NG  Empiric Macrobid started on 5/26, d/ced due to neg Ucx 11. Candidiasis:   Oral and vaginal started Diflucan  LOS (Days) 17 A FACE TO FACE EVALUATION WAS PERFORMED  Karmah Potocki Karis Jubanil Tiawanna Luchsinger 11/04/2015 9:37 AM

## 2015-11-04 NOTE — Progress Notes (Signed)
Occupational Therapy Session Note  Patient Details  Name: Jasmine EddyBrenda Boch MRN: [id number] Date of Birth: 1975-07-21  Today's Date: 11/04/2015 OT Individual Time: 0700-0830 and 1300-1345 OT Individual Time Calculation (min): 90 min and 45 min   Short Term Goals: Week 3:  OT Short Term Goal 1 (Week 3): Pt will complete toileting task with total A of 1 in order to decrease caregiver burden.  OT Short Term Goal 2 (Week 3): Pt will roll in bed with supervision using leg loops in prep for functional task OT Short Term Goal 3 (Week 3): Pt will sit EOB to complete grooming or UB bathing task with close supervision in order to increase functional sitting balance.   Skilled Therapeutic Interventions/Progress Updates:    Session One: Pt seen for OT ADL session and for neuro re-ed. Pt in supine upon arrival, agreeable to tx session.  She compelted LB dressing in supine, able to position LEs in modified circle sit position with min A and increased time with VCs for problem solving positioning/ technique. She was able to thread B LEs into pants. She required assist to pull up pants when rolling to the R due to decreased sensation/ strength in L UE, however, when rolling to L able to pull pants up with R UE. She managed one LE off EOB and required min-mod A to transfer to EOB using hospital bed functions. Pt's husband assisted with sliding board transfer with VCs and demonstration for proper technique and body mechanics. Pt able to direct care during transfer. Grooming completed at sink with A for managing some containers.  In therapy gym, completed 15 min of supervised E-stim for wrist extensors on level 16. Incorporated grasp/ release using tenodesis grasp while completing E-stim.  Completed ~3 on UE table top bike for UE strengthening. Pt's husband with lots of questions regarding therapy and recovery. Discussed this as a possible HEP that would be good for UE strengthening and building cardiorespiratory  endurance.  Pt returned to room with husband at end of session.  Educated throughout session regarding home layout modifications, reducing caregiver burden, transfer techniques, home exercise program, and d/c planning.   Session Two: Pt seen for OT session focusing on functional sitting balance, positioning, and family training. Pt in supine upon arrival, agreeable to tx session. Husband assisted pt with bed mobility to EOB and for sliding board transfer to w/c, requiring VCs for technique and proper body mechanics.  In therapy gym, pt's husband assisted her to therapy mat. She was positioned with total A into long sitting position with close supervision.  Had pt simulate LB dressing task using theraband in long sitting position with pt requiring min A to manage LEs.  Pt became very fatigued with task, however, was able to thread B LEs into theraband "pants".  Pt voiced liking the long sitting position, will attempt this position during tomorrow's ADL session. She sat EOM and completed 3x lateral leans to L onto two pillows, requiring min-mod A to return to midline. Pt's husband assisted with sliding board transfer with return to w/c and in room from w/c > bed. Will cont to benefit from more hands on training with transfers and bed mobility. Pt left in supine with all needs in reach.   Therapy Documentation Precautions:  Precautions Precautions: Cervical Precaution Comments: orthostatic BPs Restrictions Weight Bearing Restrictions: No Pain:   No/ denies pain  See Function Navigator for Current Functional Status.   Therapy/Group: Individual Therapy  Lewis, Kristian Coveymy C 11/04/2015, 6:43 AM

## 2015-11-05 ENCOUNTER — Inpatient Hospital Stay (HOSPITAL_COMMUNITY): Payer: BLUE CROSS/BLUE SHIELD | Admitting: Occupational Therapy

## 2015-11-05 ENCOUNTER — Inpatient Hospital Stay (HOSPITAL_COMMUNITY): Payer: BLUE CROSS/BLUE SHIELD | Admitting: *Deleted

## 2015-11-05 NOTE — Progress Notes (Signed)
Occupational Therapy Session Note  Patient Details  Name: Jasmine [name] MRN: [id number] Date of Birth: 22-Jan-1976  Today's Date: 11/05/2015 OT Individual Time: 1100-1200 OT Individual Time Calculation (min): 60 min    Short Term Goals: Week 1:  OT Short Term Goal 1 (Week 1): Pt will perform slide board transfer to drop arm commode with 1 person assist in order to decrease level of assist with functional transfers.  OT Short Term Goal 1 - Progress (Week 1): Not met OT Short Term Goal 2 (Week 1): Pt will direct slide board transfer from bed <>wheelchair withouts cues from therapist in order to display knowledge and understanding of transfer sequence. OT Short Term Goal 2 - Progress (Week 1): Progressing toward goal OT Short Term Goal 3 (Week 1): Pt will perform UB dressing with mod A in order to decrease level of assist for functional tasks. OT Short Term Goal 3 - Progress (Week 1): Met Week 2:  OT Short Term Goal 1 (Week 2): Pt will complete sliding board transfer with assist of 1 in order to decrease burden of care.  OT Short Term Goal 1 - Progress (Week 2): Partly met OT Short Term Goal 2 (Week 2): Pt will don pants with mod A from bed level OT Short Term Goal 2 - Progress (Week 2): Met OT Short Term Goal 3 (Week 2): Pt will manage LEs into circle sit position with max A in prep for dressing tasks.  OT Short Term Goal 3 - Progress (Week 2): Met Week 3:  OT Short Term Goal 1 (Week 3): Pt will complete toileting task with total A of 1 in order to decrease caregiver burden.  OT Short Term Goal 2 (Week 3): Pt will roll in bed with supervision using leg loops in prep for functional task OT Short Term Goal 3 (Week 3): Pt will sit EOB to complete grooming or UB bathing task with close supervision in order to increase functional sitting balance.   Skilled Therapeutic Interventions/Progress Updates:    Pt seen this session to practice slide board to [facility]. Discussed BSC option of drop arm with  regular seat vs drop arm with wide flat seat and pros/cons of each one.  Pt does not like the wide hole on flat seat so practiced to regular seat. Used a piece of dycem between board and seat to avoid board from slipping.  Practiced to and from with max A with 2 persons for safety. Then discussed logistics of when she would be doing this (6pm bowel program) and how to set up for it. Clothing should be removed in bed and then thick bed pad placed between her and the board. Her husband or other helper can use the pad to assist with moving her along the board. Pad then can be removed once sitting on BSC. Practiced this technique with the patient.  The drawback to the [facility] is that the soiled bucket can not be removed while she is sitting on it and the dycem has to be used otherwise the board slips.  Pt feeling overwhelmed and nervous about actually doing this at home. At end of session, this therapist found an open seat padded tub bench that can be used next to the bed as a BSC. This would eliminate the drawbacks previously mentioned. Tub bench brought into the room for the next therapist to try with her.  Pt adjusted in bed with husbands assist.   Therapy Documentation Precautions:  Precautions Precautions: Cervical Precaution Comments: orthostatic  BPs Restrictions Weight Bearing Restrictions: No    Vital Signs: Therapy Vitals Temp: 97.6 F (36.4 C) Temp Source: Oral Pulse Rate: 74 Resp: 16 BP: 124/75 mmHg Patient Position (if appropriate): Lying Oxygen Therapy SpO2: 97 % O2 Device: Not Delivered Pain: Pain Assessment Pain Assessment: 0-10 Pain Score: 4  Pain Type: Neuropathic pain;Acute pain Pain Location: Leg Pain Orientation: Right;Left Pain Descriptors / Indicators: Discomfort;Aching Pain Frequency: Constant Pain Onset: On-going Patients Stated Pain Goal: 3 Pain Intervention(s): Medication (See eMAR);Repositioned   ADL: See Function Navigator for Current Functional  Status.   Therapy/Group: Individual Therapy  [place] 11/05/2015, 9:05 AM

## 2015-11-05 NOTE — Progress Notes (Signed)
Physical Therapy Session Note  Patient Details  Name: Jasmine EddyBrenda Gonnella MRN: [id number] Date of Birth: 02/02/1976  Today's Date: 11/05/2015 PT Individual Time: 0902-1000 PT Individual Time Calculation (min): 58 min   Short Term Goals: Week 3:  PT Short Term Goal 1 (Week 3): Pt wlll be able to progess transfers to overall mod assist using slideboard on level surfaces PT Short Term Goal 2 (Week 3): Pt will be able to perform car transfer with max assist of 1 PT Short Term Goal 3 (Week 3): Pt's husband with initiate hands-on transfer training  Skilled Therapeutic Interventions/Progress Updates:    Pt received in w/c & agreeable to PT, denying c/o pain. Pt's husband present & participated during session. Pt transported room>gym via w/c total A for time management. Pt & husband able to set up w/c & complete sliding board w/c>nu-step to elevated surface with cuing from therapist for placement of sliding board completely under pt's buttocks, assistance from PT to block pt's hips to prevent pt from sliding back down board, use of step to allow pt's feet to touch floor, and cuing for sequencing. Utilized nu-step level 5 x 10 minutes with BLE & LUE & L hand support with pt focusing on activating each extremity to complete task. Provided pt with ultra lightweight 17x17 w/c & educated pt & husband on various parts of new chair. Placed theraband on L drive wheel to allow pt to grip wheel better. Pt & husband able to complete nu-step>w/c sliding board transfer with supervision from therapist. Educated pt's husband on technique for repositioning pt in w/c by standing in front of pt, having pt lean forward & then assisting pt to scoot buttocks back in chair. Pt's husband reports increased comfort with this technique as opposed to standing behind her. Pt propelled w/c over even surface & carpeted surface in dayroom, reporting fatigue when negotiating over carpet. Pt does report increased speed with ultra lightweight w/c  and notes that her back is more comfortable, as she felt she was being pushed forward when sitting in standard w/c. Recreational therapist provided pt with w/c glove for RUE. At end of session pt left in room in w/c with husband present to supervise.   Therapy Documentation Precautions:  Precautions Precautions: Cervical Precaution Comments: orthostatic BPs Restrictions Weight Bearing Restrictions: No  Pain: Pain Assessment Pain Assessment: No/denies pain   See Function Navigator for Current Functional Status.   Therapy/Group: Individual Therapy  Sandi MariscalVictoria M Jahaira Earnhart 11/05/2015, 7:57 AM

## 2015-11-05 NOTE — Plan of Care (Signed)
Problem: SCI BLADDER ELIMINATION Goal: RH STG SCI MANAGE BLADDER PROGRAM W/ASSISTANCE Patient/caregiver will manage bladder program with Min assistance prior to discharge.  Outcome: Progressing Discussed with husband I&O cath technique, observed husband perform I&O cath; continued education of subject with both patient and caregiver.

## 2015-11-05 NOTE — Progress Notes (Signed)
Occupational Therapy Session Note  Patient Details  Name: Jasmine [name] MRN: [id number] Date of Birth: 05/09/1976  Today's Date: 11/05/2015 OT Individual Time: [id number] OT Individual Time Calculation (min): 34 min    Skilled Therapeutic Interventions/Progress Updates:    Pt completed transfer from bed to wheelchair with mod assist using the sliding board.  Therapist assisted her with rolling to the therapy gym for allow for greater time with NMES.  Applied NMES to the left hand with use of Exercise Program to facilitate the digit flexors and extensors.  Encouraged pt to assist with each movement, with greater flexion noted in the index finger when she incorporated active flexion along with the electrical stimulation.  Extensors on level 8 intensity with flexors on level 8.5.  Therapist changed thumb EPL panel to create better fit and contact for thumb extension.  No adverse reactions noted to stimulation.  Pt returned to room and transferred back to bed at end of session via sliding board and mod assist.  Pt needs continued practice to figure out most appropriate foot placement for transfers from new light weight wheelchair.  Call button in reach and spouse in room as well.   Therapy Documentation Precautions:  Precautions Precautions: Cervical Precaution Comments: orthostatic BPs Restrictions Weight Bearing Restrictions: No  Pain: Pain Assessment Pain Assessment: No/denies pain ADL: See Function Navigator for Current Functional Status.   Therapy/Group: Individual Therapy  Jasmine [name] OTR/L 11/05/2015, 4:10 PM

## 2015-11-05 NOTE — Progress Notes (Signed)
Occupational Therapy Session Note  Patient Details  Name: Jasmine [name] MRN: [id number] Date of Birth: 30-Dec-1975  Today's Date: 11/05/2015 OT Individual Time: 1400-1430 OT Individual Time Calculation (min): 30 min    Skilled Therapeutic Interventions/Progress Updates:    1:1 Pt in bed when arrived. Focus on toileting and toilet transfer to padded cut out tub bench. Focus on transferring with bed pad underneath her (simulating if she had already doffed pants for meds for bowel program). Pt rolled with min A for LE management for pad to be placed and max A to come into sitting EOB. Slide board transfer to tub bench with max A with feet on step for more support for LEs. Lateral leans towards bed and into w/c to remove pad and then to replace pad after simulating emptying bowels. Discussed hygiene from caregiver from underneath with bucket removed. Pt felt more comfortable with toilet transfer to padded tub bench with cutout then the regular drop arm commode. Discussed option for wearing a longer gown for ease of clothing management but importance of always having fabric in between skin and board and fabric traveling with body to decr skin tears/ shearing. Return to bed supine with max A.  Therapy Documentation Precautions:  Precautions Precautions: Cervical Precaution Comments: orthostatic BPs Restrictions Weight Bearing Restrictions: No Pain: Pain Assessment Pain Assessment: No/denies pain  See Function Navigator for Current Functional Status.   Therapy/Group: Individual Therapy  Roney MansSmith, Ahmarion Saraceno [facility] 11/05/2015, 3:45 PM

## 2015-11-05 NOTE — Progress Notes (Signed)
Occupational Therapy Session Note  Patient Details  Name: Jasmine [name] MRN: [id number] Date of Birth: 29-Jun-1975  Today's Date: 11/05/2015 OT Individual Time: 4098-11910730-0845 OT Individual Time Calculation (min): 75 min    Short Term Goals: Week 3:  OT Short Term Goal 1 (Week 3): Pt will complete toileting task with total A of 1 in order to decrease caregiver burden.  OT Short Term Goal 2 (Week 3): Pt will roll in bed with supervision using leg loops in prep for functional task OT Short Term Goal 3 (Week 3): Pt will sit EOB to complete grooming or UB bathing task with close supervision in order to increase functional sitting balance.   Skilled Therapeutic Interventions/Progress Updates:    Pt seen for OT session focusing on LB dressing and functional transfers. Pt in supine upon arrival, agreeable to tx session. Pt completed LB dressing from bed level with HOB fully elevated. Modified circle sit/ long sit used as pt able to manage B LEs into functional position with significantly increased time and able to thread B LEs into pants. She rolled with min A to pull pants up, able to use R UE to pull pants up, however, requires assist for pulling pants up remainder of way. She sat on EOB to complete oral care, occasional steadying assist required, however, supervision for sitting balance overall.  Pt's husband assisted with supine <> EOB and EOB > w/c transfers with verbal and demonstrational cues provided for proper form and technique. In therapy gym, pt and her husband completed uneven transfer to therapy mat, required to go uphill. Pt's husband demonstrated ability to provide adequate assist and safely transfer her to/ from mat. Pt's husband transferred her to low/ soft surface couch in ADL apartment similar to what they have at home. Both pt and her husband reported feeling more comfortable with functional transfers and he is demonstrating ability to provide adequate and safe help for her. Pt returned  to room at end of session, left with all needs in reach.   Therapy Documentation Precautions:  Precautions Precautions: Cervical Precaution Comments: orthostatic BPs Restrictions Weight Bearing Restrictions: No Pain: Pain Assessment Pain Assessment: No/denies pain  See Function Navigator for Current Functional Status.   Therapy/Group: Individual Therapy  Lewis, Natia Fahmy C 11/05/2015, 7:09 AM

## 2015-11-05 NOTE — Plan of Care (Signed)
Pt's OT goals have been modified. Have upgraded sitting balance and UB bathing goals. LB dressing, toileting, and toilet transfers have been modified. See POC for goal details. Johnsie Cancel- Jackeline Gutknecht Lewis, OTR/L

## 2015-11-05 NOTE — Progress Notes (Signed)
[facility] PHYSICAL MEDICINE & REHABILITATION     PROGRESS NOTE    Subjective/Complaints: In bed putting on pants with OT. Had a pretty good weekend. Notes some tingling in right 4th and 5th fingers. Cath schedule and bowel program are working.   ROS: Denies CP, SOB, nausea, vomiting, diarrhea.   Objective: Vital Signs: Blood pressure 124/75, pulse 74, temperature 97.6 F (36.4 C), temperature source Oral, resp. rate 16, weight 67.767 kg (149 lb 6.4 oz), last menstrual period 09/19/2015, SpO2 97 %. No results found. No results for input(s): WBC, HGB, HCT, PLT in the last 72 hours. No results for input(s): NA, K, CL, GLUCOSE, BUN, CREATININE, CALCIUM in the last 72 hours.  Invalid input(s): CO CBG (last 3)  No results for input(s): GLUCAP in the last 72 hours.  Wt Readings from Last 3 Encounters:  10/30/15 67.767 kg (149 lb 6.4 oz)  10/11/15 68.6 kg (151 lb 3.8 oz)  09/18/15 64.411 kg (142 lb)    Physical Exam:  Constitutional: She appears well-developed and well-nourished. NAD. Head: Normocephalic. Atraumatic  Eyes: Conjunctivae and EOM are normal.  Cardiovascular: Normal rate and regular rhythm.  Respiratory: Effort normal and breath sounds normal. No respiratory distress.  GI: Soft. Bowel sounds are normal. She exhibits no distension.  Musculoskeletal: She exhibits mild edema and tenderness.  Neurological: She is alert and oriented.  Sensation intact LT  BLE Motor: Right upper extremity: delt 5/5, bicep 5/5, tricep 4/5, wrist 4/5, HI 4/5.  Left upper extremity: Shoulder abduction, elbow flexion 4+ to 5/5, elbow extension 4-/5, wrist extension 3-/5, Triceps 2, finger grip 3-/5 Left lower extremity--- trace ADF, 1/5 APF.  Right lower extremity: HF and KE  2- to 2+/5, ADF/PF  2/5, wiggles toes Skin:  Neck incision clean and dry   Psychiatric: Her speech is normal. Thought content normal. Her mood is appropriate  Assessment/Plan: 1. 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 Physiatrist is providing close team supervision and 24 hour management of active medical problems listed below. Physiatrist and rehab team continue to assess barriers to discharge/monitor patient progress toward functional and medical goals.  Function:  Bathing Bathing position Bathing activity did not occur: Refused Position: Systems developer parts bathed by patient: Right arm, Left arm, Chest, Abdomen, Front perineal area, Right upper leg, Left upper leg Body parts bathed by helper: Buttocks, Right lower leg, Left lower leg, Back  Bathing assist Assist Level: Touching or steadying assistance(Pt > 75%)      Upper Body Dressing/Undressing Upper body dressing   What is the patient wearing?: Pull over shirt/dress     Pull over shirt/dress - Perfomed by patient: Thread/unthread right sleeve, Thread/unthread left sleeve Pull over shirt/dress - Perfomed by helper: Put head through opening, Pull shirt over trunk        Upper body assist Assist Level: Supervision or verbal cues      Lower Body Dressing/Undressing Lower body dressing   What is the patient wearing?: Ted Hose, Shoes, Pants     Pants- Performed by patient: Thread/unthread right pants leg Pants- Performed by helper: Pull pants up/down   Non-skid slipper socks- Performed by helper: Don/doff right sock, Don/doff left sock       Shoes - Performed by helper: Don/doff right shoe, Don/doff left shoe, Fasten right, Fasten left       TED Hose - Performed by helper: Don/doff right  TED hose, Don/doff left TED hose  Lower body assist Assist for lower body dressing: Touching or steadying assistance (Pt > 75%)      Toileting Toileting Toileting activity did not occur: No continent bowel/bladder event   Toileting steps completed by helper: Adjust clothing prior to toileting, Performs  perineal hygiene, Adjust clothing after toileting    Toileting assist Assist level: Two helpers   Transfers Chair/bed transfer Chair/bed transfer activity did not occur: Safety/medical concerns Chair/bed transfer method: Lateral scoot Chair/bed transfer assist level: Maximal assist (Pt 25 - 49%/lift and lower) Chair/bed transfer assistive device: Sliding board     Locomotion Ambulation Ambulation activity did not occur: Safety/medical concerns         Wheelchair Wheelchair activity did not occur: Safety/medical concerns Type: Manual Max wheelchair distance: 175' Assist Level: Touching or steadying assistance (Pt > 75%)  Cognition Comprehension Comprehension assist level: Follows complex conversation/direction with no assist  Expression Expression assist level: Expresses complex ideas: With no assist  Social Interaction Social Interaction assist level: Interacts appropriately with others - No medications needed.  Problem Solving Problem solving assist level: Solves complex problems: Recognizes & self-corrects  Memory Memory assist level: Complete Independence: No helper   Medical Problem List and Plan: 1. 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 Status post ACDF C4-5, 5-6.   -continue therapies  -Have discussed expectations regarding recovery and prognosis 2. DVT Prophylaxis/Anticoagulation: SCDs.    vascular study negative -initiated sq lovenox 30 q12---12 week duration post injury 3. Pain Management: overall pain improved  -Baclofen 15mg  3 times a day. Valium qhs prn  -Lyrica 150 mg daily at bedtime and 75mg  qam  -Cymbalta 30 mg daily.   -Hydrocodone and Robaxin as needed.  -increased pamelor to 20mg  qhs 4. Mood: team to provide ego support,    -anxiety with some improvement. Has big expectations of herself/recovery  -neuropsych consult at some point 5. Neuropsych: This  patient is capable of making decisions on her own behalf. 6. Skin/Wound Care: Routine skin checks  -decubitus precautions/frequent turning 7. Fluids/Electrolytes/Nutrition:  8. Neurogenic bowel and bladder.    -Have Encouraged increased fluid intake in am/less in pm to decrease overnight cath needs.   -AM soft/lax with PM suppository   -husband involved with bowel/bladder education. Able to perform caths now 9. Leukocytosis due to steroids.  -decreased steroids to 4mg  q12 ---decreased  To 2mg  on 5/29 ---wean off prior to dc  -WBC decreasing from 17K to 12 K on 5/27 10. Burning with urination- getting I/O caths  UA on 5/25 with few bacteria, U culture NG  Empiric Macrobid started on 5/26, d/ced due to neg Ucx 11. Candidiasis:   Oral and vaginal started Diflucan---continue  LOS (Days) 18 A FACE TO FACE EVALUATION WAS PERFORMED  SWARTZ,ZACHARY T 11/05/2015 8:51 AM

## 2015-11-06 ENCOUNTER — Ambulatory Visit (HOSPITAL_COMMUNITY): Payer: BLUE CROSS/BLUE SHIELD | Admitting: *Deleted

## 2015-11-06 ENCOUNTER — Inpatient Hospital Stay (HOSPITAL_COMMUNITY): Payer: BLUE CROSS/BLUE SHIELD | Admitting: Physical Therapy

## 2015-11-06 ENCOUNTER — Inpatient Hospital Stay (HOSPITAL_COMMUNITY): Payer: BLUE CROSS/BLUE SHIELD | Admitting: Occupational Therapy

## 2015-11-06 MED ORDER — OMEGA-3-ACID ETHYL ESTERS 1 G PO CAPS
1.0000 g | ORAL_CAPSULE | Freq: Every day | ORAL | Status: DC
Start: 2015-11-06 — End: 2015-11-15
  Administered 2015-11-07 – 2015-11-15 (×9): 1 g via ORAL
  Filled 2015-11-06 (×10): qty 1

## 2015-11-06 MED ORDER — B COMPLEX-C PO TABS
1.0000 | ORAL_TABLET | Freq: Every day | ORAL | Status: DC
Start: 2015-11-06 — End: 2015-11-15
  Administered 2015-11-07 – 2015-11-15 (×9): 1 via ORAL
  Filled 2015-11-06 (×9): qty 1

## 2015-11-06 NOTE — Progress Notes (Signed)
Patient ID: Willy EddyBrenda Freiman, female   DOB: [birth year], 40 y.o.   MRN: [id number] BP 102/66 mmHg  Pulse 92  Temp(Src) 98.1 F (36.7 C) (Oral)  Resp 18  Wt 67.767 kg (149 lb 6.4 oz)  SpO2 96%  LMP 09/19/2015 Alert and oriented x 4 Complaining of lhermitte's sign, will order mri.

## 2015-11-06 NOTE — Progress Notes (Signed)
Occupational Therapy Session Note  Patient Details  Name: Jasmine EddyBrenda Mandigo MRN: [id number] Date of Birth: 1975/08/14  Today's Date: 11/06/2015 OT Individual Time: 1400-1500 OT Individual Time Calculation (min): 60 min    Short Term Goals: Week 3:  OT Short Term Goal 1 (Week 3): Pt will complete toileting task with total A of 1 in order to decrease caregiver burden.  OT Short Term Goal 2 (Week 3): Pt will roll in bed with supervision using leg loops in prep for functional task OT Short Term Goal 3 (Week 3): Pt will sit EOB to complete grooming or UB bathing task with close supervision in order to increase functional sitting balance.   Skilled Therapeutic Interventions/Progress Updates:    Pt. Seen during skilled OT session to to increase sitting balance. OT initially instructed husband on proper ways to transfer using a sliding board. After instruction husband was able to successfully transfer pt. to therapy mat. OT then encouraged pt. to use large mirror for visual cues on positioning. With cueing from OT and min A  pt. was able to position herself and hold her balance for several minutes. Pt. Reported she felt a stretch in her core, but had trouble holding her shoulders correctly. OT got behind pt. and had her work on bringing her shoulders back to open chest. Pt. Said this felt much better. OT guided pt from moderate posterior pelvic tilt to anterior pelvic til using wedge underneath pt. In this position pt. worked on lengthenging chest muscles and increasing her balance as she reached for therapy cups. OT then positioned pt. in supine over rolled towel to work on stretching chest muscles. OT positioned pt on side over bolster to facilitate left abdominal muscle lengthening. Upon completion of therapy, husband transferred pt back to w/c, but required additional assistance from OT to educate on positioning feet and using foot block for increased stability.   Therapy Documentation Precautions:   Precautions Precautions: Cervical Precaution Comments: orthostatic BPs Restrictions Weight Bearing Restrictions: No Pain: Pain Assessment Pain Assessment: No/denies pain  See Function Navigator for Current Functional Status.   Therapy/Group: Individual Therapy  Malachi BondsHannah Lochlin Eppinger 11/06/2015, 3:09 PM

## 2015-11-06 NOTE — Progress Notes (Signed)
[place] PHYSICAL MEDICINE & REHABILITATION     PROGRESS NOTE    Subjective/Complaints: Overall doing well. Noted cool, red LLE during day---resolved on its own. No further symptoms.    ROS: Denies CP, SOB, nausea, vomiting, diarrhea.   Objective: Vital Signs: Blood pressure 130/77, pulse 70, temperature 98.6 F (37 C), temperature source Oral, resp. rate 18, weight 67.767 kg (149 lb 6.4 oz), last menstrual period 09/19/2015, SpO2 98 %. No results found. No results for input(s): WBC, HGB, HCT, PLT in the last 72 hours. No results for input(s): NA, K, CL, GLUCOSE, BUN, CREATININE, CALCIUM in the last 72 hours.  Invalid input(s): CO CBG (last 3)  No results for input(s): GLUCAP in the last 72 hours.  Wt Readings from Last 3 Encounters:  10/30/15 67.767 kg (149 lb 6.4 oz)  10/11/15 68.6 kg (151 lb 3.8 oz)  09/18/15 64.411 kg (142 lb)    Physical Exam:  Constitutional: She appears well-developed and well-nourished. NAD. Head: Normocephalic. Atraumatic  Eyes: Conjunctivae and EOM are normal.  Cardiovascular: Normal rate and regular rhythm.  Respiratory: Effort normal and breath sounds normal. No respiratory distress.  GI: Soft. Bowel sounds are normal. She exhibits no distension.  Musculoskeletal: She exhibits mild edema and tenderness.  Neurological: She is alert and oriented.  Sensation intact LT  BLE Motor: Right upper extremity: delt 5/5, bicep 5/5, tricep 4/5, wrist 4/5, HI 4/5.  Left upper extremity: Shoulder abduction, elbow flexion 4+ to 5/5, elbow extension 4-/5, wrist extension 3-/5, Triceps 2, finger grip 3-/5 Left lower extremity--- trace ADF, 1/5 APF.  Right lower extremity: HF and KE  2- to 2+/5, ADF/PF  2/5, wiggles toes Skin:  Neck incision clean and dry   Psychiatric: Her speech is normal. Thought content normal. Her mood is appropriate  Assessment/Plan: 1. 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 Physiatrist is providing close team supervision and 24 hour management of active medical problems listed below. Physiatrist and rehab team continue to assess barriers to discharge/monitor patient progress toward functional and medical goals.  Function:  Bathing Bathing position Bathing activity did not occur: Refused Position: Systems developer parts bathed by patient: Right arm, Left arm, Chest, Abdomen, Front perineal area, Right upper leg, Left upper leg Body parts bathed by helper: Buttocks, Right lower leg, Left lower leg, Back  Bathing assist Assist Level: Touching or steadying assistance(Pt > 75%)      Upper Body Dressing/Undressing Upper body dressing Upper body dressing/undressing activity did not occur: Refused What is the patient wearing?: Pull over shirt/dress     Pull over shirt/dress - Perfomed by patient: Thread/unthread right sleeve, Thread/unthread left sleeve Pull over shirt/dress - Perfomed by helper: Put head through opening, Pull shirt over trunk        Upper body assist Assist Level: Supervision or verbal cues      Lower Body Dressing/Undressing Lower body dressing   What is the patient wearing?: Pants, Ted Hose, Shoes     Pants- Performed by patient: Thread/unthread right pants leg, Thread/unthread left pants leg Pants- Performed by helper: Pull pants up/down   Non-skid slipper socks- Performed by helper: Don/doff right sock, Don/doff left sock       Shoes - Performed by helper: Don/doff right shoe, Don/doff left shoe, Fasten right, Fasten left       TED Hose - Performed by helper: Don/doff right TED hose,  Don/doff left TED hose  Lower body assist Assist for lower body dressing: Touching or steadying assistance (Pt > 75%)      Toileting Toileting Toileting activity did not occur: No continent bowel/bladder event   Toileting steps completed by helper: Adjust  clothing prior to toileting, Performs perineal hygiene, Adjust clothing after toileting    Toileting assist Assist level: Two helpers   Transfers Chair/bed transfer Chair/bed transfer activity did not occur: Safety/medical concerns Chair/bed transfer method: Lateral scoot Chair/bed transfer assist level: Moderate assist (Pt 50 - 74%/lift or lower) Chair/bed transfer assistive device: Sliding board, Armrests     Locomotion Ambulation Ambulation activity did not occur: Safety/medical concerns         Wheelchair Wheelchair activity did not occur: Safety/medical concerns Type: Manual Max wheelchair distance: 150 ft Assist Level: Supervision or verbal cues  Cognition Comprehension Comprehension assist level: Follows complex conversation/direction with no assist  Expression Expression assist level: Expresses complex ideas: With no assist  Social Interaction Social Interaction assist level: Interacts appropriately with others - No medications needed.  Problem Solving Problem solving assist level: Solves complex problems: Recognizes & self-corrects  Memory Memory assist level: Complete Independence: No helper   Medical Problem List and Plan: 1. 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 Status post ACDF C4-5, 5-6.   -continue therapies  -husband quite engaged.   -pt showing daily progress 2. DVT Prophylaxis/Anticoagulation: SCDs.    vascular study negative -initiated sq lovenox 30 q12---12 week duration post injury 3. Pain Management: overall pain improved  -Baclofen 15mg  3 times a day. Valium qhs prn  -Lyrica 150 mg daily at bedtime and 75mg  qam  -Cymbalta 30 mg daily.   -Hydrocodone and Robaxin as needed.  -increased pamelor to 20mg  qhs 4. Mood: team to provide ego support,    -anxiety with some improvement. Has big expectations of herself/recovery  -neuropsych consult at some  point 5. Neuropsych: This patient is capable of making decisions on her own behalf. 6. Skin/Wound Care: Routine skin checks  -decubitus precautions/frequent turning 7. Fluids/Electrolytes/Nutrition:  8. Neurogenic bowel and bladder.    -Have Encouraged increased fluid intake in am/less in pm to decrease overnight cath needs.   -AM soft/lax with PM suppository   -husband involved with bowel/bladder education. Able to perform caths now 9. Leukocytosis due to steroids.  -decreased steroids to 4mg  q12 ---decreased  To 2mg  on 5/29 ---wean further this week  -WBC decreasing from 17K to 12 K on 5/27 10. Burning with urination- improved 11. Candidiasis:   Oral and vaginal started Diflucan---continue, weaning steroids  LOS (Days) 19 A FACE TO FACE EVALUATION WAS PERFORMED  Jasmine [name] T 11/06/2015 9:15 AM

## 2015-11-06 NOTE — Progress Notes (Signed)
Social Work Patient ID: Jasmine [name], female   DOB: 08/11/1975, 40 y.o.   MRN: [id number]   Met with pt and spouse yesterday afternoon to review team conference.  Both feel that they are seeing "some progress" but pt still frustrated with slow recovery.  She reports it is difficult to stay hopeful.  "I just really hoped I would be walking."  Husband remains very supportive but both appear fatigued and frustrated.  Will continue to follow for support and d/c planning needs.  Mystery Schrupp, LCSW

## 2015-11-06 NOTE — Progress Notes (Signed)
Physical Therapy Session Note  Patient Details  Name: Jasmine [name] MRN: [id number] Date of Birth: [birth year]  Today's Date: 11/06/2015 PT Individual Time: 1305-1400 PT Individual Time Calculation (min): 55 min   Short Term Goals: Week 3:  PT Short Term Goal 1 (Week 3): Pt wlll be able to progess transfers to overall mod assist using slideboard on level surfaces PT Short Term Goal 2 (Week 3): Pt will be able to perform car transfer with max assist of 1 PT Short Term Goal 3 (Week 3): Pt's husband with initiate hands-on transfer training  Skilled Therapeutic Interventions/Progress Updates:    Pt received in bed & agreeable to PT, denying c/o pain. PT & husband assisted pt with donning leg loops total A. Pt able to transfer supine>sit with use of bed features & mod A, assisting pt to transfer BLE to edge of bed and to right trunk to sit EOB. Pt & husband completed set up & sliding board transfer bed>w/c with supervision from PT. Transported pt to ortho gym & educated pt & husband on bumping pt up single step in w/c. PT demonstrated with pt, then pt & husband able to return demonstrate with supervision from PT with them both voicing comfort with task. Pt & husband completed transfer w/c>mat table with sliding board and cuing from PT for husband to support pt under hips/buttocks. Re-educated pt on head/hips relationship to assist with transfers and repositioning in chair. While sitting edge of mat utilized Wii bowling with LUE support then without BUE support focusing on balance, sitting endurance, and posture. PT provided facilitation at L trunk & R shoulders for increased upright alignment. Transitioned from Wii to reaching anteriorly to obtain cones with continued facilitation from PT for postural alignment and anterior weight shifts. Pt reports activity was moderate to very difficult but did demonstrate improved ability to correct posture with less assistance from therapist. Pt & husband completed  sliding board transfer mat>w/c with supervision from PT and at end of session pt left with husband in hand off to OT.  Therapy Documentation Precautions:  Precautions Precautions: Cervical Precaution Comments: orthostatic BPs Restrictions Weight Bearing Restrictions: No  Pain: Pain Assessment Pain Assessment: No/denies pain   See Function Navigator for Current Functional Status.   Therapy/Group: Individual Therapy  Sandi MariscalVictoria M Kimberly Coye 11/06/2015, 12:48 PM

## 2015-11-06 NOTE — Progress Notes (Signed)
Physical Therapy Session Note  Patient Details  Name: Jasmine [name] MRN: [id number] Date of Birth: 1976-05-23  Today's Date: 11/06/2015 PT Individual Time: 1009-1105 PT Individual Time Calculation (min): 56 min   Short Term Goals: Week 3:  PT Short Term Goal 1 (Week 3): Pt wlll be able to progess transfers to overall mod assist using slideboard on level surfaces PT Short Term Goal 2 (Week 3): Pt will be able to perform car transfer with max assist of 1 PT Short Term Goal 3 (Week 3): Pt's husband with initiate hands-on transfer training  Skilled Therapeutic Interventions/Progress Updates:    Pt received resting upright in w/c with no c/o pain and agreeable to therapy session.  Session focus on family education for slide board transfers and NMR via nustep forced use, kicking a ball, and LUE PNF diagonals.    Pt's husband performed slide board transfers throughout session with mid/mod multimodal cues for correct set up of transfer, body mechanics, and pt positioning.  PT provided verbal cues to pt for chest forward but keeping head up to reduce neck pain with frequent transfers and pt reports improved comfort.    Pt performed Nustep at level 5 x12 minutes with L hand splint, and BLEs for forced use, reciprocal stepping pattern, and strengthening.  Pt engaged in dynamic sitting balance sitting edge of therapy mat with supervision>min assist while using LLE to kick a ball x3 trials until fatigue.  Pt demos improved quad contraction with practice and demonstrated one instance of visible hamstring contraction.  Pt performed D1 PNF diagonal with LUE first trial to fatigue AROM, second trial to fatigue with light resistance from PT.    Pt transferred back to bed at end of session by husband with therapist providing min verbal cues for body mechanics during transfer and during sit>supine.  Pt positioned to comfort with call bell in reach and needs met.   Therapy Documentation Precautions:   Precautions Precautions: Cervical Precaution Comments: orthostatic BPs Restrictions Weight Bearing Restrictions: No   See Function Navigator for Current Functional Status.   Therapy/Group: Individual Therapy  Earnest Conroy Penven-Crew 11/06/2015, 12:21 PM

## 2015-11-06 NOTE — Progress Notes (Signed)
Pt husband has demonstrated proper technique for management of bowel program and I&O cath with minimal assistance from RN. RN continue to educate husband. Husband to continue to manage bowel and bladder with minimal assistance from staff.

## 2015-11-06 NOTE — Patient Care Conference (Signed)
Inpatient RehabilitationTeam Conference and Plan of Care Update Date: 11/05/2015   Time: 2:10 PM    Patient Name: Jasmine [name]      Medical Record Number: [id number]  Date of Birth: [birth year] Sex: Female         Room/Bed: 4W25C/4W25C-01 Payor Info: Payor: BLUE CROSS BLUE SHIELD / Plan: BCBS OTHER / Product Type: *No Product type* /    Admitting Diagnosis: CERVICAL STENOSIS WITH MYELOPATHY  Admit Date/Time:  10/18/2015  6:46 PM Admission Comments: No comment available   Primary Diagnosis:  Spinal cord injury at C5-C7 level without injury of spinal bone (HCC) Principal Problem: Spinal cord injury at C5-C7 level without injury of spinal bone [facility])  Patient Active Problem List   Diagnosis Date Noted  . Acute lower UTI   . Adjustment disorder with depressed mood   . Neurogenic bowel 10/21/2015  . Neurogenic bladder 10/21/2015  . 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 Elective surgery   . Postoperative pain   . Respiratory depression   . Leukocytosis   . Generalized OA   . Allodynia   . Spinal cord injury at C5-C7 level without injury of spinal bone (HCC)   . HNP (herniated nucleus pulposus), cervical 10/10/2015  . Fusion of spine of cervical region 10/10/2015  . Acute paraplegia (HCC) 10/10/2015  . Neural foraminal stenosis of cervical spine 09/19/2015  . Cervical radiculopathy due to degenerative joint disease of spine 09/17/2015  . Left shoulder pain 09/16/2015  . Neck pain 09/16/2015  . Right hand weakness 08/06/2015  . Right shoulder pain 04/12/2015  . Insomnia 04/12/2015  . Adjustment disorder with mixed anxiety and depressed mood 03/13/2013  . Chronic lumbar radiculopathy 06/16/2011    Expected Discharge Date: Expected Discharge Date: 11/15/15  Team Members Present: Physician leading conference: Dr. Faith Rogue Social Worker Present: Amada Jupiter, LCSW Nurse Present: Chana Bode, RN PT Present: Katherine Mantle, PT OT Present: Johnsie Cancel, OT SLP Present:  Feliberto Gottron, SLP PPS Coordinator present : Tora Duck, RN, CRRN     Current Status/Progress Goal Weekly Team Focus  Medical   slow neuro changes, right more than left. left hand substantially week still. bowel and bladder programs regulated  neurological recovery   pain/spasticity mgt, skin care, continued b/b maintenance   Bowel/Bladder   6p bowel program, rectal tone, regaining sensation; I&O cath schedule in place, husband educated and beginning demonstration  Managed bowel and bladder program  Continue to monitor effectiveness of bowel program and progress as well as I&O cath schedule with focus on husband's education   Swallow/Nutrition/ Hydration             ADL's   Mod A LB dressing' set-up UB bathing/dressing and grooming; Total A toileting; mod-max sliding board transfers  Min A overall; likely to downgrade to mod A overall  ADL re-training; family ed, toileting; functional transfers; d/c planning   Mobility   Mod-max a for bed mobility and slideboard transfers, supervision-min A w/c mobility but causes some L shoulder/neck pain.  Supervision-min A for sitting balance; total A in lift equipment for standing  min assist w/c level transfers; therapeutic ambulation; mod I w/c mobility  NMR bilat LE and UE, pain management, balance training, transfer training with husband, continue to assess if pt will require specialty w/c evaluation   Communication             Safety/Cognition/ Behavioral Observations            Pain   PRN Norco, seldom  during day (1 tab), typically 2 tab HS; rating 5 on 0-10 pain scale  < 4 on 0-10 pain scale  Monitor for nonverbal cues of pain   Skin   No skin issues; incision healed  No additional skin breakdown   Continue monitoring skin qshift and encourage pt and husband to change positions    Rehab Goals Patient on target to meet rehab goals: Yes *See Care Plan and progress notes for long and short-term goals.  Barriers to Discharge: ongoing neuro  deficits    Possible Resolutions to Barriers:  continued training for life at home after discharge, specific techniques, etc    Discharge Planning/Teaching Needs:  Home with family.  Husband and son planning to adjust work shifts in order to provide 24/7 care  ongoing   Team Discussion:  B/b program progressing well.  Husband is learning caths and doing more hands on with sl board transfers.  Making steady progress and on track for targeted d/c date.  Revisions to Treatment Plan:  None   Continued Need for Acute Rehabilitation Level of Care: The patient requires daily medical management by a physician with specialized training in physical medicine and rehabilitation for the following conditions: Daily direction of a multidisciplinary physical rehabilitation program to ensure safe treatment while eliciting the highest outcome that is of practical value to the patient.: Yes Daily medical management of patient stability for increased activity during participation in an intensive rehabilitation regime.: Yes Daily analysis of laboratory values and/or radiology reports with any subsequent need for medication adjustment of medical intervention for : Neurological problems;Urological problems;Post surgical problems  Threasa Kinch 11/06/2015, 12:02 PM

## 2015-11-06 NOTE — Progress Notes (Signed)
Occupational Therapy Session Note  Patient Details  Name: Jasmine [name] MRN: [id number] Date of Birth: 23-Dec-1975  Today's Date: 11/06/2015 OT Individual Time: 4098-11910730-0830 OT Individual Time Calculation (min): 60 min    Short Term Goals: Week 3:  OT Short Term Goal 1 (Week 3): Pt will complete toileting task with total A of 1 in order to decrease caregiver burden.  OT Short Term Goal 2 (Week 3): Pt will roll in bed with supervision using leg loops in prep for functional task OT Short Term Goal 3 (Week 3): Pt will sit EOB to complete grooming or UB bathing task with close supervision in order to increase functional sitting balance.   Skilled Therapeutic Interventions/Progress Updates:    Pt seen for OT session focusing on functional transfers and UE movement/ coordination. Pt in supine upon arrival, reporting husband had already assisted with bathing/dressing and desired to "work on other things besides bathing/ dressing". Educated regarding role of OT and OT goals.  She completed toilet transfer to padded tub bench, able to complete with max A. Pt's husband assisted with return to bed with VCs for proper form and technique. Pt voiced feeling much more comfortable with toilet transfer today compared to yesterday.  Discussed toileting options at home and recommendations for wearing a gown in order to ease clothing management aspect of toileting task.  Husband assisted with transfer EOB> w/c without need for assist.  In therapy gym, completed functional reaching/ grasping task using small plastic cups, bean bags, and paper cups using L UE and tenodesis grasp. Pt noted to have slightly increased finger flexion with wrist movement eliminated, however, still unable to extend fingers.  Pt returned to room with husband at end of session.  Therapy Documentation Precautions:  Precautions Precautions: Cervical Precaution Comments: orthostatic BPs Restrictions Weight Bearing Restrictions: No Pain:   No/ denies pain  See Function Navigator for Current Functional Status.   Therapy/Group: Individual Therapy  Lewis, Abdikadir Fohl C 11/06/2015, 7:05 AM

## 2015-11-07 ENCOUNTER — Inpatient Hospital Stay (HOSPITAL_COMMUNITY): Payer: BLUE CROSS/BLUE SHIELD | Admitting: Occupational Therapy

## 2015-11-07 ENCOUNTER — Inpatient Hospital Stay (HOSPITAL_COMMUNITY): Payer: BLUE CROSS/BLUE SHIELD | Admitting: Physical Therapy

## 2015-11-07 ENCOUNTER — Inpatient Hospital Stay (HOSPITAL_COMMUNITY): Payer: BLUE CROSS/BLUE SHIELD

## 2015-11-07 LAB — URINALYSIS, ROUTINE W REFLEX MICROSCOPIC
BILIRUBIN URINE: NEGATIVE
Glucose, UA: NEGATIVE mg/dL
HGB URINE DIPSTICK: NEGATIVE
Ketones, ur: NEGATIVE mg/dL
Leukocytes, UA: NEGATIVE
NITRITE: NEGATIVE
PH: 7 (ref 5.0–8.0)
Protein, ur: NEGATIVE mg/dL
SPECIFIC GRAVITY, URINE: 1.011 (ref 1.005–1.030)

## 2015-11-07 MED ORDER — DIAZEPAM 5 MG PO TABS
5.0000 mg | ORAL_TABLET | Freq: Once | ORAL | Status: AC
  Administered 2015-11-07: 5 mg via ORAL
  Filled 2015-11-07: qty 1

## 2015-11-07 MED ORDER — DIAZEPAM 5 MG PO TABS
5.0000 mg | ORAL_TABLET | Freq: Every evening | ORAL | Status: DC | PRN
  Administered 2015-11-07 – 2015-11-14 (×8): 5 mg via ORAL
  Filled 2015-11-07 (×8): qty 1

## 2015-11-07 NOTE — Progress Notes (Signed)
Patient complains of a burning discomfort in bladder area. Bladder scan shows 85ml. Will re-scan prior to night time cath and post-cath.

## 2015-11-07 NOTE — Progress Notes (Signed)
Physical Therapy Session Note  Patient Details  Name: Jasmine [name] MRN: [id number] Date of Birth: May 06, 1976  Today's Date: 11/07/2015 PT Individual Time: 1115-1200 PT Individual Time Calculation (min): 45 min   Short Term Goals: Week 3:  PT Short Term Goal 1 (Week 3): Pt wlll be able to progess transfers to overall mod assist using slideboard on level surfaces PT Short Term Goal 2 (Week 3): Pt will be able to perform car transfer with max assist of 1 PT Short Term Goal 3 (Week 3): Pt's husband with initiate hands-on transfer training  Skilled Therapeutic Interventions/Progress Updates:    Pt received seated in w/c, denies pain however very fatigued from several morning therapy sessions. Pt transported room <>gym totalA for energy conservation. Standing frame x25 min with vitals WNL; BP 111/67 and HR 106 with pt asymptomatic throughout. While in standing, pt performed terminal knee extension and eccentric control to mini-squat position with RLE; unable to activate enough for visible movement on LLE. Harness lowered and pt cued for hip extension with trace muscle contraction B glutes. Returned to room as above; pt's husband performed lateral scoot with pt using transfer board and modA. Instructed p and husband in technique for performing sit >supine. Attempted BLE short arc quad; trace contraction LLE, full ROM RLE with cues for eccentric contraction. Remained supine in bed at completion of session, all needs within reach.   Therapy Documentation Precautions:  Precautions Precautions: Cervical Precaution Comments: orthostatic BPs Restrictions Weight Bearing Restrictions: No Pain: Pain Assessment Pain Assessment: No/denies pain Pain Score: 0-No pain   See Function Navigator for Current Functional Status.   Therapy/Group: Individual Therapy  Vista Lawmanlizabeth J Tygielski 11/07/2015, 12:04 PM

## 2015-11-07 NOTE — Progress Notes (Signed)
Occupational Therapy Session Note  Patient Details  Name: Jasmine EddyBrenda Cardoza MRN: [id number] Date of Birth: [birth year]  Today's Date: 11/07/2015 OT Individual Time: 1305-1405 OT Individual Time Calculation (min): 60 min    Short Term Goals: Week 3:  OT Short Term Goal 1 (Week 3): Pt will complete toileting task with total A of 1 in order to decrease caregiver burden.  OT Short Term Goal 2 (Week 3): Pt will roll in bed with supervision using leg loops in prep for functional task OT Short Term Goal 3 (Week 3): Pt will sit EOB to complete grooming or UB bathing task with close supervision in order to increase functional sitting balance.   Skilled Therapeutic Interventions/Progress Updates:    1:1 Pt and pt's husband performed bed mobility  With HOB elevated and slide board transfer into w/c with mi VC from therapist. Neuromuscular reeducation with focus on functional use of left Ue including manipulation of items, thumb opposition, lateral key pinch. Continued practice with yellow soft foam block and tan theraputty.  Pt with more accuracy and fluidity of movement with pt's eyes closed (pt's choice during tasks- not distracted by perfection. Also practiced lateral leans from w/c simulating latera leans on tub bench (cutout padded) for clothing management during toileting with theraband. Discussed use of leg loops to assist with pt's weight shifts (either assisting herself or used by husband to help manage her legs during clothing management.    Therapy Documentation Precautions:  Precautions Precautions: Cervical Precaution Comments: orthostatic BPs Restrictions Weight Bearing Restrictions: No Pain:  c/o of discomfort with neck flexion (avoid position)/ numbness in bilateral hands and fatigue- allowed for rest breaks  See Function Navigator for Current Functional Status.   Therapy/Group: Individual Therapy  Roney MansSmith, Shatona Andujar [facility] 11/07/2015, 10:54 PM

## 2015-11-07 NOTE — Progress Notes (Signed)
RN called to room by patient with c/o burning at vagina. Pt states, "I know I'm already taking medicine but it's getting worse." Pt requesting pain medication, 2 norco given.  Discussed medications with patient and to follow-up tomorrow with dayshift RN and MD.  Will continue to monitor.

## 2015-11-07 NOTE — Progress Notes (Signed)
Occupational Therapy Session Note  Patient Details  Name: Jasmine [name] MRN: [id number] Date of Birth: May 04, 1976  Today's Date: 11/07/2015 OT Individual Time: [id number] OT Individual Time Calculation (min): 58 min    Short Term Goals: Week 3:  OT Short Term Goal 1 (Week 3): Pt will complete toileting task with total A of 1 in order to decrease caregiver burden.  OT Short Term Goal 2 (Week 3): Pt will roll in bed with supervision using leg loops in prep for functional task OT Short Term Goal 3 (Week 3): Pt will sit EOB to complete grooming or UB bathing task with close supervision in order to increase functional sitting balance.   Skilled Therapeutic Interventions/Progress Updates:    Pt seen for OT session focusing on core strengthening/ stability, sitting balance/ posture, and fine motor coordination. OT arrived for initial tx session at 730, however, pt about to go off unit for MRI.  OT returned to make up session at 1015 following PT session.  Pt completed functional transfer w/c <> padded tub bench which she plans to use for toileting needs at d/c. Pt's husband provided hands on assist for transfer. Pt voiced feeling increased comfort and confidence with use of padded tub bench [facility] and feeling more comfortable with transfers in general. Seated on EOM in therapy gym, worked on anterior/posterior weight shift for core strengthening/ stability. UE support required for weightshift. Pt demonstrated ability to complete controlled anterior/posterior weight shifts with close supervision- occasional min A.  Pt completed fine motor task sitting EOM, playing game of connect-4. Pt able touse tenodesis grasp of L UE to pick up and place game pieces with slightly increased time. Pt attempted use of 3- jaw chuck grasp, however, was unable to grasp pieces in that manner. Pt returned to room at end of session with hand off to PT.  Therapy Documentation Precautions:   Precautions Precautions: Cervical Precaution Comments: orthostatic BPs Restrictions Weight Bearing Restrictions: No Pain:   No/ denies pain  See Function Navigator for Current Functional Status.   Therapy/Group: Individual Therapy  Lewis, Vallory Oetken C 11/07/2015, 7:05 AM

## 2015-11-07 NOTE — Plan of Care (Signed)
Problem: SCI BLADDER ELIMINATION Goal: RH STG MANAGE BLADDER WITH EQUIPMENT WITH ASSISTANCE STG Manage Bladder With Equipment With total Assistance  Outcome: Progressing Husband performing I&O caths appropriately with staff supervision, continuing education.

## 2015-11-07 NOTE — Progress Notes (Signed)
Physical Therapy Session Note  Patient Details  Name: Jasmine [name] MRN: [id number] Date of Birth: Oct 16, 1975  Today's Date: 11/07/2015 PT Individual Time: 0900-1015 PT Individual Time Calculation (min): 75 min   Short Term Goals: Week 3:  PT Short Term Goal 1 (Week 3): Pt wlll be able to progess transfers to overall mod assist using slideboard on level surfaces PT Short Term Goal 2 (Week 3): Pt will be able to perform car transfer with max assist of 1 PT Short Term Goal 3 (Week 3): Pt's husband with initiate hands-on transfer training  Skilled Therapeutic Interventions/Progress Updates:   Pt reports she just came back from MRI and feeling groggy but willing to try therapy. Husband engaged in hands on for all transfers with intermittent cues for improved technique and allowing patient to attempt to move first before he provides all the assist. Discussed recommendation for hospital bed at home and why we will go route of rental w/c vs specialty ultralightweight w/c. Pt and husband in agreement and understanding. Nustep for reciprocal movement pattern re-training, motor activation and forced use using L UE adaptive splint x 30 min total (11 min on level 5 and rest on level 4). Seated core/balance training and fine motor of LUE control to manipulate animal figurines. Pt able to maintain static balance with supervision. Trial with standard legrests to decrease weight of w/c.   Therapy Documentation Precautions:  Precautions Precautions: Cervical Precaution Comments: orthostatic BPs Restrictions Weight Bearing Restrictions: No  Pain:   Reports shooting pain from cervical flexion - awaiting MRI results and limiting cervical flexion.   See Function Navigator for Current Functional Status.   Therapy/Group: Individual Therapy  Karolee StampsGray, Tyrie Porzio Darrol PokeBrescia  Zenovia Justman B. Aayushi Solorzano, PT, DPT  11/07/2015, 12:20 PM

## 2015-11-07 NOTE — Progress Notes (Signed)
[place] PHYSICAL MEDICINE & REHABILITATION     PROGRESS NOTE    Subjective/Complaints: Having more neck stiffness and shooting pain down neck into exts. Complains of urinary urgency/burning.    ROS: Denies CP, SOB, nausea, vomiting, diarrhea.   Objective: Vital Signs: Blood pressure 125/75, pulse 77, temperature 98.5 F (36.9 C), temperature source Oral, resp. rate 16, weight 67.767 kg (149 lb 6.4 oz), last menstrual period 09/19/2015, SpO2 97 %. No results found. No results for input(s): WBC, HGB, HCT, PLT in the last 72 hours. No results for input(s): NA, K, CL, GLUCOSE, BUN, CREATININE, CALCIUM in the last 72 hours.  Invalid input(s): CO CBG (last 3)  No results for input(s): GLUCAP in the last 72 hours.  Wt Readings from Last 3 Encounters:  10/30/15 67.767 kg (149 lb 6.4 oz)  10/11/15 68.6 kg (151 lb 3.8 oz)  09/18/15 64.411 kg (142 lb)    Physical Exam:  Constitutional: She appears well-developed and well-nourished. NAD. Head: Normocephalic. Atraumatic  Eyes: Conjunctivae and EOM are normal.  Cardiovascular: Normal rate and regular rhythm.  Respiratory: Effort normal and breath sounds normal. No respiratory distress.  GI: Soft. Bowel sounds are normal. She exhibits no distension.  Musculoskeletal: She exhibits mild edema and tenderness.  Neurological: She is alert and oriented.  Sensation intact LT  BLE Motor: Right upper extremity: delt 5/5, bicep 5/5, tricep 4/5, wrist 4/5, HI 4/5.  Left upper extremity: Shoulder abduction, elbow flexion 4+ to 5/5, elbow extension 4-/5, wrist extension 3-/5, Triceps 2, finger grip 3-/5 Left lower extremity--- trace ADF, 1/5 APF.  Right lower extremity: HF and KE  2- to 2+/5, ADF/PF  2/5, wiggles toes Skin:  Neck incision clean and dry   Psychiatric: Her speech is normal. Thought content normal. Her mood is appropriate  Assessment/Plan: 1. 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 Physiatrist is providing close team supervision and 24 hour management of active medical problems listed below. Physiatrist and rehab team continue to assess barriers to discharge/monitor patient progress toward functional and medical goals.  Function:  Bathing Bathing position Bathing activity did not occur: Refused Position: Systems developer parts bathed by patient: Right arm, Left arm, Chest, Abdomen, Front perineal area, Right upper leg, Left upper leg Body parts bathed by helper: Buttocks, Right lower leg, Left lower leg, Back  Bathing assist Assist Level: Touching or steadying assistance(Pt > 75%)      Upper Body Dressing/Undressing Upper body dressing Upper body dressing/undressing activity did not occur: Refused What is the patient wearing?: Pull over shirt/dress     Pull over shirt/dress - Perfomed by patient: Thread/unthread right sleeve, Thread/unthread left sleeve Pull over shirt/dress - Perfomed by helper: Put head through opening, Pull shirt over trunk        Upper body assist Assist Level: Supervision or verbal cues      Lower Body Dressing/Undressing Lower body dressing   What is the patient wearing?: Pants, Ted Hose, Shoes     Pants- Performed by patient: Thread/unthread right pants leg, Thread/unthread left pants leg Pants- Performed by helper: Pull pants up/down   Non-skid slipper socks- Performed by helper: Don/doff right sock, Don/doff left sock       Shoes - Performed by helper: Don/doff right shoe, Don/doff left shoe, Fasten right, Fasten left       TED Hose - Performed by helper: Don/doff right TED hose,  Don/doff left TED hose  Lower body assist Assist for lower body dressing: Touching or steadying assistance (Pt > 75%)      Toileting Toileting Toileting activity did not occur: No continent bowel/bladder event   Toileting steps completed by helper:  Adjust clothing prior to toileting, Performs perineal hygiene, Adjust clothing after toileting    Toileting assist Assist level: Two helpers   Transfers Chair/bed transfer Chair/bed transfer activity did not occur: Safety/medical concerns Chair/bed transfer method: Lateral scoot Chair/bed transfer assist level: Moderate assist (Pt 50 - 74%/lift or lower) Chair/bed transfer assistive device: Sliding board, Armrests     Locomotion Ambulation Ambulation activity did not occur: Safety/medical concerns         Wheelchair Wheelchair activity did not occur: Safety/medical concerns Type: Manual Max wheelchair distance: 150 ft Assist Level: Dependent (Pt equals 0%) (pt c/o L shoulder pain with propelling w/c)  Cognition Comprehension Comprehension assist level: Follows complex conversation/direction with no assist  Expression Expression assist level: Expresses complex ideas: With no assist  Social Interaction Social Interaction assist level: Interacts appropriately with others - No medications needed.  Problem Solving Problem solving assist level: Solves complex problems: Recognizes & self-corrects  Memory Memory assist level: Complete Independence: No helper   Medical Problem List and Plan: 1. 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 Status post ACDF C4-5, 5-6.   -continue therapies  -husband quite engaged.   -pt showing daily progress  -F/u MRI given Lhermitte's sign (worsening) ----???Collar 2. DVT Prophylaxis/Anticoagulation: SCDs.    vascular study negative -initiated sq lovenox 30 q12---12 week duration post injury 3. Pain Management: overall pain improved  -Baclofen 15mg  3 times a day. Valium qhs prn  -Lyrica 150 mg daily at bedtime and 75mg  qam  -Cymbalta 30 mg daily.   -Hydrocodone and Robaxin as needed.  -increased pamelor to 20mg  qhs 4. Mood: team to provide ego support,     -anxiety with some improvement. Has big expectations of herself/recovery  -neuropsych consult at some point 5. Neuropsych: This patient is capable of making decisions on her own behalf. 6. Skin/Wound Care: Routine skin checks  -decubitus precautions/frequent turning 7. Fluids/Electrolytes/Nutrition:  8. Neurogenic bowel and bladder.    -Have Encouraged increased fluid intake in am/less in pm to decrease overnight cath needs.   -AM soft/lax with PM suppository   -husband involved with bowel/bladder education. Able to perform caths now 9. Leukocytosis due to steroids.  -decreased steroids to 4mg  q12 ---decreased  To 2mg  on 5/29 -   -WBC decreasing from 17K to 12 K on 5/27 10. Burning with urination- recurrent. Still on diflucan  -recheck ucx 11. Candidiasis:   Oral and vaginal started Diflucan---continue, weaning steroids  LOS (Days) 20 A FACE TO FACE EVALUATION WAS PERFORMED  Selam Pietsch T 11/07/2015 8:36 AM

## 2015-11-07 NOTE — Plan of Care (Signed)
Problem: RH Ambulation Goal: LTG Patient will ambulate in controlled environment (PT) LTG: Patient will ambulate in a controlled environment, # of feet with assistance (PT).  D/c due to slow progress ABG  Problem: RH Wheelchair Mobility Goal: LTG Patient will propel w/c in community environment (PT) LTG: Patient will propel wheelchair in community environment, # of feet with assist (PT)  D/c

## 2015-11-07 NOTE — Progress Notes (Signed)
Patient this AM again with c/o burning at vagina, pt wondering if discomfort could be caused from betadine swabs prior to I&O cath. Pt states she mentioned this to dayshift RN 5/31 and to MD with a response med doses may be changed.  No new orders at this time.  Dayshift RN aware, will continue to monitor.

## 2015-11-08 ENCOUNTER — Inpatient Hospital Stay (HOSPITAL_COMMUNITY): Payer: BLUE CROSS/BLUE SHIELD | Admitting: Occupational Therapy

## 2015-11-08 ENCOUNTER — Inpatient Hospital Stay (HOSPITAL_COMMUNITY): Payer: BLUE CROSS/BLUE SHIELD

## 2015-11-08 LAB — URINE CULTURE: CULTURE: NO GROWTH

## 2015-11-08 NOTE — Progress Notes (Addendum)
Patient bladder scanned prior to catheterization for 175 ml. Catheterized by husband with clean and appropriate technique for 200 ml of dark, concentrated orange urine with minimal sediment and blood clots noted. Patient remains on pyridium. Post-cath scan 0 ml. Patient states she continues to feel some "burning-like" sensation in her bladder area. Patient states she had urgency and frequency prior to admission on a daily basis.   Patient able to bear down and push catheter from urethra, as well as void around catheter. Patient has high hopes of being able to void on her own, without use of catheterization.

## 2015-11-08 NOTE — Plan of Care (Signed)
Problem: RH Bed to Chair Transfers Goal: LTG Patient will perform bed/chair transfers w/assist (PT) LTG: Patient will perform bed/chair transfers with assistance, with/without cues (PT).  Downgraded due to progress

## 2015-11-08 NOTE — Progress Notes (Signed)
Patient's husband able to catheterize patient without difficulty. Patient stated she stopped oral intake of fluids at dinner time to prevent night time catheterization. Importance of adequate hydration discussed. Patient's volumes are within normal limits to below normal limits for AM catheterization.  Patient and husband both very frustrated and emotional this evening. Patient states she feels like she is "getting no real answers. Just different answers." Both patient and patient's husband concerned about how they will manage financially and otherwise post discharge from rehab. Would like to continue rehab stay in hopes of more improvement.

## 2015-11-08 NOTE — Progress Notes (Signed)
Occupational Therapy Session Note  Patient Details  Name: Willy EddyBrenda Morford MRN: [id number] Date of Birth: 07-13-1975  Today's Date: 11/08/2015 OT Individual Time: 1300-1400 OT Individual Time Calculation (min): 60 min    Short Term Goals: Week 4:  OT Short Term Goal 1 (Week 4): STG=LTG due to LOS  Skilled Therapeutic Interventions/Progress Updates:    Pt seen for ADL activity: shower session. Pt was sitting up in bed upon arrival and agreeable to OT. Maximove used for all transfers  Pt rolled in bed with Max A to place transfer pad. Pt. showered in rolling shower chair with gait belt around waist which allowed her to display good dynamic sitting balance. Pt washed upper body with min A and required min A to wash hair. OT washed lower body. OT educated on bathing at the sink, using the dish sprayer at home since there is no shower on the first floor.Pt. Required total A for dressing due to time. Pt was left in bed with nurse present and call bell present.   Therapy Documentation Precautions:  Precautions Precautions: Cervical Precaution Comments: orthostatic BPs Restrictions Weight Bearing Restrictions: No  Pain:  No pain indicated      Therapy/Group: Individual Therapy  Malachi BondsHannah Lexiana Spindel 11/08/2015, 3:09 PM

## 2015-11-08 NOTE — Progress Notes (Addendum)
Occupational Therapy Weekly Progress Note  Patient Details  Name: Jasmine [name] MRN: [id number] Date of Birth: 04-13-1976  Beginning of progress report period: Nov 01, 2015 End of progress report period: November 08, 2015  Today's Date: 11/08/2015 OT Individual Time: 1045-1200 OT Individual Time Calculation (min): 75 min    Patient has met 1 of 3 short term goals.  Pt making steady progress towards OT goals. She cont to require increased assist with all aspects of toileting due to impaired balance and anxiousness regarding decreased balance. Recommending pt wear long gown during toileting task in order to ease clothing management aspect of task. She requires min A with rolling using leg loops, able to turn once she can reach to use bed rail function.  Education and hands on assist being completed by pt's husband. Pt remains motivated for therapy, however, realization of current function level and need for assist as d/c is approaching. Cont to provide encouragement and education.   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  Patient progressing toward long term goals..  Continue plan of care.  OT Short Term Goals Week 3:  OT Short Term Goal 1 (Week 3): Pt will complete toileting task with total A of 1 in order to decrease caregiver burden.  OT Short Term Goal 1 - Progress (Week 3): Not met OT Short Term Goal 2 (Week 3): Pt will roll in bed with supervision using leg loops in prep for functional task OT Short Term Goal 2 - Progress (Week 3): Not met OT Short Term Goal 3 (Week 3): Pt will sit EOB to complete grooming or UB bathing task with close supervision in order to increase functional sitting balance.  OT Short Term Goal 3 - Progress (Week 3): Met Week 4:   OT Short Term Goal 1 (Week 4): STG=LTG due to LOS  Skilled Therapeutic Interventions/Progress Updates:    Pt seen for OT tx session focusing on neuro re-ed, activity tolerance, and fine motor coordination. Pt received in room sitting up in w/c. Pt tearful regarding news she had received from MD regarding MRI results. Emotional support provided. Pt desiring to work on [place] in gym "to get my legs working".  Completed 10 min on NuStep level 4 using B LEs and L UE. Completed peg activity, working on grasping medium size pegs and placing into board. Able to use tenodesis grasp to manipulate pegs and place into board. Pt frustrated regarding lack of movement in fingers without use of tenodesis. Education and support provided and encouraged her to cont to use and strengthen the grasp she does have.  Worked on pipe tree task working to incorporate B UEs into functional task, using L UE at stabilizer level. Pt returned to room at end of session, left sitting up in w/c set-up with meal tray.   Therapy Documentation Precautions:  Precautions Precautions: Cervical Precaution Comments: orthostatic BPs Restrictions Weight Bearing Restrictions: No Pain: Pain Assessment Pain Assessment: No/denies pain Pain Score: 0-No pain  See Function Navigator for Current Functional Status.   Therapy/Group: Individual Therapy  Lewis, Jaece Ducharme C 11/08/2015, 12:40 PM

## 2015-11-08 NOTE — Progress Notes (Signed)
[place] PHYSICAL MEDICINE & REHABILITATION     PROGRESS NOTE    Subjective/Complaints: Up with therapy transferring out of bed. Had questions about MRI as she was "half asleep" when surgeon visited her. Still having burning pain in neck/arms/legs    ROS: Denies CP, SOB, nausea, vomiting, diarrhea.   Objective: Vital Signs: Blood pressure 119/75, pulse 84, temperature 98.1 F (36.7 C), temperature source Oral, resp. rate 18, weight 69.99 kg (154 lb 4.8 oz), last menstrual period 09/19/2015, SpO2 95 %. Mr Cervical Spine Wo Contrast  11/07/2015  CLINICAL DATA:  40 year old female status post fall from ladder 2 months ago. Cervical spine surgery and cervical spinal cord abnormality. Complaining of Lhermitte's sign. Initial encounter. EXAM: MRI CERVICAL SPINE WITHOUT CONTRAST TECHNIQUE: Multiplanar, multisequence MR imaging of the cervical spine was performed. No intravenous contrast was administered. COMPARISON:  Cervical spine MRIs 10/11/2015, 10/10/2015, 09/19/2015. FINDINGS: Alignment: Stable with mild straightening of lordosis. Sequelae of C4-C5 and C5-C6 ACDF re- demonstrated with mild susceptibility artifact. Vertebrae: No marrow edema or evidence of acute osseous abnormality. Incidental right T1 vertebral body benign hemangioma. Cord: Continued and slightly more discrete patchy and nodular abnormal T2 and STIR hyperintensity in the spinal cord centered at C5-C6. The right hemi cord is mildly worst affected (series 5, image 6). The signal abnormality has progressed since 09/19/2015. No associated cord expansion. Mild if any associated cord volume loss at this time. No blood products identified within the cord. Above C5 and below C6 visible spinal cord signal and morphology is normal. Cervicomedullary junction is within normal limits. Posterior Fossa, vertebral arteries, paraspinal tissues: Resolved prevertebral soft tissue fluid and edema since 10/11/2015. Negative paraspinal soft tissues  today. Negative visualized posterior fossa and cerebral structures. Disc levels: Unchanged since 10/11/2015. Mild multifactorial spinal stenosis at C6-C7 related to disc osteophyte complex with broad-based posterior component of disc and ligament flavum hypertrophy (series 5, image 7 and series 9, image 19). No significant upper thoracic degenerative changes. IMPRESSION: 1. Continued and mildly progressed abnormal spinal cord signal at C5-C6 since April most compatible with developing myelomalacia. 2. Resolved prevertebral and paraspinal postoperative soft tissue edema since 10/11/2015. 3. Stable cervical vertebrae with ACDF changes C4-C5 and C5-C6. 4. Stable mild multifactorial degenerative spinal stenosis at C6-C7. Electronically Signed   By: Odessa FlemingH  Hall M.D.   On: 11/07/2015 09:08   No results for input(s): WBC, HGB, HCT, PLT in the last 72 hours. No results for input(s): NA, K, CL, GLUCOSE, BUN, CREATININE, CALCIUM in the last 72 hours.  Invalid input(s): CO CBG (last 3)  No results for input(s): GLUCAP in the last 72 hours.  Wt Readings from Last 3 Encounters:  11/08/15 69.99 kg (154 lb 4.8 oz)  10/11/15 68.6 kg (151 lb 3.8 oz)  09/18/15 64.411 kg (142 lb)    Physical Exam:  Constitutional: She appears well-developed and well-nourished. NAD. Head: Normocephalic. Atraumatic  Eyes: Conjunctivae and EOM are normal.  Cardiovascular: Normal rate and regular rhythm.  Respiratory: Effort normal and breath sounds normal. No respiratory distress.  GI: Soft. Bowel sounds are normal. She exhibits no distension.  Musculoskeletal: She exhibits mild edema and tenderness.  Neurological: She is alert and oriented.  Sensation intact LT  BLE Motor: Right upper extremity: delt 5/5, bicep 5/5, tricep 4/5, wrist 4/5, HI 4/5.  Left upper extremity: Shoulder abduction, elbow flexion 4+ to 5/5, elbow extension 4-/5, wrist extension 3-/5, Triceps 2, finger grip 3-/5 Left lower extremity--- trace ADF, 1/5  APF.  Right lower extremity:  HF and KE  2- to 2+/5, ADF/PF  2/5, wiggles toes Skin:  Neck incision clean and dry   Psychiatric: Her speech is normal. Thought content normal. Her mood is appropriate  Assessment/Plan: 1. 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 Physiatrist is providing close team supervision and 24 hour management of active medical problems listed below. Physiatrist and rehab team continue to assess barriers to discharge/monitor patient progress toward functional and medical goals.  Function:  Bathing Bathing position Bathing activity did not occur: Refused Position: Systems developer parts bathed by patient: Right arm, Left arm, Chest, Abdomen, Front perineal area, Right upper leg, Left upper leg Body parts bathed by helper: Buttocks, Right lower leg, Left lower leg, Back  Bathing assist Assist Level: Touching or steadying assistance(Pt > 75%)      Upper Body Dressing/Undressing Upper body dressing Upper body dressing/undressing activity did not occur: Refused What is the patient wearing?: Pull over shirt/dress     Pull over shirt/dress - Perfomed by patient: Thread/unthread right sleeve, Thread/unthread left sleeve Pull over shirt/dress - Perfomed by helper: Put head through opening, Pull shirt over trunk        Upper body assist Assist Level: Supervision or verbal cues      Lower Body Dressing/Undressing Lower body dressing   What is the patient wearing?: Pants, Ted Hose, Shoes     Pants- Performed by patient: Thread/unthread right pants leg, Thread/unthread left pants leg Pants- Performed by helper: Pull pants up/down   Non-skid slipper socks- Performed by helper: Don/doff right sock, Don/doff left sock       Shoes - Performed by helper: Don/doff right shoe, Don/doff left shoe, Fasten right, Fasten left       TED  Hose - Performed by helper: Don/doff right TED hose, Don/doff left TED hose  Lower body assist Assist for lower body dressing: Touching or steadying assistance (Pt > 75%)      Toileting Toileting Toileting activity did not occur: No continent bowel/bladder event   Toileting steps completed by helper: Adjust clothing prior to toileting, Performs perineal hygiene, Adjust clothing after toileting    Toileting assist Assist level: Two helpers   Transfers Chair/bed transfer Chair/bed transfer activity did not occur: Safety/medical concerns Chair/bed transfer method: Lateral scoot Chair/bed transfer assist level: Moderate assist (Pt 50 - 74%/lift or lower) Chair/bed transfer assistive device: Sliding board, Armrests     Locomotion Ambulation Ambulation activity did not occur: Safety/medical concerns         Wheelchair Wheelchair activity did not occur: Safety/medical concerns Type: Manual Max wheelchair distance: 150 ft Assist Level: Dependent (Pt equals 0%) (pt c/o L shoulder pain with propelling w/c)  Cognition Comprehension Comprehension assist level: Follows complex conversation/direction with no assist  Expression Expression assist level: Expresses complex ideas: With no assist  Social Interaction Social Interaction assist level: Interacts appropriately with others - No medications needed.  Problem Solving Problem solving assist level: Solves complex problems: Recognizes & self-corrects  Memory Memory assist level: Complete Independence: No helper   Medical Problem List and Plan: 1. 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 Status post ACDF C4-5, 5-6.   -continue therapies  -had long discussion with patient regarding prognosis and expectations.   -F/U MRI with cervical myelomalacia. Op site intact. No cord/root compromise 2. DVT Prophylaxis/Anticoagulation: SCDs.    vascular study  negative -initiated sq lovenox 30 q12---12 week duration post injury 3. Pain Management: overall pain improved  -Baclofen 15mg  3 times a day. Valium qhs prn  -Lyrica 150 mg daily at bedtime and 75mg  qam  -Cymbalta 30 mg daily.   -Hydrocodone and Robaxin as needed.  -increased pamelor to 20mg  qhs  -will add soft cervical collar for comfort 4. Mood: team to provide ego support,    -anxiety with some improvement. Has big expectations of herself/recovery  -neuropsych consult at some point 5. Neuropsych: This patient is capable of making decisions on her own behalf. 6. Skin/Wound Care: Routine skin checks  -decubitus precautions/frequent turning 7. Fluids/Electrolytes/Nutrition:  8. Neurogenic bowel and bladder.    -Have Encouraged increased fluid intake in am/less in pm to decrease overnight cath needs.   -AM soft/lax with PM suppository   -husband involved with bowel/bladder education. Able to perform caths now 9. Leukocytosis due to steroids.  -decadron decreased 2mg  BID on 5/29 -   -WBC decreasing from 17K to 12 K on 5/27 10. Burning with urination- recurrent--continues on diflucan  -follow up ua clear, ucx pending 11. Candidiasis:   Oral and vaginal started Diflucan---continue, weaning steroids  LOS (Days) 21 A FACE TO FACE EVALUATION WAS PERFORMED  SWARTZ,ZACHARY T 11/08/2015 9:44 AM

## 2015-11-08 NOTE — Progress Notes (Signed)
Patient refused to cath at 0500, related to request to be allowed to sleep until 0600. Patient continued to refuse cath at 0600, stating her husband would do it once he woke up. Patient re-educated on bladder program and need to empty bladder.

## 2015-11-08 NOTE — Progress Notes (Signed)
Physical Therapy Weekly Progress Note  Patient Details  Name: Jasmine [name] MRN: [id number] Date of Birth: 05/21/1976  Beginning of progress report period: Nov 02, 2015 End of progress report period: November 08, 2015  Today's Date: 11/08/2015 PT Individual Time: 1430-1530 PT Individual Time Calculation (min): 60 min  Session focused on family education with pt's son, Derrick. Demonstrated, educated, and had him return demonstrate bed <> w/c transfers, simulated car transfer to 24" height, bumping w/c up/down 1 step for home entry and community access, and w/c parts management. Pt able to direct care independently for basic transfers and set-up of w/c but min/mod verbal cues from therapist for problem solving simulated car transfer set-up. Planning to perform real car transfer training on Wed or Thurs of next week (pt to talk with her husband and daughter to coordinate). Pt emotional during session and support provided. Educated son on importance of body mechanics for him as well as allowing patient to attempt as much mobility as possible, not just picking her up. Will benefit from continued practice when present during sessions. Husband is to be primary caregiver.   Patient has met 2 of 3 short term goals.  Pt did not meet STG for transfers with mod assist as she still often requires max assist at times. LTG downgraded to overall mod assist. Husband has been active in family education and able to transfer patient safely though encouraging him to allow her to do more of transfers. Planning for d/c end of next week still with mod assist w/c level goals. Husband to be primary caregiver.   Patient continues to demonstrate the following deficits: quadriplegia, decreased balance, impaired tone, decreased strength, decreased postural control, decreased endurance, decreased functional mobility and therefore will continue to benefit from skilled PT intervention to enhance overall performance with activity  tolerance, balance, postural control, ability to compensate for deficits, functional use of  right lower extremity, left upper extremity and left lower extremity and knowledge of precautions.  Patient progressing toward long term goals..  Plan of care revisions: Discharged community w/c goal and therapeutic ambulation goals. . Downgraded transfer goal due to slower progress.   PT Short Term Goals Week 3:  PT Short Term Goal 1 (Week 3): Pt wlll be able to progess transfers to overall mod assist using slideboard on level surfaces PT Short Term Goal 1 - Progress (Week 3): Progressing toward goal PT Short Term Goal 2 (Week 3): Pt will be able to perform car transfer with max assist of 1 PT Short Term Goal 2 - Progress (Week 3): Met PT Short Term Goal 3 (Week 3): Pt's husband with initiate hands-on transfer training PT Short Term Goal 3 - Progress (Week 3): Met Week 4:  PT Short Term Goal 1 (Week 4): = LTGs  Skilled Therapeutic Interventions/Progress Updates:  Balance/vestibular training;Community reintegration;Discharge planning;Functional mobility training;Functional electrical stimulation;DME/adaptive equipment instruction;Neuromuscular re-education;Pain management;Patient/family education;Splinting/orthotics;Psychosocial support;Stair training;Therapeutic Activities;Therapeutic Exercise;UE/LE Coordination activities;UE/LE Strength taining/ROM;Wheelchair propulsion/positioning;Disease management/prevention;Skin care/wound management   Therapy Documentation Precautions:  Precautions Precautions: Cervical Precaution Comments: orthostatic BPs Restrictions Weight Bearing Restrictions: No  Pain: No complaints.  See Function Navigator for Current Functional Status.  Therapy/Group: Individual Therapy  Gray, Alison Brescia  Alison B. Gray, PT, DPT  11/08/2015, 3:46 PM   

## 2015-11-08 NOTE — Progress Notes (Signed)
Physical Therapy Session Note  Patient Details  Name: Jasmine EddyBrenda Sweaney MRN: [id number] Date of Birth: Oct 06, 1975  Today's Date: 11/08/2015 PT Individual Time: 0800-0900 PT Individual Time Calculation (min): 60 min   Short Term Goals: Week 3:  PT Short Term Goal 1 (Week 3): Pt wlll be able to progess transfers to overall mod assist using slideboard on level surfaces PT Short Term Goal 2 (Week 3): Pt will be able to perform car transfer with max assist of 1 PT Short Term Goal 3 (Week 3): Pt's husband with initiate hands-on transfer training  Skilled Therapeutic Interventions/Progress Updates:   D/c planning and education in regards to SCI recovery process as well as how the spinal cord functions. Pt upset after discussion with MD about possible prognosis and emotional support provided. Had discussed recommendation for purchasing an aerobic step with multiple height options to allow for more successful transfers at home (bed, car, furniture, etc). Pt in agreement. W/c propulsion for functional strengthening and coordination of BUE down to therapy gym mod I until fatiguing and then requiring supervision cues for encouragement and technique. Seated neuro re-ed to address motor control in BLE as well as core activation/trunk control and posture. End of session up in w/c with all needs in reach.   Therapy Documentation Precautions:  Precautions Precautions: Cervical Precaution Comments: orthostatic BPs Restrictions Weight Bearing Restrictions: No   Pain: Pain Assessment Pain Assessment: No/denies pain Pain Score: 0-No pain     See Function Navigator for Current Functional Status.   Therapy/Group: Individual Therapy  Karolee StampsGray, Mayana Irigoyen Darrol PokeBrescia  Ezabella Teska B. Keeanna Villafranca, PT, DPT  11/08/2015, 12:19 PM

## 2015-11-09 ENCOUNTER — Inpatient Hospital Stay (HOSPITAL_COMMUNITY): Payer: BLUE CROSS/BLUE SHIELD | Admitting: Physical Therapy

## 2015-11-09 DIAGNOSIS — M4802 Spinal stenosis, cervical region: Secondary | ICD-10-CM

## 2015-11-09 MED ORDER — SENNA 8.6 MG PO TABS
2.0000 | ORAL_TABLET | Freq: Every day | ORAL | Status: DC
  Administered 2015-11-10 – 2015-11-13 (×4): 17.2 mg via ORAL
  Filled 2015-11-09 (×4): qty 2

## 2015-11-09 MED ORDER — DOCUSATE SODIUM 100 MG PO CAPS
100.0000 mg | ORAL_CAPSULE | Freq: Three times a day (TID) | ORAL | Status: DC
  Administered 2015-11-09 – 2015-11-13 (×11): 100 mg via ORAL
  Filled 2015-11-09 (×11): qty 1

## 2015-11-09 NOTE — Progress Notes (Signed)
Patient requested that staff not come in her room from 8-12 today so she can rest. Posted not outside door, informed team members. Call bell in reach, patient says she will call for assistance if needed.

## 2015-11-09 NOTE — Progress Notes (Signed)
Patient able to pass moderate amount of soft, formed stool following suppository. Able to bear down with success.

## 2015-11-09 NOTE — Progress Notes (Signed)
[facility] PHYSICAL MEDICINE & REHABILITATION     PROGRESS NOTE    Subjective/Complaints: Patient seen sitting up in bed this morning with her husband doing range of motion exercises to the lower extremities. She states she slept well overnight.    ROS: Denies CP, SOB, nausea, vomiting, diarrhea.   Objective: Vital Signs: Blood pressure 123/80, pulse 116, temperature 98.4 F (36.9 C), temperature source Oral, resp. rate 18, weight 69.99 kg (154 lb 4.8 oz), last menstrual period 09/19/2015, SpO2 94 %. No results found. No results for input(s): WBC, HGB, HCT, PLT in the last 72 hours. No results for input(s): NA, K, CL, GLUCOSE, BUN, CREATININE, CALCIUM in the last 72 hours.  Invalid input(s): CO CBG (last 3)  No results for input(s): GLUCAP in the last 72 hours.  Wt Readings from Last 3 Encounters:  11/08/15 69.99 kg (154 lb 4.8 oz)  10/11/15 68.6 kg (151 lb 3.8 oz)  09/18/15 64.411 kg (142 lb)    Physical Exam:  Constitutional: She appears well-developed and well-nourished. NAD. Head: Normocephalic. Atraumatic  Eyes: Conjunctivae and EOM are normal.  Cardiovascular: Normal rate and regular rhythm.  Respiratory: Effort normal and breath sounds normal. No respiratory distress.  GI: Soft. Bowel sounds are normal. She exhibits no distension.  Musculoskeletal: She exhibits no edema and tenderness.  Neurological: She is alert and oriented.  Sensation intact LT  BLE Motor: Right upper extremity: delt 5/5, bicep 5/5, tricep 4/5, wrist 4/5, HI 4/5.  Left upper extremity: Shoulder abduction, elbow flexion 4+ to 5/5, elbow extension 4-/5, wrist extension 3-/5, Triceps 2, finger grip 3-/5 Left lower extremity trace ADF, 1/5 APF.  Right lower extremity: HF and KE  2- to 2+/5, ADF/PF  2/5, wiggles toes Skin:  Neck incision clean and dry   Psychiatric: Her speech is normal. Thought content normal. Her mood is appropriate  Assessment/Plan: 1. Spastic tetraplegia secondary to  cervical spinal cord injury which require 3+ hours per day of interdisciplinary therapy in a comprehensive inpatient rehab setting. Physiatrist is providing close team supervision and 24 hour management of active medical problems listed below. Physiatrist and rehab team continue to assess barriers to discharge/monitor patient progress toward functional and medical goals.  Function:  Bathing Bathing position Bathing activity did not occur: Refused Position: Systems developer parts bathed by patient: Right arm, Left arm, Chest, Abdomen, Front perineal area, Right upper leg, Left upper leg Body parts bathed by helper: Buttocks, Right lower leg, Left lower leg, Back  Bathing assist Assist Level: Touching or steadying assistance(Pt > 75%)      Upper Body Dressing/Undressing Upper body dressing Upper body dressing/undressing activity did not occur: Refused What is the patient wearing?: Pull over shirt/dress     Pull over shirt/dress - Perfomed by patient: Thread/unthread right sleeve, Thread/unthread left sleeve Pull over shirt/dress - Perfomed by helper: Put head through opening, Pull shirt over trunk        Upper body assist Assist Level: Supervision or verbal cues      Lower Body Dressing/Undressing Lower body dressing   What is the patient wearing?: Pants, Ted Hose, Shoes     Pants- Performed by patient: Thread/unthread right pants leg, Thread/unthread left pants leg Pants- Performed by helper: Pull pants up/down   Non-skid slipper socks- Performed by helper: Don/doff right sock, Don/doff left sock       Shoes - Performed by helper: Don/doff right shoe, Don/doff left shoe, Fasten right, Fasten left  TED Hose - Performed by helper: Don/doff right TED hose, Don/doff left TED hose  Lower body assist Assist for lower body dressing: Touching or steadying assistance (Pt > 75%)      Toileting Toileting Toileting activity did not occur: No continent bowel/bladder  event   Toileting steps completed by helper: Adjust clothing prior to toileting, Performs perineal hygiene, Adjust clothing after toileting    Toileting assist Assist level: Two helpers   Transfers Chair/bed transfer Chair/bed transfer activity did not occur: Safety/medical concerns Chair/bed transfer method: Lateral scoot Chair/bed transfer assist level: Moderate assist (Pt 50 - 74%/lift or lower) Chair/bed transfer assistive device: Sliding board, Armrests     Locomotion Ambulation Ambulation activity did not occur: Safety/medical concerns         Wheelchair Wheelchair activity did not occur: Safety/medical concerns Type: Manual Max wheelchair distance: 180' Assist Level: Dependent (Pt equals 0%) (pt c/o L shoulder pain with propelling w/c)  Cognition Comprehension Comprehension assist level: Follows complex conversation/direction with no assist  Expression Expression assist level: Expresses complex ideas: With no assist  Social Interaction Social Interaction assist level: Interacts appropriately with others - No medications needed.  Problem Solving Problem solving assist level: Solves complex problems: Recognizes & self-corrects  Memory Memory assist level: Complete Independence: No helper   Medical Problem List and Plan: 1. 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 Status post ACDF C4-5, 5-6.   -continue therapies  -F/U MRI with cervical myelomalacia. Op site intact. No cord/root compromise 2. DVT Prophylaxis/Anticoagulation: SCDs.    vascular study negative -initiated sq lovenox 30 q12---12 week duration post injury 3. Pain Management: overall pain improved  -Baclofen 15mg  3 times a day. Valium qhs prn  -Lyrica 150 mg daily at bedtime and 75mg  qam  -Cymbalta 30 mg daily.   -Hydrocodone and Robaxin as needed.  -increased pamelor to 20mg  qhs 4. Mood: team to provide ego  support,    -anxiety with some improvement. Has big expectations of herself/recovery  -neuropsych consult at some point 5. Neuropsych: This patient is capable of making decisions on her own behalf. 6. Skin/Wound Care: Routine skin checks  -decubitus precautions/frequent turning 7. Fluids/Electrolytes/Nutrition:  8. Neurogenic bowel and bladder.    -Encouraged increased fluid intake in am/less in pm to decrease overnight cath needs.   -AM soft/lax with PM suppository, adjusted on 6/3  -husband involved with bowel/bladder education. Able to perform caths now 9. Leukocytosis due to steroids.  -decadron decreased 2mg  BID on 5/29   -WBC decreasing from 17K to 12 K on 5/27 10. Burning with urination- recurrent--continues on diflucan  -follow up ua clear, ucx pending 11. Candidiasis:   Oral and vaginal started Diflucan---continue, weaning steroids  LOS (Days) 22 A FACE TO FACE EVALUATION WAS PERFORMED  Kyrie Fludd Karis Jubanil Ryszard Socarras 11/09/2015 4:00 PM

## 2015-11-09 NOTE — Progress Notes (Signed)
Physical Therapy Session Note  Patient Details  Name: Jasmine [name] MRN: [id number] Date of Birth: [birth year]  Today's Date: 11/09/2015 PT Individual Time: 1630-1700 PT Individual Time Calculation (min): 30 min   Short Term Goals: Week 4:  PT Short Term Goal 1 (Week 4): = LTGs  Skilled Therapeutic Interventions/Progress Updates:    [facility]   [facility]  SB transfer to nustep with mod-max A due to increased resistance on board from pants. PT provided mod-max cues for ue placement and improve WB through BLE as tolerated. [facility] following endurance training with mod A from PT and min cues for UE placement.   Nustep level 5 with L UE and BLE. X 10 minutes for endurance and strength training. Mod cues from PT for improved use of R LE as tolerated and to keep SPM > 25.   [facility] at end of session.     Therapy Documentation Precautions:  Precautions Precautions: Cervical Precaution Comments: orthostatic BPs Restrictions Weight Bearing Restrictions: No General:   Vital Signs: Therapy Vitals Temp: 98.4 F (36.9 C) Temp Source: Oral Pulse Rate: (!) 116 Resp: 18 BP: 123/80 mmHg Patient Position (if appropriate): Sitting Oxygen Therapy SpO2: 94 % O2 Device: Not Delivered   See Function Navigator for Current Functional Status.   Therapy/Group: Individual Therapy  Golden Popustin E Rondalyn Belford 11/09/2015, 5:01 PM

## 2015-11-10 ENCOUNTER — Inpatient Hospital Stay (HOSPITAL_COMMUNITY): Payer: BLUE CROSS/BLUE SHIELD

## 2015-11-10 ENCOUNTER — Inpatient Hospital Stay (HOSPITAL_COMMUNITY): Payer: BLUE CROSS/BLUE SHIELD | Admitting: Occupational Therapy

## 2015-11-10 ENCOUNTER — Inpatient Hospital Stay (HOSPITAL_COMMUNITY): Payer: BLUE CROSS/BLUE SHIELD | Admitting: Physical Therapy

## 2015-11-10 DIAGNOSIS — R058 Other specified cough: Secondary | ICD-10-CM | POA: Insufficient documentation

## 2015-11-10 DIAGNOSIS — K2100 Gastro-esophageal reflux disease with esophagitis, without bleeding: Secondary | ICD-10-CM | POA: Insufficient documentation

## 2015-11-10 DIAGNOSIS — R05 Cough: Secondary | ICD-10-CM

## 2015-11-10 MED ORDER — PANTOPRAZOLE SODIUM 40 MG PO TBEC
40.0000 mg | DELAYED_RELEASE_TABLET | Freq: Every day | ORAL | Status: DC
  Administered 2015-11-10 – 2015-11-15 (×6): 40 mg via ORAL
  Filled 2015-11-10 (×6): qty 1

## 2015-11-10 NOTE — Progress Notes (Signed)
Occupational Therapy Session Note  Patient Details  Name: Jasmine EddyBrenda Gervais MRN: [id number] Date of Birth: 09/22/1975  Today's Date: 11/10/2015 OT Individual Time: 1115-1200 OT Individual Time Calculation (min): 45 min    Short Term Goals: Week 4:  OT Short Term Goal 1 (Week 4): STG=LTG due to LOS  Skilled Therapeutic Interventions/Progress Updates:    Pt seen for OT session focusing on LB dressing, mobility, and transfers. Pt in supine upon arrival, agreeable to tx session.  Focus on pt managing B LE to place in position to don shoes. Pt able to mange LEs to place ankle over thigh to access foot and place shoe With min steadying assist and VCs for technique/ problem solving. Pt able to tie shoes as well from this position. Completed on B sides. Leg loops donned total A. Using leg loops she was able to manage B LEs off EOB, requiring min A for rotating hips with use of chuck pad and min A to bring trunk upright. Sliding board transfer completed into w/c. Pt left sitting in w/c at end of session, set-up in prep for lunch and all needs in reach.  Educated regarding OT goals, increasing independence with self care, bowel/ bladder program, clothing to consider for easier toileting options, and d/c planning.   Therapy Documentation Precautions:  Precautions Precautions: Cervical Precaution Comments: orthostatic BPs Restrictions Weight Bearing Restrictions: No Pain:   No/ denies pain  See Function Navigator for Current Functional Status.   Therapy/Group: Individual Therapy  Lewis, Capri Veals C 11/10/2015, 6:51 AM

## 2015-11-10 NOTE — Progress Notes (Signed)
Jasmine [name] PHYSICAL MEDICINE & REHABILITATION     PROGRESS NOTE    Subjective/Complaints: Pt sitting up in bed this AM after having just woken up.  She states she did not sleep well due to burning bladder. She slept well overnight.  She notes a productive cough. Nursing also notes GERD.     ROS: +Burning bladder, cough. Denies CP, SOB, nausea, vomiting, diarrhea.   Objective: Vital Signs: Blood pressure 117/78, pulse 112, temperature 98.4 F (36.9 C), temperature source Oral, resp. rate 18, weight 69.99 kg (154 lb 4.8 oz), last menstrual period 09/19/2015, SpO2 93 %. No results found. No results for input(s): WBC, HGB, HCT, PLT in the last 72 hours. No results for input(s): NA, K, CL, GLUCOSE, BUN, CREATININE, CALCIUM in the last 72 hours.  Invalid input(s): CO CBG (last 3)  No results for input(s): GLUCAP in the last 72 hours.  Wt Readings from Last 3 Encounters:  11/08/15 69.99 kg (154 lb 4.8 oz)  10/11/15 68.6 kg (151 lb 3.8 oz)  09/18/15 64.411 kg (142 lb)    Physical Exam:  Constitutional: She appears well-developed and well-nourished. NAD. Head: Normocephalic. Atraumatic  Eyes: Conjunctivae and EOM are normal.  Cardiovascular: Tachycardia and regular rhythm.  Respiratory: Effort normal and breath sounds normal. No respiratory distress.  GI: Soft. Bowel sounds are normal. She exhibits no distension.  Musculoskeletal: She exhibits no edema and tenderness.  Neurological: She is alert and oriented.  Sensation intact LT  BLE Motor: Right upper extremity: delt 5/5, bicep 5/5, tricep 4/5, wrist 4/5, HI 4/5.  Left upper extremity: Shoulder abduction, elbow flexion 4+ to 5/5, elbow extension 4-/5, wrist extension 3-/5, Triceps 2, finger grip 3-/5 Left lower extremity trace ADF, 1/5 APF.  Right lower extremity: HF and KE  2- to 2+/5, ADF/PF  2/5, wiggles toes Skin:  Neck incision clean and dry   Psychiatric: Her speech is normal. Thought content normal. Her mood is  appropriate  Assessment/Plan: 1. 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 Physiatrist is providing close team supervision and 24 hour management of active medical problems listed below. Physiatrist and rehab team continue to assess barriers to discharge/monitor patient progress toward functional and medical goals.  Function:  Bathing Bathing position Bathing activity did not occur: Refused Position: Systems developer parts bathed by patient: Right arm, Left arm, Chest, Abdomen, Front perineal area, Right upper leg, Left upper leg Body parts bathed by helper: Buttocks, Right lower leg, Left lower leg, Back  Bathing assist Assist Level: Touching or steadying assistance(Pt > 75%)      Upper Body Dressing/Undressing Upper body dressing Upper body dressing/undressing activity did not occur: Refused What is the patient wearing?: Pull over shirt/dress     Pull over shirt/dress - Perfomed by patient: Thread/unthread right sleeve, Thread/unthread left sleeve Pull over shirt/dress - Perfomed by helper: Put head through opening, Pull shirt over trunk        Upper body assist Assist Level: Supervision or verbal cues      Lower Body Dressing/Undressing Lower body dressing   What is the patient wearing?: Ted Hose, Shoes     Pants- Performed by patient: Thread/unthread right pants leg, Thread/unthread left pants leg Pants- Performed by helper: Thread/unthread right pants leg, Thread/unthread left pants leg, Pull pants up/down   Non-skid slipper socks- Performed by helper: Don/doff right sock, Don/doff left sock  Shoes - Performed by patient: Don/doff right shoe, Don/doff left shoe, Fasten right, Fasten left Shoes - Performed by helper: Don/doff right shoe, Don/doff left shoe, Fasten right, Fasten left       TED Hose - Performed by helper: Don/doff  right TED hose, Don/doff left TED hose  Lower body assist Assist for lower body dressing: 2 Helpers      Toileting Toileting Toileting activity did not occur: Safety/medical concerns   Toileting steps completed by helper: Adjust clothing prior to toileting, Performs perineal hygiene, Adjust clothing after toileting    Toileting assist Assist level: Two helpers   Transfers Chair/bed transfer Chair/bed transfer activity did not occur: Safety/medical concerns Chair/bed transfer method: Lateral scoot Chair/bed transfer assist level: Moderate assist (Pt 50 - 74%/lift or lower) Chair/bed transfer assistive device: Sliding board, Armrests     Locomotion Ambulation Ambulation activity did not occur: Safety/medical concerns         Wheelchair Wheelchair activity did not occur: Safety/medical concerns Type: Manual Max wheelchair distance: 180' Assist Level: Dependent (Pt equals 0%) (pt c/o L shoulder pain with propelling w/c)  Cognition Comprehension Comprehension assist level: Follows complex conversation/direction with no assist  Expression Expression assist level: Expresses complex ideas: With no assist  Social Interaction Social Interaction assist level: Interacts appropriately with others - No medications needed.  Problem Solving Problem solving assist level: Solves complex problems: Recognizes & self-corrects  Memory Memory assist level: Complete Independence: No helper   Medical Problem List and Plan: 1. 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 Status post ACDF C4-5, 5-6.   -continue therapies  -F/U MRI with cervical myelomalacia. Op site intact. No cord/root compromise 2. DVT Prophylaxis/Anticoagulation: SCDs.    vascular study negative -initiated sq lovenox 30 q12---12 week duration post injury 3. Pain Management: overall pain improved  -Baclofen 15mg  3 times a day. Valium qhs  prn  -Lyrica 150 mg daily at bedtime and 75mg  qam  -Cymbalta 30 mg daily.   -Hydrocodone and Robaxin as needed.  -increased pamelor to 20mg  qhs 4. Mood: team to provide ego support,    -anxiety with some improvement. Has big expectations of herself/recovery  -neuropsych consult at some point 5. Neuropsych: This patient is capable of making decisions on her own behalf. 6. Skin/Wound Care: Routine skin checks  -decubitus precautions/frequent turning 7. Fluids/Electrolytes/Nutrition:  8. Neurogenic bowel and bladder.    -Encouraged increased fluid intake in am/less in pm to decrease overnight cath needs.   -AM soft/lax with PM suppository, adjusted on 6/3  -husband involved with bowel/bladder education. Able to perform caths now 9. Leukocytosis due to steroids.  -decadron decreased 2mg  BID on 5/29   -WBC decreasing from 17K to 12 K on 5/27 10. Burning with urination- recurrent--continues on diflucan  -follow up ua clear, ucx no growth on 6/1 11. Candidiasis:   Oral and vaginal started Diflucan---continue, weaning steroids 12.  GERD  Protonix qHS 13. Productive cough  CXR ordered  Accapella  LOS (Days) 23 A FACE TO FACE EVALUATION WAS PERFORMED  Ankit Karis Jubanil Patel 11/10/2015 2:34 PM

## 2015-11-10 NOTE — Progress Notes (Signed)
Patient requested quiet time to sleep from 8:30 to 11:15 this morning. Was able to prevent interruptions, patient reports resting well.

## 2015-11-10 NOTE — Progress Notes (Signed)
Physical Therapy Session Note  Patient Details  Name: Jasmine [name] MRN: [id number] Date of Birth: 01/08/1976  Today's Date: 11/10/2015 PT Individual Time: 1545-1630 PT Individual Time Calculation (min): 45 min   Short Term Goals: Week 1:  PT Short Term Goal 1 (Week 1): Pt will increase rolling R/L with side rails and mod A.  PT Short Term Goal 1 - Progress (Week 1): Met PT Short Term Goal 2 (Week 1): Pt will increase supine to edge of bed, edge of bed to supine to mod A and verbal cues.  PT Short Term Goal 2 - Progress (Week 1): Not met PT Short Term Goal 3 (Week 1): Pt will increase transfers bed to chair to max A.  PT Short Term Goal 3 - Progress (Week 1): Not met PT Short Term Goal 4 (Week 1): Pt will increase w/c mobility to min A about 50 feet.  PT Short Term Goal 4 - Progress (Week 1): Met PT Short Term Goal 5 (Week 1): Pt will ambulate about 10 feet with +2 max A.  PT Short Term Goal 5 - Progress (Week 1): Not met Week 2:  PT Short Term Goal 1 (Week 2): Pt will be able to perform propped on elbow <> sitting to progress bed mobility with mod assist PT Short Term Goal 1 - Progress (Week 2): Met PT Short Term Goal 2 (Week 2): Pt will be able to tolerate standing with lift equipment such as standing frame for postural control and weightbearing x 10 min PT Short Term Goal 2 - Progress (Week 2): Met PT Short Term Goal 3 (Week 2): Pt will be able to progress slideboard transfers with pt = 30% of transfer PT Short Term Goal 3 - Progress (Week 2): Met PT Short Term Goal 4 (Week 2): Pt will be able to initiate use of leg loops for functional management of BLE PT Short Term Goal 4 - Progress (Week 2): Met Week 3:  PT Short Term Goal 1 (Week 3): Pt wlll be able to progess transfers to overall mod assist using slideboard on level surfaces PT Short Term Goal 1 - Progress (Week 3): Progressing toward goal PT Short Term Goal 2 (Week 3): Pt will be able to perform car transfer with max assist  of 1 PT Short Term Goal 2 - Progress (Week 3): Met PT Short Term Goal 3 (Week 3): Pt's husband with initiate hands-on transfer training PT Short Term Goal 3 - Progress (Week 3): Met Week 4:  PT Short Term Goal 1 (Week 4): = LTGs  Skilled Therapeutic Interventions/Progress Updates:   Pt able to achieve a more optimal sitting posture post L/S lateral flexion stretch and neuro re-ed into this motion. Pt with difficult time taking this motion into more dynamic tasks at this time. Pt would continue to benefit from skilled PT services to increase functional mobility.  Therapy Documentation Precautions:  Precautions Precautions: Cervical Precaution Comments: orthostatic BPs Restrictions Weight Bearing Restrictions: No Vital Signs: Therapy Vitals Temp: 98.4 F (36.9 C) Temp Source: Oral Pulse Rate: (!) 112 Resp: 18 BP: 117/78 mmHg Patient Position (if appropriate): Sitting Oxygen Therapy SpO2: 93 % O2 Device: Not Delivered Pain: Pain Assessment Pain Assessment: 0-10 Pain Score: 2  Pain Location: Back Pain Intervention(s):  (graded activity) Mobility:  Max A with sliding board with cues for sequencing and techniques Other Treatments:  Pt educated on rehab plan, core deficits, vasalva avoidance and use. safety in mobility and progressing mobility. Pt performs RUE reaching  with LUE forced use. LUE forced use in sitting as active rest. L lateral flexor stretch followed by active assist 2x10.Perturbations in sitting with BUE support. Pt performs sitting balance without back support as active rest. Unsupported sitting without UE support 30"x4. Anterior weight shifts with and without UE support 2x10. Prelift off of transfer Partial glute unweighting on mat x5.   See Function Navigator for Current Functional Status.   Therapy/Group: Individual Therapy  Monia Pouch 11/10/2015, 4:10 PM

## 2015-11-10 NOTE — Progress Notes (Signed)
Patient continues to complain of an intense burning sensation in lower abdomen/bladder area. Pain medication given per request. Patient denies need for bladder scan, stating it would "wake (her) husband." Patient encouraged to notify RN if worsening pain occurs.

## 2015-11-10 NOTE — Progress Notes (Signed)
Patient used flutter valve then coughed up green phlegm. Will report to oncoming RN and continue to monitor.

## 2015-11-11 ENCOUNTER — Inpatient Hospital Stay (HOSPITAL_COMMUNITY): Payer: BLUE CROSS/BLUE SHIELD

## 2015-11-11 ENCOUNTER — Inpatient Hospital Stay (HOSPITAL_COMMUNITY): Payer: BLUE CROSS/BLUE SHIELD | Admitting: Occupational Therapy

## 2015-11-11 LAB — BASIC METABOLIC PANEL
ANION GAP: 10 (ref 5–15)
BUN: 17 mg/dL (ref 6–20)
CALCIUM: 9.2 mg/dL (ref 8.9–10.3)
CO2: 28 mmol/L (ref 22–32)
Chloride: 99 mmol/L — ABNORMAL LOW (ref 101–111)
Creatinine, Ser: 0.45 mg/dL (ref 0.44–1.00)
GFR calc Af Amer: >60 mL/min (ref >60)
Glucose, Bld: 89 mg/dL (ref 65–99)
Potassium: 4.1 mmol/L (ref 3.5–5.1)
Sodium: 137 mmol/L (ref 135–145)

## 2015-11-11 LAB — CBC WITH DIFFERENTIAL/PLATELET
BASOS ABS: 0 10*3/uL (ref 0.0–0.1)
BASOS PCT: 0 %
EOS PCT: 1 %
Eosinophils Absolute: 0.1 10*3/uL (ref 0.0–0.7)
HCT: 39 % (ref 36.0–46.0)
Hemoglobin: 12 g/dL (ref 12.0–15.0)
LYMPHS PCT: 24 %
Lymphs Abs: 2.5 10*3/uL (ref 0.7–4.0)
MCH: 27.1 pg (ref 26.0–34.0)
MCHC: 30.8 g/dL (ref 30.0–36.0)
MCV: 88.2 fL (ref 78.0–100.0)
Monocytes Absolute: 0.6 10*3/uL (ref 0.1–1.0)
Monocytes Relative: 6 %
Neutro Abs: 7.4 10*3/uL (ref 1.7–7.7)
Neutrophils Relative %: 70 %
PLATELETS: 226 10*3/uL (ref 150–400)
RBC: 4.42 MIL/uL (ref 3.87–5.11)
RDW: 14.7 % (ref 11.5–15.5)
WBC: 10.6 10*3/uL — AB (ref 4.0–10.5)

## 2015-11-11 MED ORDER — DM-GUAIFENESIN ER 30-600 MG PO TB12
1.0000 | ORAL_TABLET | Freq: Two times a day (BID) | ORAL | Status: DC
  Administered 2015-11-11 – 2015-11-15 (×9): 1 via ORAL
  Filled 2015-11-11 (×9): qty 1

## 2015-11-11 MED ORDER — OXYBUTYNIN CHLORIDE 5 MG PO TABS
5.0000 mg | ORAL_TABLET | Freq: Two times a day (BID) | ORAL | Status: DC
  Administered 2015-11-11 – 2015-11-15 (×9): 5 mg via ORAL
  Filled 2015-11-11 (×9): qty 1

## 2015-11-11 NOTE — Plan of Care (Signed)
Problem: SCI BOWEL ELIMINATION Goal: RH STG MANAGE BOWEL WITH ASSISTANCE STG Manage Bowel with Max Assistance.  In & out cath q6h

## 2015-11-11 NOTE — Progress Notes (Signed)
Regarding in & out cath sched... Patient was cathed by husband @ 2230 & asking if husband could do it around 0700 on 6/5. Stated they both are tired & would like to sleep with minimal interruptions.

## 2015-11-11 NOTE — Progress Notes (Signed)
Physical Therapy Session Note  Patient Details  Name: Jasmine EddyBrenda Schmierer MRN: [id number] Date of Birth: [birth year]  Today's Date: 11/11/2015 PT Individual Time: 1430-1538 PT Individual Time Calculation (min): 68 min   Short Term Goals: Week 4:  PT Short Term Goal 1 (Week 4): = LTGs  Skilled Therapeutic Interventions/Progress Updates:   Session focused on d/c planning and education, functional transfers, neuro re-ed for fine motor activity, and overall endurance/activity tolerance. Discussed options for seating in the house and set-up of the home for accessibility, discussed equipment needs and recommendations and set tentative time for car transfer on Thurs morning. Planned to trial patio furniture transfer in solarium but the furniture was removed from the room so verbally talked through options instead. Pt performed fine motor task seated in w/c without back support for core activation while manipulating and completing a puzzle with larger pieces using BUE with focus on use of LUE as much as possible. End of session returned back to bed with mod assist and positioned with HOB elevated and all needs in reach.   Therapy Documentation Precautions:  Precautions Precautions: Cervical Precaution Comments: orthostatic BPs Restrictions Weight Bearing Restrictions: No  Pain: No complaints. Reports soft collar is helping.   See Function Navigator for Current Functional Status.   Therapy/Group: Individual Therapy  Karolee StampsGray, Henry Demeritt Darrol PokeBrescia  Abdelrahman Nair B. Dalvin Clipper, PT, DPT  11/11/2015, 3:50 PM

## 2015-11-11 NOTE — Plan of Care (Signed)
Problem: RH Car Transfers Goal: LTG Patient will perform car transfers with assist (PT) LTG: Patient will perform car transfers with assistance (PT).  Downgraded for safety ABG  Problem: RH Wheelchair Mobility Goal: LTG Patient will propel w/c in home environment (PT) LTG: Patient will propel wheelchair in home environment, # of feet with assistance (PT).  D/c due to shoulder pain - family planning to assist in household spaces such as carpet (pt to perform mobility on tiled surfaces in home)

## 2015-11-11 NOTE — Progress Notes (Signed)
Physical Therapy Session Note  Patient Details  Name: Jasmine [name] MRN: [id number] Date of Birth: 03-Apr-1976  Today's Date: 11/11/2015 PT Individual Time: 0812-0900 PT Individual Time Calculation (min): 48 min   Short Term Goals: Week 4:  PT Short Term Goal 1 (Week 4): = LTGs  Skilled Therapeutic Interventions/Progress Updates:    Pt and husband meeting with MD, so session started late. Focused on family education, functional transfers, and neuro re-ed using standing frame for weightbearing, postural control re-training, and balance. Education on HEP, options for challenging balance at home, etc and reviewed with both pt and husband. Husband performed bed mobility and transfer OOB with pt directing transfer as needed. Discussed Wed or Thurs doing real car transfer and both to confirm with daughter. Pt able to tolerate 23 min in standing frame working on challenging trunk control, weighshifting, posture, and activation in RLE.   Therapy Documentation Precautions:  Precautions Precautions: Cervical Precaution Comments: orthostatic BPs Restrictions Weight Bearing Restrictions: No General: PT Amount of Missed Time (min): 12 Minutes PT Missed Treatment Reason: Other (Comment) (MD meeting with pt/family) Vital Signs:   Pain: Premedicated for nerve pain.    See Function Navigator for Current Functional Status.   Therapy/Group: Individual Therapy  Karolee StampsGray, Bonne Whack Darrol PokeBrescia  Niurka Benecke B. Kveon Casanas, PT, DPT  11/11/2015, 10:43 AM

## 2015-11-11 NOTE — Progress Notes (Signed)
Occupational Therapy Session Note  Patient Details  Name: Jasmine [name] MRN: [id number] Date of Birth: 1975-06-12  Today's Date: 11/11/2015 OT Individual Time: 1100-1207 and 1300-1400 OT Individual Time Calculation (min): 67 min and 60 min   Short Term Goals: Week 4:  OT Short Term Goal 1 (Week 4): STG=LTG due to LOS  Skilled Therapeutic Interventions/Progress Updates:    Session One: Pt seen for OT session focusing on functional positioning, transfers, and education.  Pt sitting up in w/[name] upon arrival, voicing fatigue from previous PT session, however, agreeable to tx session.  Functional transfers completed throughout session with mod A using sliding board transfer.  Pt placed in long sitting and then circle sitting on therapy mat. Worked on pt managing LEs in prep for dressing tasks, she required use of leg loops to cross ankle over knee. Initially required mod- max steadying assist in long sitting, however, once placed in circle sitting able to maintain dynamic balance with supervision.  While pt in circle sitting position, worked on reaching and gross grasp to hold cup, able to hold and manipulate empty cup. Upgraded to pt able to manipulate 1.5# hand weight, able to manipulate weight in simulation of functional task.  Discussed at length with pt regarding possibility of community outing prior to planned d/[name] at end of week. She initially declined immediately, however, once discussed her plan for attending her daughter's high school graduation shortly after d/[name], she was willing to consider outing.  She was very tearful, emotional support provided. Will cont to follow up and encourage pt to remain active in leisure and activties at d/[name]. Educated throughout to pt and husband regarding HHOT vs OPOT, home exercises, modified bathing/dressing techniques, importance of incorporating L UE into functional tasks, AE, and d/[name] planning.   Session Two: Pt seen for OT session focusing on functional  transfers, w/[name] mobility, and education. Pt sitting up in w/[name] upon arrival with husband present. Pt's husband assisted her with transfer to padded tub bench with cut out in prep for toileting task. Pt's husband able to safely assist pt to and from tub bench via sliding board without need for VCs.  In ADL apartment, practiced kitchen mobility from w/[name] level. Pt using reacher to obtain items from overhead cabinet and to open refrigerator and cabinets. Educated regarding optimum set-up of kitchen for easier access from w/[name] level. She required min cuing for w/[name] management for turning and accessing kitchen items.  Pt returned to room with husband at end of session.   Throughout session discussed follow up therapy, importance of independence with ADLs/IADLs, reducing caregiver burden, and d/[name] planning.   Therapy Documentation Precautions:  Precautions Precautions: Cervical Precaution Comments: orthostatic BPs Restrictions Weight Bearing Restrictions: No Pain: Pain Assessment Pain Assessment: No/denies pain  See Function Navigator for Current Functional Status.   Therapy/Group: Individual Therapy  Jasmine [name], Jasmine [name] 11/11/2015, 7:12 AM

## 2015-11-11 NOTE — Progress Notes (Signed)
Orthopedic Tech Progress Note Patient Details:  Jasmine EddyBrenda Halliday 1975-11-29 [id number]  Ortho Devices Type of Ortho Device: Soft collar Ortho Device/Splint Location: neck Ortho Device/Splint Interventions: Application   Naomee Nowland 11/11/2015, 12:09 PM

## 2015-11-11 NOTE — Progress Notes (Addendum)
[place] PHYSICAL MEDICINE & REHABILITATION     PROGRESS NOTE    Subjective/Complaints: Bladder still "burning" and frequent. Still  she notes a productive cough.  .     ROS:  Sinus congestion, cough. Denies CP, SOB, nausea, vomiting, diarrhea.   Objective: Vital Signs: Blood pressure 122/70, pulse 84, temperature 98.3 F (36.8 C), temperature source Oral, resp. rate 18, weight 69.99 kg (154 lb 4.8 oz), last menstrual period 09/19/2015, SpO2 94 %. Dg Chest Port 1 View  11/10/2015  CLINICAL DATA:  Cough for 2 days. EXAM: PORTABLE CHEST 1 VIEW COMPARISON:  None. FINDINGS: The cardiomediastinal silhouette is unremarkable. There is no evidence of focal airspace disease, pulmonary edema, suspicious pulmonary nodule/mass, pleural effusion, or pneumothorax. No acute bony abnormalities are identified. Lower cervical spine hardware noted. IMPRESSION: No active disease. Electronically Signed   By: Harmon PierJeffrey  Hu M.D.   On: 11/10/2015 18:43    Recent Labs  11/11/15 0654  WBC 10.6*  HGB 12.0  HCT 39.0  PLT 226    Recent Labs  11/11/15 0654  NA 137  K 4.1  CL 99*  GLUCOSE 89  BUN 17  CREATININE 0.45  CALCIUM 9.2   CBG (last 3)  No results for input(s): GLUCAP in the last 72 hours.  Wt Readings from Last 3 Encounters:  11/08/15 69.99 kg (154 lb 4.8 oz)  10/11/15 68.6 kg (151 lb 3.8 oz)  09/18/15 64.411 kg (142 lb)    Physical Exam:  Constitutional: She appears well-developed and well-nourished. NAD. Head: Normocephalic. Atraumatic  Eyes: Conjunctivae and EOM are normal.  Cardiovascular: Tachycardia and regular rhythm.  Respiratory: Effort normal and breath sounds normal. No respiratory distress.  GI: Soft. Bowel sounds are normal. She exhibits no distension.  Musculoskeletal: She exhibits no edema and tenderness.  Neurological: She is alert and oriented.  Sensation intact LT  BLE Motor: Right upper extremity: delt 5/5, bicep 5/5, tricep 4/5, wrist 4/5, HI 4/5.  Left  upper extremity: Shoulder abduction, elbow flexion 4+ to 5/5, elbow extension 4-/5, wrist extension 3-/5, Triceps 2, finger grip 3-/5 Left lower extremity trace ADF, 1/5 APF.  Right lower extremity: HF and KE  2- to 2+/5, ADF/PF  2/5, wiggles toes Skin:  Neck incision clean and dry   Psychiatric: Her speech is normal. Thought content normal. Her mood is appropriate  Assessment/Plan: 1. 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 Physiatrist is providing close team supervision and 24 hour management of active medical problems listed below. Physiatrist and rehab team continue to assess barriers to discharge/monitor patient progress toward functional and medical goals.  Function:  Bathing Bathing position Bathing activity did not occur: Refused Position: Systems developerhower  Bathing parts Body parts bathed by patient: Right arm, Left arm, Chest, Abdomen, Front perineal area, Right upper leg, Left upper leg Body parts bathed by helper: Buttocks, Right lower leg, Left lower leg, Back  Bathing assist Assist Level: Touching or steadying assistance(Pt > 75%)      Upper Body Dressing/Undressing Upper body dressing Upper body dressing/undressing activity did not occur: Refused What is the patient wearing?: Pull over shirt/dress     Pull over shirt/dress - Perfomed by patient: Thread/unthread right sleeve, Thread/unthread left sleeve Pull over shirt/dress - Perfomed by helper: Put head through opening, Pull shirt over trunk        Upper body assist Assist Level: Supervision or verbal cues  Lower Body Dressing/Undressing Lower body dressing   What is the patient wearing?: Ted Hose, Shoes     Pants- Performed by patient: Thread/unthread right pants leg, Thread/unthread left pants leg Pants- Performed by helper: Thread/unthread right pants leg, Thread/unthread left pants leg,  Pull pants up/down   Non-skid slipper socks- Performed by helper: Don/doff right sock, Don/doff left sock     Shoes - Performed by patient: Don/doff right shoe, Don/doff left shoe, Fasten right, Fasten left Shoes - Performed by helper: Don/doff right shoe, Don/doff left shoe, Fasten right, Fasten left       TED Hose - Performed by helper: Don/doff right TED hose, Don/doff left TED hose  Lower body assist Assist for lower body dressing: 2 Designer, multimedia activity did not occur: Safety/medical concerns   Toileting steps completed by helper: Adjust clothing prior to toileting, Performs perineal hygiene, Adjust clothing after toileting    Toileting assist Assist level: Two helpers   Transfers Chair/bed transfer Chair/bed transfer activity did not occur: Safety/medical concerns Chair/bed transfer method: Lateral scoot Chair/bed transfer assist level: Moderate assist (Pt 50 - 74%/lift or lower) Chair/bed transfer assistive device: Sliding board, Armrests     Locomotion Ambulation Ambulation activity did not occur: Safety/medical concerns         Wheelchair Wheelchair activity did not occur: Safety/medical concerns Type: Manual Max wheelchair distance: 180' Assist Level: Dependent (Pt equals 0%) (pt c/o L shoulder pain with propelling w/c)  Cognition Comprehension Comprehension assist level: Follows complex conversation/direction with no assist  Expression Expression assist level: Expresses complex ideas: With no assist  Social Interaction Social Interaction assist level: Interacts appropriately with others - No medications needed.  Problem Solving Problem solving assist level: Solves complex problems: Recognizes & self-corrects  Memory Memory assist level: Complete Independence: No helper   Medical Problem List and Plan: 1. 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 Status post  ACDF C4-5, 5-6.   -continue therapies  -F/U MRI with cervical myelomalacia. Op site intact. No cord/root compromise  -reviewed images personally with patient and husband this morning at length 2. DVT Prophylaxis/Anticoagulation: SCDs.    vascular study negative -initiated sq lovenox 30 q12---12 week duration post injury 3. Pain Management: overall pain improved  -Baclofen  3 times a day. Valium qhs prn  -Lyrica 150 mg daily at bedtime and  qam  -Cymbalta 30 mg daily.   -Hydrocodone and Robaxin as needed.  -increased pamelor to  qhs 4. Mood: team to provide ego support,    -anxiety with some improvement. Has big expectations of herself/recovery  -neuropsych   5. Neuropsych: This patient is capable of making decisions on her own behalf. 6. Skin/Wound Care: Routine skin checks  -decubitus precautions/frequent turning 7. Fluids/Electrolytes/Nutrition:  8. Neurogenic bowel and bladder.    -Encouraged increased fluid intake in am/less in pm to decrease overnight cath needs.   -AM soft/lax with PM suppository, adjusted on 6/3  -husband involved with bowel/bladder education. Able to perform caths now 9. Leukocytosis due to steroids.  -decadron decreased  BID on 5/29   -WBC decreasing from 17K to 12 K on 5/27 10. Burning with urination/frequency?   -follow up ua clear, ucx no growth on 6/1  -diflucan  -add ditropan 11. Candidiasis:   Oral and vaginal started Diflucan---continue, weaning steroids 12.  GERD  Protonix qHS 13. Productive cough---likely sinusitis/URI  CXR negative  Accapella, IS, OOB  -mucinex  LOS (  Days) 24 A FACE TO FACE EVALUATION WAS PERFORMED  SWARTZ,ZACHARY T 11/11/2015 9:16 AM

## 2015-11-12 ENCOUNTER — Inpatient Hospital Stay (HOSPITAL_COMMUNITY): Payer: BLUE CROSS/BLUE SHIELD | Admitting: Occupational Therapy

## 2015-11-12 ENCOUNTER — Inpatient Hospital Stay (HOSPITAL_COMMUNITY): Payer: BLUE CROSS/BLUE SHIELD

## 2015-11-12 NOTE — Progress Notes (Signed)
[place] PHYSICAL MEDICINE & REHABILITATION     PROGRESS NOTE    Subjective/Complaints: Had a better night. Bladder not as active. Feels that left leg is a little weaker. Right fourth/fifth fingers numb.     ROS:  Sinus congestion, cough. Denies CP, SOB, nausea, vomiting, diarrhea.   Objective: Vital Signs: Blood pressure 111/58, pulse 97, temperature 98.4 F (36.9 C), temperature source Oral, resp. rate 17, weight 69.99 kg (154 lb 4.8 oz), last menstrual period 09/19/2015, SpO2 95 %. Dg Chest Port 1 View  11/10/2015  CLINICAL DATA:  Cough for 2 days. EXAM: PORTABLE CHEST 1 VIEW COMPARISON:  None. FINDINGS: The cardiomediastinal silhouette is unremarkable. There is no evidence of focal airspace disease, pulmonary edema, suspicious pulmonary nodule/mass, pleural effusion, or pneumothorax. No acute bony abnormalities are identified. Lower cervical spine hardware noted. IMPRESSION: No active disease. Electronically Signed   By: Harmon Pier M.D.   On: 11/10/2015 18:43    Recent Labs  11/11/15 0654  WBC 10.6*  HGB 12.0  HCT 39.0  PLT 226    Recent Labs  11/11/15 0654  NA 137  K 4.1  CL 99*  GLUCOSE 89  BUN 17  CREATININE 0.45  CALCIUM 9.2   CBG (last 3)  No results for input(s): GLUCAP in the last 72 hours.  Wt Readings from Last 3 Encounters:  11/08/15 69.99 kg (154 lb 4.8 oz)  10/11/15 68.6 kg (151 lb 3.8 oz)  09/18/15 64.411 kg (142 lb)    Physical Exam:  Constitutional: She appears well-developed and well-nourished. NAD. Head: Normocephalic. Atraumatic  Eyes: Conjunctivae and EOM are normal.  Cardiovascular: Tachycardia and regular rhythm.  Respiratory: Effort normal and breath sounds normal. No respiratory distress.  GI: Soft. Bowel sounds are normal. She exhibits no distension.  Musculoskeletal: She exhibits no edema and tenderness.  Neurological: She is alert and oriented.  Sensation intact LT  BLE Motor: Right upper extremity: delt 5/5, bicep 5/5,  tricep 4/5, wrist 4/5, HI 4/5.  Left upper extremity: Shoulder abduction, elbow flexion 4+ to 5/5, elbow extension 4-/5, wrist extension 3-/5, Triceps 2, finger grip 3-/5 Left lower extremity trace ADF, 1/5 APF.  Right lower extremity: HF and KE  2- to 2+/5, ADF/PF  2/5, wiggles toes Skin:  Neck incision clean and dry   Psychiatric: Her speech is normal. Thought content normal. Her mood is appropriate  Assessment/Plan: 1. 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 Physiatrist is providing close team supervision and 24 hour management of active medical problems listed below. Physiatrist and rehab team continue to assess barriers to discharge/monitor patient progress toward functional and medical goals.  Function:  Bathing Bathing position Bathing activity did not occur: Refused Position: Systems developer parts bathed by patient: Right arm, Left arm, Chest, Abdomen, Front perineal area, Right upper leg, Left upper leg Body parts bathed by helper: Buttocks, Right lower leg, Left lower leg, Back  Bathing assist Assist Level: Touching or steadying assistance(Pt > 75%)      Upper Body Dressing/Undressing Upper body dressing Upper body dressing/undressing activity did not occur: Refused What is the patient wearing?: Pull over shirt/dress     Pull over shirt/dress - Perfomed by patient: Thread/unthread right sleeve, Thread/unthread left sleeve Pull over shirt/dress - Perfomed by helper: Put head through opening, Pull shirt over trunk        Upper body assist Assist Level: Supervision  or verbal cues      Lower Body Dressing/Undressing Lower body dressing   What is the patient wearing?: Ted Hose, Shoes     Pants- Performed by patient: Thread/unthread right pants leg, Thread/unthread left pants leg Pants- Performed by helper: Thread/unthread right pants  leg, Thread/unthread left pants leg, Pull pants up/down   Non-skid slipper socks- Performed by helper: Don/doff right sock, Don/doff left sock     Shoes - Performed by patient: Don/doff right shoe, Don/doff left shoe, Fasten right, Fasten left Shoes - Performed by helper: Don/doff right shoe, Don/doff left shoe, Fasten right, Fasten left       TED Hose - Performed by helper: Don/doff right TED hose, Don/doff left TED hose  Lower body assist Assist for lower body dressing: 2 Designer, multimedia activity did not occur: Safety/medical concerns   Toileting steps completed by helper: Adjust clothing prior to toileting, Performs perineal hygiene, Adjust clothing after toileting    Toileting assist Assist level: Two helpers   Transfers Chair/bed transfer Chair/bed transfer activity did not occur: Safety/medical concerns Chair/bed transfer method: Lateral scoot Chair/bed transfer assist level: Moderate assist (Pt 50 - 74%/lift or lower) Chair/bed transfer assistive device: Sliding board, Armrests     Locomotion Ambulation Ambulation activity did not occur: Safety/medical concerns         Wheelchair Wheelchair activity did not occur: Safety/medical concerns Type: Manual Max wheelchair distance: 180' Assist Level: Dependent (Pt equals 0%)  Cognition Comprehension Comprehension assist level: Follows complex conversation/direction with no assist  Expression Expression assist level: Expresses complex ideas: With no assist  Social Interaction Social Interaction assist level: Interacts appropriately with others - No medications needed.  Problem Solving Problem solving assist level: Solves complex problems: Recognizes & self-corrects  Memory Memory assist level: Complete Independence: No helper   Medical Problem List and Plan: 1. 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 Status post ACDF  C4-5, 5-6.   -continue therapies  -F/U MRI with cervical myelomalacia. Op site intact. No cord/root compromise  -cervical collar for comfort/support with therapies  -elbow pad RUE---?ulnar compression at elbow 2. DVT Prophylaxis/Anticoagulation: SCDs.    vascular study negative -initiated sq lovenox 30 q12---12 week duration post injury 3. Pain Management: overall pain improved  -Baclofen 15mg  3 times a day. Valium qhs prn  -Lyrica 150 mg daily at bedtime and 75mg  qam  -Cymbalta 30 mg daily.   -Hydrocodone and Robaxin as needed.  -increased pamelor to 20mg  qhs 4. Mood: team to provide ego support,    -anxiety with some improvement. Has big expectations of herself/recovery  -neuropsych   5. Neuropsych: This patient is capable of making decisions on her own behalf. 6. Skin/Wound Care: Routine skin checks  -decubitus precautions/frequent turning 7. Fluids/Electrolytes/Nutrition:  8. Neurogenic bowel and bladder.    -Encouraged increased fluid intake in am/less in pm to decrease overnight cath needs.   -AM soft/lax with PM suppository, adjusted on 6/3  -husband can perform caths now 9. Leukocytosis due to steroids.  -decadron decreased 2mg  BID on 5/29---continue for now  -WBC decreasing from 17K to 12 K on 5/27 10. Burning with urination/frequency?   -follow up ua clear, ucx no growth on 6/1  -diflucan  -add ditropan 11. Candidiasis:   Oral and vaginal started Diflucan---continue, weaning steroids 12.  GERD  Protonix qHS 13. Productive cough---likely sinusitis/URI  CXR negative  Accapella, IS, OOB  -mucinex  -some improvement today  LOS (Days) 25 A FACE TO FACE EVALUATION WAS PERFORMED  Jasmine [name] T 11/12/2015 9:08 AM

## 2015-11-12 NOTE — Progress Notes (Signed)
Physical Therapy Session Note  Patient Details  Name: Jasmine [name] MRN: [id number] Date of Birth: [birth year]  Today's Date: 11/12/2015 PT Individual Time: 1431-1535 PT Individual Time Calculation (min): 64 min   Short Term Goals: Week 4:  PT Short Term Goal 1 (Week 4): = LTGs  Skilled Therapeutic Interventions/Progress Updates:   Session focused on discussing goal for outing tomorrow, functional transfers with slideboard with mod to max assist (max assist onto Nustep due to uphill), and neuro re-ed on Nustep and standing frame. Nustep x 23 min on level 5 with LUE adaptive splint for BLE and LUE motor control, reciprocal movement pattern retraining, and functional strengthening. In standing frame, addressed postural control, weightshifting, and activation in RLE in standing x 5 min (limited due to time). End of session returned back to bed with mod assist and all needs in reach. Daughter and friend present during session.   Therapy Documentation Precautions:  Precautions Precautions: Cervical Precaution Comments: orthostatic BPs Restrictions Weight Bearing Restrictions: No  Pain:  Denies pain.    See Function Navigator for Current Functional Status.   Therapy/Group: Individual Therapy  Jasmine [name], Jasmine [name], PT, DPT  11/12/2015, 3:41 PM

## 2015-11-12 NOTE — Patient Care Conference (Signed)
Inpatient RehabilitationTeam Conference and Plan of Care Update Date: 11/12/2015   Time: 2:20  PM    Patient Name: Jasmine [name]      Medical Record Number: [id number]  Date of Birth: 02-Apr-1976 Sex: Female         Room/Bed: 4W25C/4W25C-01 Payor Info: Payor: BLUE CROSS BLUE SHIELD / Plan: BCBS OTHER / Product Type: *No Product type* /    Admitting Diagnosis: CERVICAL STENOSIS WITH MYELOPATHY  Admit Date/Time:  10/18/2015  6:46 PM Admission Comments: No comment available   Primary Diagnosis:  Spinal cord injury at C5-C7 level without injury of spinal bone (HCC) Principal Problem: Spinal cord injury at C5-C7 level without injury of spinal bone [facility])  Patient Active Problem List   Diagnosis Date Noted  . Gastroesophageal reflux disease with esophagitis   . Productive cough   . Spinal stenosis in cervical region   . Acute lower UTI   . Adjustment disorder with depressed mood   . Neurogenic bowel 10/21/2015  . Neurogenic bladder 10/21/2015  . 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 Elective surgery   . Postoperative pain   . Respiratory depression   . Leukocytosis   . Generalized OA   . Allodynia   . Spinal cord injury at C5-C7 level without injury of spinal bone (HCC)   . HNP (herniated nucleus pulposus), cervical 10/10/2015  . Fusion of spine of cervical region 10/10/2015  . Acute paraplegia (HCC) 10/10/2015  . Neural foraminal stenosis of cervical spine 09/19/2015  . Cervical radiculopathy due to degenerative joint disease of spine 09/17/2015  . Left shoulder pain 09/16/2015  . Neck pain 09/16/2015  . Right hand weakness 08/06/2015  . Right shoulder pain 04/12/2015  . Insomnia 04/12/2015  . Adjustment disorder with mixed anxiety and depressed mood 03/13/2013  . Chronic lumbar radiculopathy 06/16/2011    Expected Discharge Date: Expected Discharge Date: 11/15/15  Team Members Present: Physician leading conference: Dr. Faith Rogue Social Worker Present: Amada Jupiter,  LCSW Nurse Present: Carmie End, RN PT Present: Karolee Stamps, PT OT Present: Johnsie Cancel, OT SLP Present: Fae Pippin, SLP PPS Coordinator present : Tora Duck, RN, CRRN     Current Status/Progress Goal Weekly Team Focus  Medical   ongoing weakness, pain issues. bladder has been spastic--repsonded to ditropan. having problems dealing with progrnosis  increased functional mobility/activity tolerance  pain/SCI education, coping skills   Bowel/Bladder   bowel program daily at 6 pm, I&O cath schedule every 6 hours, done by husbund most of the time  Managed bowel and bladder program  monitor husbund/s education   Swallow/Nutrition/ Hydration             ADL's   min-mod A LB dressing; set-up UB bathing/dressing; max- total A toileting; mod A sliding board transfers; supervision- min A dynamic balance  Min- mod A overall  Family training; d/c planning; bed mobility for bathing/dressing; education; functional transfers   Mobility   mod assist bed mobility; mod to max assist slideboard transfers; supervision w/c mobility for short distances in controlled setting; total A for standing with lift equipment  mod assist w/c level; discharged home w/c goal due to shoulder pain  neuro re-ed, balance, transfers, endurance, fam education, d/c planning, strengthening   Communication             Safety/Cognition/ Behavioral Observations            Pain   Vicodin prn  < 4 on 0-10 pain scale  monitor pain    Skin  no skin issues,neck incision healed  No additional skin breakdown   monitor skin every shift and change her position as need it    Rehab Goals Patient on target to meet rehab goals: Yes *See Care Plan and progress notes for long and short-term goals.  Barriers to Discharge: severity of injury, awareness of prognosis    Possible Resolutions to Barriers:  ongoing education/ training, orthotics    Discharge Planning/Teaching Needs:  Home with family.  Husband and son planning to adjust  work shifts in order to provide 24/7 care  ongoing   Team Discussion:  Soft collar received and using elbow pads qhs.  Family ed going well and ongoing.On track to meet all goals and planning to go on outing tomorrow.  Pt and husband remain very hopeful about longer term recovery.    Revisions to Treatment Plan:  None   Continued Need for Acute Rehabilitation Level of Care: The patient requires daily medical management by a physician with specialized training in physical medicine and rehabilitation for the following conditions: Daily direction of a multidisciplinary physical rehabilitation program to ensure safe treatment while eliciting the highest outcome that is of practical value to the patient.: Yes Daily medical management of patient stability for increased activity during participation in an intensive rehabilitation regime.: Yes Daily analysis of laboratory values and/or radiology reports with any subsequent need for medication adjustment of medical intervention for : Neurological problems;Post surgical problems  Sibel Khurana 11/12/2015, 3:09 PM

## 2015-11-12 NOTE — Progress Notes (Signed)
Orthopedic Tech Progress Note Patient Details:  Jasmine EddyBrenda [name] November 08, 1975 [id number]  Patient ID: Jasmine EddyBrenda [name], female   DOB: November 08, 1975, 40 y.o.   MRN: [id number] Called in advanced brace order; spoke with Jonelle Sidlehomas  Coraline Talwar 11/12/2015, 9:25 AM

## 2015-11-12 NOTE — Progress Notes (Signed)
Physical Therapy Session Note  Patient Details  Name: Jasmine EddyBrenda Rideout MRN: [id number] Date of Birth: [birth year]  Today's Date: 11/12/2015 PT Individual Time: 0900-1000 PT Individual Time Calculation (min): 60 min   Short Term Goals: Week 4:  PT Short Term Goal 1 (Week 4): = LTGs  Skilled Therapeutic Interventions/Progress Updates:    Session focused on family education with pt and pt's husband, functional bed mobility re-training, and neuro re-ed to address trunk control, balance, BUE and BLE motor control and coordination. Pt able to perform getting to EOB with min assist with facilitation by therapist (lowered HOB part of the way to increase ability to get LLE under her) today. Neuro re-ed on edge of mat while performing kicking with LE's of a ball and tossing/catching without UE support with overall close supervision to steady assist. Propped on elbow to sitting x 3 reps to each side with mod assist to the L and min assist to the R with cues for technique and facilitation at hips/trunk. Husband performing transfers throughout session with cues for improved technique/efficiency.   Therapy Documentation Precautions:  Precautions Precautions: Cervical Precaution Comments: orthostatic BPs Restrictions Weight Bearing Restrictions: No  Pain: Medication given during session.   See Function Navigator for Current Functional Status.   Therapy/Group: Individual Therapy  Karolee StampsGray, Temperence Zenor Darrol PokeBrescia  Dewitte Vannice B. Kyeshia Zinn, PT, DPT  11/12/2015, 12:02 PM

## 2015-11-12 NOTE — Plan of Care (Signed)
OT toileting goals and transfers modified. Toileting goal now set at total A with +1 with pt directing care. Pt's husband able to provide needed assistance for toileting tasks. Modified toilet transfer goal to max A due to difficult nature of toilet transfer. See POC for goal details. Johnsie CancelAmy Lewis, OTR/L

## 2015-11-12 NOTE — Progress Notes (Signed)
Occupational Therapy Session Note  Patient Details  Name: Jasmine [name] MRN: [id number] Date of Birth: 1975-09-12  Today's Date: 11/12/2015 OT Individual Time: 1300-1345 OT Individual Time Calculation (min): 45 min    Short Term Goals: Week 4:  OT Short Term Goal 1 (Week 4): STG=LTG due to LOS  Skilled Therapeutic Interventions/Progress Updates:    Upon entering the room, pt supine in bed with no c/o pain this session. Skilled OT intervention with focus on problem solving for toileting task, toilet transfers, pt directed transfer, and safety. Pt performed supine >sit on EOB with min A with use of bed rail, leg loops, and HOB elevated. Pt directing slide board placement. Slide board transfer from bed > padded BSC with total A. Placement of wheelchair on other side of commode in order for pt to perform lateral leans to L (towards bed) and R (towards wheelchair) . Step placed under feet for comfort and safety during transfer. OT kneeling on step in order to better support pt during lateral leans and assist with clothing management. OT able to manage pt and clothing with total A for task but no second helper needed. Once pants pulled back over B hips, pt transferred on slide board from padded commode to wheelchair. Pt seated in wheelchair with call bell and all needed items within reach. Tray table placed in front of pt.     Therapy Documentation Precautions:  Precautions Precautions: Cervical Precaution Comments: orthostatic BPs Restrictions Weight Bearing Restrictions: No General:   Vital Signs: Therapy Vitals Temp: 98.4 F (36.9 C) Temp Source: Oral Pulse Rate: 80 Resp: 18 BP: 102/68 mmHg Patient Position (if appropriate): Lying Oxygen Therapy SpO2: 98 % O2 Device: Not Delivered  See Function Navigator for Current Functional Status.   Therapy/Group: Individual Therapy  Lowella Gripittman, [name] L 11/12/2015, 2:38 PM

## 2015-11-12 NOTE — Plan of Care (Signed)
Problem: SCI BLADDER ELIMINATION Goal: RH STG MANAGE BLADDER WITH ASSISTANCE STG Manage Bladder With total Assistance  Outcome: Progressing In&Out cath scheduled Q 6 hourly

## 2015-11-12 NOTE — Progress Notes (Signed)
Occupational Therapy Session Note  Patient Details  Name: Jasmine [name] MRN: [id number] Date of Birth: July 08, 1975  Today's Date: 11/12/2015 OT Individual Time: 1045-1200 OT Individual Time Calculation (min): 75 min    Short Term Goals: Week 4:  OT Short Term Goal 1 (Week 4): STG=LTG due to LOS  Skilled Therapeutic Interventions/Progress Updates:    Pt seen for OT session focusing on toilet transfers and toileting task. Pt sitting up in w/c upon arrival, agreeable to tx session with husband present. Session spent problem solving toileting task and transfers with focus on completing task with one person assist. Attempting to eliminate need for lateral leans during toileting task and attempted pt doing sliding board transfer with nightgown donned for barrier btwn butt and board without having to worry about clothing management once at toilet. However, nightgown not long enough to completely cover bottom and with no shorts donned it was difficult to have good grasp on pt to assist with transfer. Attempted to place chuck pad under pt to assist with board transfer, this helped some but would like to cont to problem solve better way.  Once on BSC donned shorts with pt completing lateral leans to B sides, leaning into w/c with steadying assist. Able to get pants majority of way up, however, had difficulty getting back up completely. Ultimately had to use +2 to have pt lean forward while someone from behind pulled pants up.  Pt's husband very active during session assisting therapist and assisting with problem solving. Will cont to work on toileting and reducing caregiver burden. Pt left sitting in w/c with all needs in reach.   Therapy Documentation Precautions:  Precautions Precautions: Cervical Precaution Comments: orthostatic BPs Restrictions Weight Bearing Restrictions: No Pain:   No/ denies pain  See Function Navigator for Current Functional Status.   Therapy/Group: Individual  Therapy  Lewis, Latrisha Coiro C 11/12/2015, 12:12 PM

## 2015-11-13 ENCOUNTER — Inpatient Hospital Stay (HOSPITAL_COMMUNITY): Payer: BLUE CROSS/BLUE SHIELD | Admitting: *Deleted

## 2015-11-13 ENCOUNTER — Inpatient Hospital Stay (HOSPITAL_COMMUNITY): Payer: BLUE CROSS/BLUE SHIELD | Admitting: Physical Therapy

## 2015-11-13 MED ORDER — SENNOSIDES-DOCUSATE SODIUM 8.6-50 MG PO TABS
3.0000 | ORAL_TABLET | Freq: Every day | ORAL | Status: DC
  Administered 2015-11-14 – 2015-11-15 (×2): 3 via ORAL
  Filled 2015-11-13 (×2): qty 3

## 2015-11-13 MED ORDER — POLYETHYLENE GLYCOL 3350 17 G PO PACK: 17.0000 g | PACK | Freq: Every day | ORAL | Status: DC | PRN

## 2015-11-13 NOTE — Progress Notes (Signed)
Physical Therapy Session Note  Patient Details  Name: Jasmine [name] MRN: [id number] Date of Birth: 1975-06-23  Today's Date: 11/13/2015 PT Individual Time: 1601-1700 PT Individual Time Calculation (min): 59 min   Short Term Goals: Week 4:  PT Short Term Goal 1 (Week 4): = LTGs  Skilled Therapeutic Interventions/Progress Updates:    Pt received in bed & agreeable to PT. PT donned pt's leg loops total A & pt transferred supine>sit with use of hospital bed features & Mod A. PT provided total A for sliding board placement but pt able to successfully lean L with hand support on bed rail. Completed sliding board transfer to R with Max A with multiple scoots. Transported pt to gym via w/c total A and utilized standing frame for BLE weight bearing. Pt stood in standing frame x 39 minutes while performing LUE tasks for strengthening & neuro re-ed. Pt sorted objects, placed pegs in holes, played connect four, and reaching for cups to simulate drinking out of cup. At end of session pt left in w/c in care of daughter.   Therapy Documentation Precautions:  Precautions Precautions: Cervical Precaution Comments: orthostatic BPs Restrictions Weight Bearing Restrictions: No  Pain: Pain Assessment Pain Assessment: No/denies pain   See Function Navigator for Current Functional Status.   Therapy/Group: Individual Therapy  Sandi MariscalVictoria M Cara Thaxton 11/13/2015, 2:29 PM

## 2015-11-13 NOTE — Progress Notes (Signed)
[facility]IC[facility] RE[facility]ILIT[facility]     PROGRESS NOTE    Subjective/Complaints: Had a better night. Bladder not as active. Feels that left leg is a little weaker. Right fourth/fifth fingers numb.     ROS:  Sinus congestion, cough. Denies CP, SOB, nausea, vomiting, diarrhea.   Objective: Vital Signs: Blood pressure 110/67, pulse 90, temperature 98 F (36.7 C), temperature source Oral, resp. rate 16, weight 69.99 kg (154 lb 4.8 oz), last menstrual period 09/19/2015, SpO2 95 %. No results found.  Recent Labs  11/11/15 0654  WBC 10.6*  HGB 12.0  HCT 39.0  PLT 226    Recent Labs  11/11/15 0654  NA 137  K 4.1  CL 99*  GLUCOSE 89  BUN 17  CREATININE 0.45  CALCIUM 9.2   CBG (last 3)  No results for input(s): GLUCAP in the last 72 hours.  Wt Readings from Last 3 Encounters:  11/08/15 69.99 kg (154 lb 4.8 oz)  10/11/15 68.6 kg (151 lb 3.8 oz)  09/18/15 64.411 kg (142 lb)    Physical Exam:  Constitutional: She appears well-developed and well-nourished. NAD. Head: Normocephalic. Atraumatic  Eyes: Conjunctivae and EOM are normal.  Cardiovascular: Tachycardia and regular rhythm.  Respiratory: Effort normal and breath sounds normal. No respiratory distress.  GI: Soft. Bowel sounds are normal. She exhibits no distension.  Musculoskeletal: She exhibits no edema and tenderness.  Neurological: She is alert and oriented.  Sensation intact LT  BLE Motor: Right upper extremity: delt 5/5, bicep 5/5, tricep 4/5, wrist 4/5, HI 4/5.  Left upper extremity: Shoulder abduction, elbow flexion 4+ to 5/5, elbow extension 4-/5, wrist extension 3-/5, Triceps 2, finger grip 3-/5 Left lower extremity trace ADF, 1/5 APF.  Right lower extremity: HF and KE  2- to 2+/5, ADF/PF  2/5, wiggles toes Skin:  Neck incision clean and dry   Psychiatric: Her speech is normal. Thought content normal. Her mood is appropriate  Assessment/Plan: 1. 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 Physiatrist is providing close team supervision and 24 hour management of active medical problems listed below. Physiatrist and rehab team continue to assess barriers to discharge/monitor patient progress toward functional and medical goals.  Function:  Bathing Bathing position Bathing activity did not occur: Refused Position: Systems developerhower  Bathing parts Body parts bathed by patient: Right arm, Left arm, Chest, Abdomen, Front perineal area, Right upper leg, Left upper leg Body parts bathed by helper: Buttocks, Right lower leg, Left lower leg, Back  Bathing assist Assist Level: Touching or steadying assistance(Pt > 75%)      Upper Body Dressing/Undressing Upper body dressing Upper body dressing/undressing activity did not occur: Refused What is the patient wearing?: Pull over shirt/dress     Pull over shirt/dress - Perfomed by patient: Thread/unthread right sleeve, Thread/unthread left sleeve Pull over shirt/dress - Perfomed by helper: Put head through opening, Pull shirt over trunk        Upper body assist Assist Level: Supervision or verbal cues      Lower Body Dressing/Undressing Lower body dressing   What is the patient wearing?: Ted Hose, Shoes     Pants- Performed by patient: Thread/unthread right pants leg, Thread/unthread left pants leg Pants- Performed by helper: Thread/unthread right pants leg, Thread/unthread left pants leg, Pull pants up/down   Non-skid slipper socks- Performed by helper: Don/doff right sock, Don/doff left sock     Shoes -  Performed by patient: Don/doff right shoe, Don/doff left shoe, Fasten right, Fasten left Shoes - Performed by helper: Don/doff right shoe, Don/doff left shoe, Fasten right, Fasten left       TED Hose - Performed by helper: Don/doff right TED hose, Don/doff left TED hose  Lower body assist Assist for lower body  dressing: 2 Helpers      Toileting Toileting Toileting activity did not occur: No continent bowel/bladder event   Toileting steps completed by helper: Adjust clothing prior to toileting, Performs perineal hygiene, Adjust clothing after toileting    Toileting assist Assist level:  (total A of 1)   Transfers Chair/bed transfer Chair/bed transfer activity did not occur: Safety/medical concerns Chair/bed transfer method: Lateral scoot Chair/bed transfer assist level: Moderate assist (Pt 50 - 74%/lift or lower) Chair/bed transfer assistive device: Sliding board, Armrests     Locomotion Ambulation Ambulation activity did not occur: Safety/medical concerns         Wheelchair Wheelchair activity did not occur: Safety/medical concerns Type: Manual Max wheelchair distance: 180' Assist Level: Dependent (Pt equals 0%)  Cognition Comprehension Comprehension assist level: Follows complex conversation/direction with no assist  Expression Expression assist level: Expresses complex ideas: With no assist  Social Interaction Social Interaction assist level: Interacts appropriately with others - No medications needed.  Problem Solving Problem solving assist level: Solves complex problems: Recognizes & self-corrects  Memory Memory assist level: Complete Independence: No helper   Medical Problem List and Plan: 1. 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 Status post ACDF C4-5, 5-6.   -continue therapies  -F/U MRI with cervical myelomalacia. Op site intact. No cord/root compromise  -cervical collar for comfort/support with therapies  -elbow pad RUE---?ulnar compression at elbow 2. DVT Prophylaxis/Anticoagulation: SCDs.    vascular study negative -initiated sq lovenox 30 q12---12 week duration post injury 3. Pain Management: overall pain improved  -Baclofen  3 times a day. Valium qhs  prn  -Lyrica 150 mg daily at bedtime and  qam  -Cymbalta 30 mg daily.   -Hydrocodone and Robaxin as needed.  -increased pamelor to  qhs 4. Mood: team to provide ego support,    -anxiety with some improvement. Has big expectations of herself/recovery  -neuropsych   5. Neuropsych: This patient is capable of making decisions on her own behalf. 6. Skin/Wound Care: Routine skin checks  -decubitus precautions/frequent turning 7. Fluids/Electrolytes/Nutrition:  8. Neurogenic bowel and bladder.    -Encouraged increased fluid intake in am/less in pm to decrease overnight cath needs.   -AM soft/lax with PM suppository, place back on senokot-s. Increase to 3 qam.   -prn miralax  -husband can perform caths now 9. Leukocytosis due to steroids.  -decadron decreased  BID on 5/29---wean further tomorrow  -WBC decreasing from 17K to 12 K on 5/27 10. Burning with urination/frequency?   -follow up ua clear, ucx no growth on 6/1  -diflucan  -added ditropan 11. Candidiasis:   Oral and vaginal started Diflucan---continue, weaning steroids 12.  GERD  Protonix qHS 13. Productive cough---likely sinusitis/URI--improving     Accapella, IS, OOB  -mucinex  LOS (Days) 26 A FACE TO FACE EVALUATION WAS PERFORMED  Orva Gwaltney T 11/13/2015 12:52 PM

## 2015-11-13 NOTE — Progress Notes (Signed)
Orthopedic Tech Progress Note Patient Details:  Willy EddyBrenda Grabbe 03-May-1976 [id number] Brace order completed by hanger. Patient ID: Willy EddyBrenda Peschke, female   DOB: 03-May-1976, 40 y.o.   MRN: [id number]   Jennye MoccasinHughes, Collyn Ribas Craig 11/13/2015, 3:18 PM

## 2015-11-13 NOTE — Progress Notes (Signed)
Recreational Therapy Session Note  Patient Details  Name: Jasmine [name] MRN: [id number] Date of Birth: Jun 02, 1976 Today's Date: 11/13/2015  Pain: no c/o Skilled Therapeutic Interventions/Progress Updates: Pt participated in community reintegration/outing to [employer] at overall supervision w/c level.  Goals focused on safe functional mobility on various community surface types, retrieving items for shelving & transporting items, identification & negotiation of obstacles.  Pt's husband present & participatory in outing.  See outing goal sheet in shadow chart for full details.  Therapy/Group: [employer]   Kadin Canipe 11/13/2015, 12:26 PM

## 2015-11-13 NOTE — Progress Notes (Signed)
Recreational Therapy Discharge Summary Patient Details  Name: Jerusalen Mateja MRN: [id number] Date of Birth: January 08, 1976 Today's Date: 11/13/2015  Long term goals set: 2  Long term goals met: 2  Comments on progress toward goals: Pt has made good progress during LOS and is ready for discharge home with family to provide 24 hour assist.  Pt is discharging at set up assist level for simple TR tasks seated w/c level and supervision level for community pursuits on level indoor surfaces.  Education provided on importance of staying active & engages in leisure activities, activity modifications & energy conservation.  Reasons for discharge: discharge from hospital  Patient/family agrees with progress made and goals achieved: Yes  Candas Deemer 11/13/2015, 12:30 PM

## 2015-11-13 NOTE — Progress Notes (Signed)
Occupational Therapy Session Note  Patient Details  Name: Jasmine [name] MRN: [id number] Date of Birth: 1975-12-19  Today's Date: 11/13/2015 OT Individual Time: 1000-1200 OT Individual Time Calculation (min): 120 min    Short Term Goals: Week 4:  OT Short Term Goal 1 (Week 4): STG=LTG due to LOS  Skilled Therapeutic Interventions/Progress Updates:  Pt seen for [employer] community outing focusing on functional mobility and self-advocating for needs in the community. Pt's husband was present for outing. Pt was able to self-propel w/c throughout [employer] and self identify when she needed a rest break. Pt used a basket on her lap to place items in. With VC pt was able to problem solve ways to get w/c to shelves and reach for items to put in her basket. Pt was able to self propel 75 % of the time throughTarget  locating all needed items. OT educated pt and husband on energy conservation throughout session, the importance of problem solving when going out into the community, and how simple community activities can help her get stronger. Pt voiced that the community outing went better than she thought and she feels more prepared to go to her daughters graduation. Pt left in room with call bell in reach.   Therapy Documentation Precautions:  Precautions Precautions: Cervical Precaution Comments: orthostatic BPs Restrictions Weight Bearing Restrictions: No  See Function Navigator for Current Functional Status.   Therapy/Group: Co-Treatment  Malachi BondsHannah Amour Cutrone 11/13/2015, 12:26 PM

## 2015-11-13 NOTE — Progress Notes (Signed)
Social Work Patient ID: Jasmine EddyBrenda [name], female   DOB: 05/12/1976, 40 y.o.   MRN: [id number]  Have reviewed team conf and dc planning issues with pt and spouse as they prepare for end of week d/c.  Both admit they are anxious about d/c and had "hoped I'd be doing more."  I have arranged HH to start and discussed need to transition to OP when ready.  Continue to follow.  Shondell Fabel, LCSW

## 2015-11-13 NOTE — Progress Notes (Addendum)
Physical Therapy Session Note  Patient Details  Name: Jasmine [name] MRN: [id number] Date of Birth: [birth year]  Today's Date: 11/13/2015 PT Individual Time: 0800-0901 PT Individual Time Calculation (min): 61 min   Short Term Goals: Week 3:  PT Short Term Goal 1 (Week 3): Pt wlll be able to progess transfers to overall mod assist using slideboard on level surfaces PT Short Term Goal 1 - Progress (Week 3): Progressing toward goal PT Short Term Goal 2 (Week 3): Pt will be able to perform car transfer with max assist of 1 PT Short Term Goal 2 - Progress (Week 3): Met PT Short Term Goal 3 (Week 3): Pt's husband with initiate hands-on transfer training PT Short Term Goal 3 - Progress (Week 3): Met Week 4:  PT Short Term Goal 1 (Week 4): = LTGs  Skilled Therapeutic Interventions/Progress Updates:    Patient received supine in bed finishing breakfast. SB transfer with mod-max A from PT to [facility] using 4 inch aerobic step.  WC<>nustep SB transfer with max A to nustep and mod A to WC.  Mod-max cues from PT for improved UE positioning and increased use of arms for trunk and pelvic postioning.   Nustep training x 20 minutes on level 5 for reciprocal movement and improved UE/LE endurance training. Min cues to maintain SPM, to 15.   Standing frame x 10 minutes with towel on L knee for improved extension. Weight shift L and x 15 BLE. Reaching L and R to grab 4 cups and pass to contralateral UE.  Patient performed SB transfer to bed with mod A from PT and mod A for bed mobility from sit to supine for BLE management. .  Patient left supine in bed with husband present and all needs met.   Therapy Documentation Precautions:  Precautions Precautions: Cervical Precaution Comments: orthostatic BPs Restrictions Weight Bearing Restrictions: No    Pain: Pain Assessment Pain Assessment: No/denies pain Pain Score: 0-No pain  See Function Navigator for Current Functional Status.   Therapy/Group:  Individual Therapy  Lorie Phenix 11/13/2015, 9:03 AM

## 2015-11-14 ENCOUNTER — Inpatient Hospital Stay (HOSPITAL_COMMUNITY): Payer: BLUE CROSS/BLUE SHIELD

## 2015-11-14 ENCOUNTER — Inpatient Hospital Stay (HOSPITAL_COMMUNITY): Payer: BLUE CROSS/BLUE SHIELD | Admitting: Occupational Therapy

## 2015-11-14 MED ORDER — DEXAMETHASONE 2 MG PO TABS
1.0000 mg | ORAL_TABLET | Freq: Two times a day (BID) | ORAL | Status: DC
  Administered 2015-11-14 – 2015-11-15 (×2): 1 mg via ORAL
  Filled 2015-11-14 (×2): qty 1

## 2015-11-14 MED ORDER — RIVAROXABAN 10 MG PO TABS
10.0000 mg | ORAL_TABLET | Freq: Every day | ORAL | Status: DC
  Administered 2015-11-14: 10 mg via ORAL
  Filled 2015-11-14: qty 1

## 2015-11-14 NOTE — Discharge Summary (Signed)
Discharge summary job [id number]

## 2015-11-14 NOTE — Discharge Instructions (Addendum)
Inpatient Rehab Discharge Instructions  Jasmine [name] Discharge date and time: No discharge date for patient encounter.   Activities/Precautions/ Functional Status: Activity: activity as tolerated Diet: regular diet Wound Care: none needed Functional status:  ___ No restrictions     ___ Walk up steps independently ___ 24/7 supervision/assistance   ___ Walk up steps with assistance ___ Intermittent supervision/assistance  ___ Bathe/dress independently ___ Walk with walker     _x__ Bathe/dress with assistance ___ Walk Independently    ___ Shower independently ___ Walk with assistance    ___ Shower with assistance ___ No alcohol     ___ Return to work/school ________    COMMUNITY REFERRALS UPON DISCHARGE:    Home Health:   PT    OT    RN                     Agency:  [facility]   Phone: [phone]   Medical Equipment/Items Ordered:  Wheelchair, cushion, hospital bed, transfer board                                                      Agency/Supplier:  [facility] @ [phone]  Other:  Catheter supplies - [employer] @ [phone]  [employer] TELEPHONE INTERVIEW ON 6/20 @ 12:45 PM      Special Instructions:  Follow-up Dr. Faith Rogue [address], [place], [place] [phone]. Office to call for appointment  My questions have been answered and I understand these instructions. I will adhere to these goals and the provided educational materials after my discharge from the hospital.  Patient/Caregiver Signature _______________________________ Date __________  Clinician Signature _______________________________________ Date __________  Please bring this form and your medication list with you to all your follow-up doctor's appointments.      Information on my medicine - XARELTO (Rivaroxaban)  This medication education was reviewed with me or my healthcare representative as part of my discharge preparation.   Why was Xarelto  prescribed for you? Xarelto was prescribed for you to reduce the risk of blood clots forming after orthopedic surgery. The medical term for these abnormal blood clots is venous thromboembolism (VTE).  What do you need to know about xarelto ? Take your Xarelto 10 mg ONCE DAILY at the same time every day. You may take it either with or without food.  If you have difficulty swallowing the tablet whole, you may crush it and mix in applesauce just prior to taking your dose.  Take Xarelto exactly as prescribed by your doctor and DO NOT stop taking Xarelto without talking to the doctor who prescribed the medication.  Stopping without other VTE prevention medication to take the place of Xarelto may increase your risk of developing a clot.  After discharge, you should have regular check-up appointments with your healthcare provider that is prescribing your Xarelto.    What do you do if you miss a dose? If you miss a dose, take it as soon as you remember on the same day then continue your regularly scheduled once daily regimen the next day. Do not take two doses of Xarelto on the same day.   Important Safety Information A possible side effect of Xarelto is bleeding. You should call your healthcare provider right away if you experience any of the following: ?  Bleeding from an injury or your nose that does not stop. ? Unusual colored urine (red or dark brown) or unusual colored stools (red or black). ? Unusual bruising for unknown reasons. ? A serious fall or if you hit your head (even if there is no bleeding).  Some medicines may interact with Xarelto and might increase your risk of bleeding while on Xarelto. To help avoid this, consult your healthcare provider or pharmacist prior to using any new prescription or non-prescription medications, including herbals, vitamins, non-steroidal anti-inflammatory drugs (NSAIDs) and supplements.  This website has more information on Xarelto:  [email or web address].

## 2015-11-14 NOTE — Progress Notes (Signed)
[facility]     PROGRESS NOTE    Subjective/Complaints: Realized that bladder is more urgent/burning after she does her bowel program.    ROS:  Sinus congestion, cough. Denies CP, SOB, nausea, vomiting, diarrhea.   Objective: Vital Signs: Blood pressure 111/69, pulse 80, temperature 98.6 F (37 C), temperature source Oral, resp. rate 16, weight 69.99 kg (154 lb 4.8 oz), last menstrual period 09/19/2015, SpO2 96 %. No results found. No results for input(s): WBC, HGB, HCT, PLT in the last 72 hours. No results for input(s): NA, K, CL, GLUCOSE, BUN, CREATININE, CALCIUM in the last 72 hours.  Invalid input(s): CO CBG (last 3)  No results for input(s): GLUCAP in the last 72 hours.  Wt Readings from Last 3 Encounters:  11/08/15 69.99 kg (154 lb 4.8 oz)  10/11/15 68.6 kg (151 lb 3.8 oz)  09/18/15 64.411 kg (142 lb)    Physical Exam:  Constitutional: She appears well-developed and well-nourished. NAD. Head: Normocephalic. Atraumatic  Eyes: Conjunctivae and EOM are normal.  Cardiovascular: Tachycardia and regular rhythm.  Respiratory: Effort normal and breath sounds normal. No respiratory distress.  GI: Soft. Bowel sounds are normal. She exhibits no distension.  Musculoskeletal: She exhibits no edema and tenderness.  Neurological: She is alert and oriented.  Sensation intact LT  BLE Motor: Right upper extremity: delt 5/5, bicep 5/5, tricep 4/5, wrist 4/5, HI 4/5.  Left upper extremity: Shoulder abduction, elbow flexion 4+ to 5/5, elbow extension 4-/5, wrist extension 3-/5, Triceps 2, finger grip 3-/5 Left lower extremity trace ADF, 1/5 APF.  Right lower extremity: HF and KE  2- to 2+/5, ADF/PF  2/5, wiggles toes Skin:  Neck incision clean and dry   Psychiatric: Her speech is normal. Thought content normal. Her mood is appropriate  Assessment/Plan: 1. 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 Physiatrist is providing close team supervision and 24 hour management of active medical problems listed below. Physiatrist and rehab team continue to assess barriers to discharge/monitor patient progress toward functional and medical goals.  Function:  Bathing Bathing position Bathing activity did not occur: Refused Position: Systems developer parts bathed by patient: Right arm, Left arm, Chest, Abdomen, Front perineal area, Right upper leg, Left upper leg Body parts bathed by helper: Buttocks, Right lower leg, Left lower leg, Back  Bathing assist Assist Level: Touching or steadying assistance(Pt > 75%)      Upper Body Dressing/Undressing Upper body dressing Upper body dressing/undressing activity did not occur: Refused What is the patient wearing?: Pull over shirt/dress     Pull over shirt/dress - Perfomed by patient: Thread/unthread right sleeve, Thread/unthread left sleeve Pull over shirt/dress - Perfomed by helper: Put head through opening, Pull shirt over trunk        Upper body assist Assist Level: Supervision or verbal cues      Lower Body Dressing/Undressing Lower body dressing   What is the patient wearing?: Ted Hose, Shoes     Pants- Performed by patient: Thread/unthread right pants leg, Thread/unthread left pants leg Pants- Performed by helper: Thread/unthread right pants leg, Thread/unthread left pants leg, Pull pants up/down   Non-skid slipper socks- Performed by helper: Don/doff right sock, Don/doff left sock     Shoes - Performed by patient: Don/doff right shoe, Don/doff left shoe, Fasten right, Fasten left Shoes - Performed by helper: Don/doff right shoe, Don/doff left shoe,  Fasten right, Fasten left       TED Hose - Performed by helper: Don/doff right TED hose, Don/doff left TED hose  Lower body assist Assist for lower body dressing: 2 Helpers       Toileting Toileting Toileting activity did not occur: No continent bowel/bladder event   Toileting steps completed by helper: Adjust clothing prior to toileting, Performs perineal hygiene, Adjust clothing after toileting    Toileting assist Assist level:  (total A of 1)   Transfers Chair/bed transfer Chair/bed transfer activity did not occur: Safety/medical concerns Chair/bed transfer method: Lateral scoot Chair/bed transfer assist level: Moderate assist (Pt 50 - 74%/lift or lower) Chair/bed transfer assistive device: Sliding board, Armrests, Orthosis     Locomotion Ambulation Ambulation activity did not occur: Safety/medical concerns (quadriplegia)         Wheelchair Wheelchair activity did not occur: Safety/medical concerns Type: Manual Max wheelchair distance: 180' Assist Level: Dependent (Pt equals 0%)  Cognition Comprehension Comprehension assist level: Follows complex conversation/direction with no assist  Expression Expression assist level: Expresses complex ideas: With no assist  Social Interaction Social Interaction assist level: Interacts appropriately with others - No medications needed.  Problem Solving Problem solving assist level: Solves complex problems: Recognizes & self-corrects  Memory Memory assist level: Complete Independence: No helper   Medical Problem List and Plan: 1. 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 Status post ACDF C4-5, 5-6.   -finalize dc planning for tomorrow  -F/U MRI with cervical myelomalacia. Op site intact. No cord/root compromise  -cervical collar for comfort/support with therapies  -elbow pad RUE---?ulnar compression at elbow 2. DVT Prophylaxis/Anticoagulation: SCDs.    vascular study negative -initiated sq lovenox 30 q12---12 week duration post injury---will change to prophylactic xarelto due to patient preference 3. Pain Management:  overall pain improved  -Baclofen 15mg  3 times a day. Valium qhs prn  -Lyrica 150 mg daily at bedtime and 75mg  qam  -Cymbalta 30 mg daily.   -Hydrocodone and Robaxin as needed.  -continue pamelor 20mg  qhs 4. Mood: team to provide ego support,    -anxiety with some improvement. Has big expectations of herself/recovery  -neuropsych   5. Neuropsych: This patient is capable of making decisions on her own behalf. 6. Skin/Wound Care: Routine skin checks  -decubitus precautions/frequent turning 7. Fluids/Electrolytes/Nutrition:  8. Neurogenic bowel and bladder.    -Encouraged increased fluid intake in am/less in pm to decrease overnight cath needs.   -AM soft/lax with PM suppository, place back on senokot-s. Increase to 3 qam.   -prn miralax  -husband can perform caths now 9. Leukocytosis due to steroids.  -decadron decrease to 1mg  bid today and dc after am tomorrow  -WBC decreasing from 17K to 12 K on 5/27 10. Burning with urination/frequency?   -follow up ua clear, ucx no growth on 6/1  -diflucan  -added ditropan 11. Candidiasis:   Oral and vaginal started Diflucan---continue, weaning steroids 12.  GERD  Protonix qHS 13. Productive cough---likely sinusitis/URI--improving     Accapella, IS, OOB  -mucinex  LOS (Days) 27 A FACE TO FACE EVALUATION WAS PERFORMED  SWARTZ,ZACHARY T 11/14/2015 10:17 AM

## 2015-11-14 NOTE — Progress Notes (Signed)
Occupational Therapy Discharge Summary  Patient Details  Name: Jasmine [name] MRN: [id number] Date of Birth: Dec 30, 1975   Patient has met 9 of 9 long term goals due to improved activity tolerance, improved balance, postural control, ability to compensate for deficits, functional use of  RIGHT upper and LEFT upper extremity and improved coordination.  Patient to discharge at overall set-up to max A level.  Patient's care partner is independent to provide the necessary physical assistance at discharge.    Patient's husband present for large majority of tx sessions and has been providing hands on care and assist for ADL tasks.  Bathing/dressing being completed at bed level. Pt with bowel/ bladder program, continent of bowels as part of P.M. Bowel program. Pt's husband assisting her to padded tub bench for toileting needs.   Recommendation:  Patient will benefit from ongoing skilled OT services in home health setting to continue to advance functional skills in the area of BADL and Reduce care partner burden.  Equipment: Pt to order padded tub bench independently for toileting  needs  Reasons for discharge: treatment goals met and discharge from hospital  Patient/family agrees with progress made and goals achieved: Yes  OT Discharge Precautions/Restrictions  Precautions Precautions: Cervical Required Braces or Orthoses: Cervical Brace Cervical Brace: Soft collar;For comfort Restrictions Weight Bearing Restrictions: No Vision/Perception  Vision- History Baseline Vision/History: Wears glasses Wears Glasses: At all times Patient Visual Report: No change from baseline Vision- Assessment Vision Assessment?: No apparent visual deficits  Cognition Overall Cognitive Status: Within Functional Limits for tasks assessed Arousal/Alertness: Awake/alert Orientation Level: Oriented X4 Memory: Appears intact Safety/Judgment: Appears intact Sensation Sensation Light Touch: Impaired  Detail Light Touch Impaired Details: Impaired RUE;Impaired LUE;Impaired RLE;Impaired LLE Proprioception: Impaired Detail Proprioception Impaired Details: Impaired LLE;Impaired RLE;Impaired LUE Coordination Gross Motor Movements are Fluid and Coordinated: No Fine Motor Movements are Fluid and Coordinated: No Coordination and Movement Description: Incomplete tetraplegia Motor  Motor Motor: Abnormal tone Motor - Discharge Observations: Incomplete tetraplegia; R stronger than L Trunk/Postural Assessment  Cervical Assessment Cervical Assessment: Exceptions to [facility] (Cervical precautions; soft collar for comfort; forward flexion) Thoracic Assessment Thoracic Assessment: Exceptions to [facility] (Forward flexion) Lumbar Assessment Lumbar Assessment: Exceptions to [facility] (Posterior pelvic tilt) Postural Control Postural Control: Deficits on evaluation  Balance Balance Balance Assessed: Yes Static Sitting Balance Static Sitting - Balance Support: Feet supported;Right upper extremity supported;Left upper extremity supported Static Sitting - Level of Assistance: 6: Modified independent (Device/Increase time) Dynamic Sitting Balance Dynamic Sitting - Balance Support: During functional activity;Right upper extremity supported;Left upper extremity supported Dynamic Sitting - Level of Assistance: 5: Stand by assistance;4: Min assist Extremity/Trunk Assessment RUE Assessment RUE Assessment: Within Functional Limits (ROM and strength WFL; Impaired sensation in ulnar nerve distribution area) LUE Assessment LUE Assessment: Exceptions to WFL (Weak tenodesis, 3+/5 elbow and shoulder)   See Function Navigator for Current Functional Status.  Lewis, Celso Granja C 11/14/2015, 1:47 PM

## 2015-11-14 NOTE — Progress Notes (Addendum)
Physical Therapy Discharge Summary  Patient Details  Name: Jasmine [name] MRN: [id number] Date of Birth: [birth year]  Today's Date: 11/14/2015 PT Individual Time: 1430-1530 PT Individual Time Calculation (min): 60 min  Denies pain currently. Pt still awaiting specialty back for w/c to go home with but new cushion was delivered. PT adjusted legrest of w/c to appropriate length for patient. Attempted to lower height of w/c but unable to loosen screw on front caster (even had PT supervisor try) so chair remains in dumped position. Educated pt on differences and how to switch it back to level but higher seat to floor height by wheel placement. Performed functional bed mobility and transfers with overall mod assist using slideboard for transfer and hospital bed functions. Pt propelled w/c on unit (extra time due to not putting theraband on rim because pt requested to wait on that until after her daughter's graduation) but otherwise pt can propel mod I on unit with extra time. Pt engaged in neuro re-ed for balance training and fine motor task on table top to manipulate small objects for fine motor control. End of session left up in bed with all needs in reach.  Pt has met 5 of 6 long term goals due to improved activity tolerance, improved balance, improved postural control, increased strength, increased range of motion, decreased pain, ability to compensate for deficits, functional use of  right upper extremity, right lower extremity and left upper extremity and improved coordination.  Patient to discharge at a wheelchair level [place].   Patient's care partner is independent to provide the necessary physical assistance at discharge.  Reasons goals not met: Pt did not meet car transfer goal at mod assist because she required max assist on real car.   Recommendation:  Patient will benefit from ongoing skilled PT services in home health setting to continue to advance safe functional mobility, address  ongoing impairments in strength, balance, endurance, quadriparesis, abnormal tone, decreased sensation, decreased postural control, decreased functional mobility, and minimize fall risk.  Equipment: 16x16 w/c with J2 cushion and back; 30" slideboard; hospital bed; leg loops  Reasons for discharge: treatment goals met and discharge from hospital  Patient/family agrees with progress made and goals achieved: Yes  PT Discharge Precautions/Restrictions Precautions Precautions: Cervical Required Braces or Orthoses: Cervical Brace;Other Brace/Splint Cervical Brace: Soft collar;For comfort Other Brace/Splint: wrist brace on L Restrictions Weight Bearing Restrictions: No Cognition Overall Cognitive Status: Within Functional Limits for tasks assessed Arousal/Alertness: Awake/alert Orientation Level: Oriented X4 Memory: Appears intact Safety/Judgment: Appears intact Sensation Sensation Light Touch: Impaired Detail Light Touch Impaired Details: Impaired RUE;Impaired LUE;Impaired RLE;Impaired LLE Proprioception: Impaired Detail Proprioception Impaired Details: Impaired LLE;Impaired RLE;Impaired LUE Coordination Gross Motor Movements are Fluid and Coordinated: No Fine Motor Movements are Fluid and Coordinated: No Coordination and Movement Description: Incomplete tetraplegia Motor  Motor Motor: Tetraplegia;Abnormal tone;Abnormal postural alignment and control Motor - Discharge Observations: Incomplete tetraplegia; R stronger than L     Trunk/Postural Assessment  Cervical Assessment Cervical Assessment: Exceptions to [facility] (cervical precautions; soft collar for comfort; forward flexe) Thoracic Assessment Thoracic Assessment: Exceptions to [facility] (flexed posture) Lumbar Assessment Lumbar Assessment: Exceptions to [facility] (posterior pelvic tilt) Postural Control Postural Control: Deficits on evaluation  Balance Balance Balance Assessed: Yes Static Sitting Balance Static Sitting - Balance  Support: Feet supported;Right upper extremity supported;Left upper extremity supported Static Sitting - Level of Assistance: 6: Modified independent (Device/Increase time) Dynamic Sitting Balance Dynamic Sitting - Balance Support: During functional activity;Right upper extremity supported;Left upper extremity supported Dynamic Sitting -  Level of Assistance: 5: Stand by assistance;4: Min assist Extremity Assessment  RUE Assessment RUE Assessment: Within Functional Limits (ROM and strength WFL; Impaired sensation in ulnar nerve distribution area) LUE Assessment LUE Assessment: Exceptions to WFL (Weak tenodesis, 3+/5 elbow and shoulder) RLE Assessment RLE Assessment: Exceptions to [facility] RLE Strength RLE Overall Strength Comments: grossly 2+ to 3-/5 LLE Assessment LLE Assessment: Exceptions to [facility] LLE Strength LLE Overall Strength Comments: ankle trace to 2-/5; knee 2-/5, hip trace to 2-/5   See Function Navigator for Current Functional Status.  Canary Brim Ivory Broad, PT, DPT  11/14/2015, 4:04 PM

## 2015-11-14 NOTE — Plan of Care (Signed)
Problem: SCI BLADDER ELIMINATION Goal: RH STG SCI MANAGE BLADDER PROGRAM W/ASSISTANCE Patient/caregiver will manage bladder program with Min assistance prior to discharge.  Outcome: Completed/Met Date Met:  11/14/15 Husband appropriately performing I&O cath without assistance from staff.

## 2015-11-14 NOTE — Progress Notes (Signed)
Occupational Therapy Session Note  Patient Details  Name: Jasmine EddyBrenda Appelt MRN: [id number] Date of Birth: Jun 01, 1976  Today's Date: 11/14/2015 OT Individual Time: 1045-1200 OT Individual Time Calculation (min): 75 min    Short Term Goals: Week 4:  OT Short Term Goal 1 (Week 4): STG=LTG due to LOS  Skilled Therapeutic Interventions/Progress Updates:    Pt seen for OT session focusing on ADL re-training and functional transfers. Pt in supine upon arrival, agreeable to tx session. Husband present for session and assisted with all transfers. She completed sliding board transfer w/c > bed and returned to supine to complete LB dressing. She required min A to roll to pull pants down.  In bed with HOB elevated, Pt donned pants, requiring min A for managing LEs to place LEs into modified circle sit position. With increased time and encouragement pt able to thread B LEs into pants and don shoes, she rolled with min A, when rolling to R pt able to use R hand to assist with pulling pants up.  She completed sliding board transfer from EOB> padded tub bench for simulation of toilet transfer, husband pt's assisting with transfer. She completed simulated toileting task with husband providing total A. Pt completing lateral leans into bed on the L and into w/c placed on the R while caregiver provided total A.  Pt returned to w/c and left set-up with meal tray and all needs in reach. Pt and husband with questions regarding showering at d/c. Initially not addressed as pt's shower on second level of home, however, now desiring to go to family member's house to shower. Discussed use of tub bench for showering task with concern regarding sliding board transfers in/out of shower and pt's dynamic sitting balance for complex task. Recommended to follow up with HHOT and to get his/her clearance before attempting as she cont to make progress.   Therapy Documentation Precautions:  Precautions Precautions:  Cervical Precaution Comments: orthostatic BPs Restrictions Weight Bearing Restrictions: No Pain:   No/ denies pain  See Function Navigator for Current Functional Status.   Therapy/Group: Individual Therapy  Lewis, Elenore Wanninger C 11/14/2015, 12:28 PM

## 2015-11-14 NOTE — Progress Notes (Signed)
Physical Therapy Session Note  Patient Details  Name: Jasmine [name] MRN: [id number] Date of Birth: [birth year]  Today's Date: 11/14/2015 PT Individual Time: 0800-0900 PT Individual Time Calculation (min): 60 min   Short Term Goals: Week 4:  PT Short Term Goal 1 (Week 4): = LTGs  Skilled Therapeutic Interventions/Progress Updates:   Session focused on family education with pt and pt's husband in regards to real car transfer training with slideboard, bed mobility and transfer OOB, and education on w/c breakdown for transport and finalizing concerns for d/c tomorrow. Husband performed transfers with mod assist overall and in car mod/max assist due limited space and positioning of slideboard. Husband return demonstrated all techniques successfully.   Therapy Documentation Precautions:  Precautions Precautions: Cervical Precaution Comments: orthostatic BPs Restrictions Weight Bearing Restrictions: No  Pain: No complaints.   See Function Navigator for Current Functional Status.   Therapy/Group: Individual Therapy  Karolee StampsGray, Sayvon Arterberry Darrol PokeBrescia  Mikahla Wisor B. Marisha Renier, PT, DPT  11/14/2015, 9:01 AM

## 2015-11-14 NOTE — Discharge Summary (Signed)
Jasmine [name]:  Jasmine [name], Jasmine [name]            ACCOUNT NO.:  [id number]  MEDICAL RECORD NO.:  [id number]  LOCATION:  [id number]                        FACILITY:  [facility]  PHYSICIAN:  Ranelle OysterZachary T. Swartz, M.D.DATE OF BIRTH:  02-03-1976  DATE OF ADMISSION:  10/18/2015 DATE OF DISCHARGE:  11/15/2015                              DISCHARGE SUMMARY   DISCHARGE DIAGNOSES: 1. 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  Bilateral foraminal compromise,     status post ACDF. 2. SCDs for deep venous thrombosis prophylaxis.Xarelto added at time of discharge 3. Pain management. 4. Mood. 5. Neurogenic bowel and bladder. 6. Candidiasis. 7. Gastroesophageal reflux disease.  HISTORY OF PRESENT ILLNESS:  This is a 40 year old right-handed female, admitted on Oct 10, 2015, with progressive severe neck pain radiating to left upper extremity since a fall from a ladder on September 13, 2015.  She lives with spouse and children.  Two-level home.  MRI showed cord signal at the disk space C5-6 cord compression, large osteophyte disk at that level, bilateral foraminal compromise.  She had some slight compression at C4-5.  Underwent anterior cervical decompression C4-5-5-6 arthrodesis on Oct 10, 2015, per Dr. Franky Machoabbell.  Postoperative lower extremity weakness.  She returned to the operating room for emergent neck exploration.  No hematoma identified to the wound, hardware with good position.  Decadron protocol initiated.  Hospital course, pain management.  The patient was admitted for comprehensive rehab program.  PAST MEDICAL HISTORY:  See discharge diagnoses.  SOCIAL HISTORY:  Independent prior to admission.  Working as a Conservation officer, naturecashier, living with her husband and family.  Functional status upon admission to rehab services was +2, physical assistance supine to sit.  Max total assist activities of daily living.  PHYSICAL EXAMINATION:  VITAL SIGNS:  Blood pressure 146/77, pulse  47, temperature 98, respirations 18. GENERAL:  This was an alert female, oriented x3. LUNGS:  Clear to auscultation without wheeze. CARDIAC:  Regular rate and rhythm.  No murmur. ABDOMEN:  Soft, nontender.  Good bowel sounds. EXTREMITIES:  Deep tendon reflexes, symmetric in bilateral upper extremities, 3+ and lowers.  Sensation decreased in wrist bilaterally left more than right.  Left upper extremity shoulder abduction, elbow flexion 4+ to 5, elbow extension 1/5, wrist extension 2/5, finger grip 1/5. NECK:  Incision clean and dry.  REHABILITATION HOSPITAL COURSE:  Patient was admitted to inpatient rehab services with therapies initiated on a 3-hour daily basis, consisting of physical therapy, occupational therapy, and rehabilitation nursing.  The following issues were initiated during the patient's rehabilitation course.  Pertaining to [name] incomplete C5-6 spinal cord injury, tetraplegia cord compression, she had undergone ACDF per Neurosurgery, Dr. Franky Machoabbell.  Followup MRI showed cervical myelomalacia with operative site intact.  No cord root compromise, cervical collar for comfort. Subcutaneous Lovenox for DVT prophylaxis, venous Doppler studies Negative.Xarelto initiated prior to discharge for DVT prophylaxis as patient did not want to have injections of Lovenox at home.  Pain management improved overall with the use of baclofen titrated to 15 mg 3 times daily.  She remained on Lyrica 150 mg at bedtime, 75 mg during the day, Cymbalta, as well as hydrocodone and Robaxin.  Low-dose  Pamelor was added and titrated to 20 mg at bedtime. Neurogenic bowel and bladder, encouraged to increase fluid intake in a.m. less than p.m. to decreased overnight catheterization needs. Husband was independent with providing her intermittent catheterizations.  Full teaching in regard to bowel and bladder were completed.  She continued on Decadron low-dose as advised.  Mild elevation in white blood  cell count felt to be induced by Decadron.  She remained afebrile.  The patient received weekly collaborative interdisciplinary team conferences to discuss estimated length of stay, family teaching, and any barriers to discharge.  Total assist transferred supine to sit with the use of hospital bed features and moderate assist.  A physical therapy provided total assist for sliding board placement, but able to successfully lean to the left with hand support on bed rail.  Completed sliding board transfers to the right with max assist with multiple scoots.  Transported to the gym for therapies, wheelchair, total assist, stood and standing frame 39 minutes while performing left upper extremity tasks for strengthening. Activities of daily living and homemaking.  She was able to self propel the wheelchair on a day pass and self identify when she needed a rest break.  Needed assistance for lower body activity, daily living, grooming, dressing, homemaking.  Full family teaching was completed with her husband and family and plan was discharge to home with ongoing therapies dictated per [employer].  DISCHARGE MEDICATIONS: 1. B12 complex one tablet p.o. daily. 2. Baclofen 15 mg p.o. t.i.d. 3. Dulcolax suppository daily. 4. Decadron taper as indicated. 5. Valium 5 mg p.o. at bedtime for muscle spasms. 6. Cymbalta 30 mg daily. 7. Pamelor 20 mg p.o. at bedtime. 8. Hydrocodone 1 to 2 tablets every 4 hours as needed for pain. 9. Robaxin 500 mg p.o. every 6 hours as needed for muscle spasms. 10.Ditropan 5 mg p.o. b.i.d. 11.Protonix 40 mg p.o. daily. 12.MiraLAX daily, hold for loose stool. 13.Lyrica 150 mg p.o. at bedtime. 14.Lyrica 75 mg p.o. daily. 15.Senokot-S tablets 3 p.o. Daily. 16.Xarelto 10 mg daily  DIET:  Regular.  She will continue to maintain wrist splint on the left as directed.  She would follow up Dr. Faith Rogue at the outpatient rehab service office as indicated; Dr. Coletta Memos, Neurosurgery 2 weeks, call for appointment; Dr. Baruch Gouty, Medical Management.     Mariam Dollar, P.A.   ______________________________ Ranelle Oyster, M.D.    DA/MEDQ  D:  11/14/2015  T:  11/14/2015  Job:  [id number]  cc:   Coletta Memos, M.D. Kerman Passey, MD Ranelle Oyster, M.D.

## 2015-11-15 ENCOUNTER — Telehealth: Payer: Self-pay | Admitting: Family Medicine

## 2015-11-15 MED ORDER — OXYBUTYNIN CHLORIDE 5 MG PO TABS: 5.0000 mg | ORAL_TABLET | Freq: Two times a day (BID) | ORAL | Status: DC

## 2015-11-15 MED ORDER — SENNOSIDES-DOCUSATE SODIUM 8.6-50 MG PO TABS: 3.0000 | ORAL_TABLET | Freq: Every day | ORAL | Status: DC

## 2015-11-15 MED ORDER — NORTRIPTYLINE HCL 10 MG PO CAPS: 20.0000 mg | ORAL_CAPSULE | Freq: Every day | ORAL | Status: DC

## 2015-11-15 MED ORDER — OMEGA-3-ACID ETHYL ESTERS 1 G PO CAPS: 1.0000 g | ORAL_CAPSULE | Freq: Every day | ORAL | Status: DC

## 2015-11-15 MED ORDER — BACLOFEN 10 MG PO TABS: 15.0000 mg | ORAL_TABLET | Freq: Three times a day (TID) | ORAL | Status: DC

## 2015-11-15 MED ORDER — BISACODYL 10 MG RE SUPP: 10.0000 mg | Freq: Every day | RECTAL | Status: AC

## 2015-11-15 MED ORDER — B COMPLEX-C PO TABS: 1.0000 | ORAL_TABLET | Freq: Every day | ORAL | Status: DC

## 2015-11-15 MED ORDER — METHOCARBAMOL 500 MG PO TABS: 500.0000 mg | ORAL_TABLET | Freq: Four times a day (QID) | ORAL | Status: DC | PRN

## 2015-11-15 MED ORDER — DEXAMETHASONE 1 MG PO TABS: 1.0000 mg | ORAL_TABLET | Freq: Two times a day (BID) | ORAL | Status: DC

## 2015-11-15 MED ORDER — RIVAROXABAN 10 MG PO TABS: 10.0000 mg | ORAL_TABLET | Freq: Every day | ORAL | Status: DC

## 2015-11-15 MED ORDER — HYDROCODONE-ACETAMINOPHEN 5-325 MG PO TABS: 1.0000 | ORAL_TABLET | ORAL | Status: DC | PRN

## 2015-11-15 MED ORDER — DULOXETINE HCL 30 MG PO CPEP: 30.0000 mg | ORAL_CAPSULE | Freq: Every day | ORAL | Status: DC

## 2015-11-15 MED ORDER — PREGABALIN 75 MG PO CAPS
ORAL_CAPSULE | ORAL | Status: DC
Start: 2015-11-15 — End: 2015-12-17

## 2015-11-15 MED ORDER — PANTOPRAZOLE SODIUM 40 MG PO TBEC: 40.0000 mg | DELAYED_RELEASE_TABLET | Freq: Every day | ORAL | Status: DC

## 2015-11-15 NOTE — Progress Notes (Signed)
Patient with nose bleed this AM, pt stated "I just felt the blood trickling down my face." Bright red blood.  Bleeding stopped after several tissues. Dayshift RN aware, to continue to monitor.

## 2015-11-15 NOTE — Progress Notes (Signed)
Patient and spouse received discharge instructions from Harvel Ricksan Anguilli, PA-C with verbal understanding. Patient discharged to home with spouse and belongings.

## 2015-11-15 NOTE — Telephone Encounter (Signed)
Patient just released from the hospital; I called home number, no answer, no voicemail I called cell number; left msg; have been following along with her hospital stay; glad she is returning home; let us know if there is anything we can do before her next visit

## 2015-11-15 NOTE — Progress Notes (Signed)
[place] PHYSICAL MEDICINE & REHABILITATION     PROGRESS NOTE    Subjective/Complaints: Pt excited to go home. Reported some mild nose bleeding this morning. Pain better controlled    ROS:  Sinus congestion, cough. Denies CP, SOB, nausea, vomiting, diarrhea.   Objective: Vital Signs: Blood pressure 105/73, pulse 80, temperature 98.4 F (36.9 C), temperature source Oral, resp. rate 18, weight 69.99 kg (154 lb 4.8 oz), last menstrual period 09/19/2015, SpO2 97 %. No results found. No results for input(s): WBC, HGB, HCT, PLT in the last 72 hours. No results for input(s): NA, K, CL, GLUCOSE, BUN, CREATININE, CALCIUM in the last 72 hours.  Invalid input(s): CO CBG (last 3)  No results for input(s): GLUCAP in the last 72 hours.  Wt Readings from Last 3 Encounters:  11/08/15 69.99 kg (154 lb 4.8 oz)  10/11/15 68.6 kg (151 lb 3.8 oz)  09/18/15 64.411 kg (142 lb)    Physical Exam:  Constitutional: She appears well-developed and well-nourished. NAD. Head: Normocephalic. Atraumatic  Eyes: Conjunctivae and EOM are normal.  Cardiovascular: Tachycardia and regular rhythm.  Respiratory: Effort normal and breath sounds normal. No respiratory distress.  GI: Soft. Bowel sounds are normal. She exhibits no distension.  Musculoskeletal: She exhibits no edema and tenderness.  Neurological: She is alert and oriented.  Sensation intact LT  BLE Motor: Right upper extremity: delt 5/5, bicep 5/5, tricep 4/5, wrist 4/5, HI 4/5.  Left upper extremity: Shoulder abduction, elbow flexion 4+ to 5/5, elbow extension 4-/5, wrist extension 3-/5, Triceps 2, finger grip 3-/5 Left lower extremity trace ADF, 1/5 APF.  Right lower extremity: HF and KE  2- to 2+/5, ADF/PF  2/5, wiggles toes still  Skin:  Neck incision clean and dry   Psychiatric: Her speech is normal. Thought content normal. Her mood is appropriate  Assessment/Plan: 1. 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 Physiatrist is providing close team supervision and 24 hour management of active medical problems listed below. Physiatrist and rehab team continue to assess barriers to discharge/monitor patient progress toward functional and medical goals.  Function:  Bathing Bathing position Bathing activity did not occur: Refused Position: Bed  Bathing parts Body parts bathed by patient: Right arm, Left arm, Chest, Abdomen, Front perineal area, Right upper leg, Left upper leg, Right lower leg, Left lower leg, Buttocks Body parts bathed by helper: Back  Bathing assist Assist Level: Touching or steadying assistance(Pt > 75%)      Upper Body Dressing/Undressing Upper body dressing Upper body dressing/undressing activity did not occur: Refused What is the patient wearing?: Pull over shirt/dress     Pull over shirt/dress - Perfomed by patient: Thread/unthread right sleeve, Thread/unthread left sleeve, Put head through opening, Pull shirt over trunk Pull over shirt/dress - Perfomed by helper: Put head through opening, Pull shirt over trunk        Upper body assist Assist Level: Set up   Set up : To obtain clothing/put away  Lower Body Dressing/Undressing Lower body dressing   What is the patient wearing?: Pants, Shoes     Pants- Performed by patient: Thread/unthread right pants leg, Thread/unthread left pants leg Pants- Performed by helper: Pull pants up/down   Non-skid slipper socks- Performed by helper: Don/doff right sock, Don/doff left sock     Shoes - Performed by patient: Don/doff right shoe, Don/doff left shoe Shoes - Performed by helper: Fasten right, [employer]en  left       TED Hose - Performed by helper: Don/doff right TED hose, Don/doff left TED hose  Lower body assist Assist for lower body dressing: Touching or steadying assistance (Pt > 75%)      Toileting Toileting Toileting activity  did not occur: No continent bowel/bladder event   Toileting steps completed by helper: Adjust clothing prior to toileting, Performs perineal hygiene, Adjust clothing after toileting    Toileting assist Assist level:  (total A of 1)   Transfers Chair/bed transfer Chair/bed transfer activity did not occur: Safety/medical concerns Chair/bed transfer method: Lateral scoot Chair/bed transfer assist level: Moderate assist (Pt 50 - 74%/lift or lower) Chair/bed transfer assistive device: Sliding board, Armrests, Orthosis     Locomotion Ambulation Ambulation activity did not occur: Safety/medical concerns (quadriplegia)         Wheelchair Wheelchair activity did not occur: Safety/medical concerns Type: Manual Max wheelchair distance: 150' Assist Level: Dependent (Pt equals 0%)  Cognition Comprehension Comprehension assist level: Follows complex conversation/direction with no assist  Expression Expression assist level: Expresses complex ideas: With no assist  Social Interaction Social Interaction assist level: Interacts appropriately with others - No medications needed.  Problem Solving Problem solving assist level: Solves complex problems: Recognizes & self-corrects  Memory Memory assist level: Complete Independence: No helper   Medical Problem List and Plan: 1. 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 Status post ACDF C4-5, 5-6.   -dc today. HH follow up arranged  -Patient to see me in the office for transitional care encounter in 1-2 weeks.  -NS follow up to be arranged. Pt has requested another MD in the practice to follow her.  -cervical collar for comfort/support with therapies  -elbow pad RUE---?ulnar compression at elbow---use at night 2. DVT Prophylaxis/Anticoagulation: SCDs.    vascular study negative -xarelto for DVT proph  For 2 months. -she is to call me if any abnl bleeding  occurs 3. Pain Management: overall pain improved  -Baclofen  3 times a day. Valium qhs prn  -Lyrica 150 mg daily at bedtime and  qam  -Cymbalta 30 mg daily.   -Hydrocodone and Robaxin as needed.  -continue pamelor  qhs 4. Mood: team to provide ego support,    -anxiety with some improvement. Has big expectations of herself/recovery  -neuropsych   5. Neuropsych: This patient is capable of making decisions on her own behalf. 6. Skin/Wound Care: Routine skin checks  -decubitus precautions/frequent turning 7. Fluids/Electrolytes/Nutrition:  8. Neurogenic bowel and bladder.    -Encouraged increased fluid intake in am/less in pm to decrease overnight cath needs.   -AM soft/lax with PM suppository, place back on senokot-s. Increase to 3 qam.   -prn miralax  -husband can perform caths now 9. Leukocytosis due to steroids.  -decadron decrease to  bid today and dc after this morning's dose  -WBC decreasing from 17K to 12 K on 5/27 10. Burning with urination/frequency?   -follow up ua clear, ucx no growth on 6/1  -diflucan stopped  -added ditropan 11. Candidiasis:   -diflucan dc'ed 12.  GERD  Protonix qHS 13. Productive cough---likely sinusitis/URI--resolved    LOS (Days) 28 A FACE TO FACE EVALUATION WAS PERFORMED  Dorlisa Savino T 11/15/2015 11:11 AM

## 2015-11-16 NOTE — Progress Notes (Signed)
Social Work  Discharge Note  The overall goal for the admission was met for:   Discharge location: Yes - home with spouse and young adult children providing assistance  Length of Stay: Yes - 28 days  Discharge activity level: Yes - min assist w/c  Home/community participation: Yes  Services provided included: MD, RD, PT, OT, RN, TR, Pharmacy, [place]: Private Insurance: [place]  Follow-up services arranged: Home Health: Therapist, sports, PT, OT with [facility]), DME: 16x16 lightweight w/c with Ulice Dash 2 back cushion and [place] 2 seat cushion, hospital bed, transfer board, Other: Cath supplies via [employer] and Patient/Family has no preference for HH/DME agencies  Comments (or additional information): Assisted pt with start of SSD application with telephone interview with pt to take place 6/20 @ 12:45   Patient/Family verbalized understanding of follow-up arrangements: Yes  Individual responsible for coordination of the follow-up plan: pt  Confirmed correct DME delivered: Lydia Meng 11/16/2015  Note* the Ulice Dash 2 back cushion for w/c had not arrived at the time pt was d/c'd home, however, [facility] to send to pt's home  Linnet Bottari

## 2015-11-19 ENCOUNTER — Telehealth: Payer: Self-pay | Admitting: Family Medicine

## 2015-11-19 NOTE — Telephone Encounter (Signed)
Thank you; the hospital doctors were very kind to include me on her discharge summary and I put a call in to the patient when she was discharged (left msg)

## 2015-11-19 NOTE — Telephone Encounter (Signed)
Sherry from [employer] giving curtesy call. States patient is currently working with Case management with [employer]. She was discharged from hospital last Friday is paralyzed due to fall.

## 2015-11-20 ENCOUNTER — Telehealth: Payer: Self-pay | Admitting: Physical Medicine & Rehabilitation

## 2015-11-20 NOTE — Telephone Encounter (Signed)
Jasmine [name] with [facility] is needing verbal orders for skilled nursing.  Please call her at [phone].

## 2015-11-21 NOTE — Telephone Encounter (Signed)
Requesting verbal orders for 1wk4 for in and out cath management. Spoke with Marchelle FolksAmanda, verbal orders approved.

## 2015-11-22 ENCOUNTER — Telehealth: Payer: Self-pay | Admitting: Physical Medicine & Rehabilitation

## 2015-11-22 NOTE — Telephone Encounter (Signed)
Patient having a lot of pain in bladder.  Would like to know what she can do.  Please call her at [phone].  She has an appointment on Monday with Dr. Riley KillSwartz.

## 2015-11-25 ENCOUNTER — Encounter
Payer: BLUE CROSS/BLUE SHIELD | Attending: Physical Medicine & Rehabilitation | Admitting: Physical Medicine & Rehabilitation

## 2015-11-25 ENCOUNTER — Encounter: Payer: Self-pay | Admitting: Physical Medicine & Rehabilitation

## 2015-11-25 VITALS — BP 98/61 | HR 86 | Resp 14

## 2015-11-25 DIAGNOSIS — S14105S Unspecified injury at C5 level of cervical spinal cord, sequela: Secondary | ICD-10-CM

## 2015-11-25 DIAGNOSIS — R531 Weakness: Secondary | ICD-10-CM | POA: Diagnosis not present

## 2015-11-25 DIAGNOSIS — R200 Anesthesia of skin: Secondary | ICD-10-CM | POA: Diagnosis not present

## 2015-11-25 DIAGNOSIS — G8250 Quadriplegia, unspecified: Secondary | ICD-10-CM

## 2015-11-25 DIAGNOSIS — N319 Neuromuscular dysfunction of bladder, unspecified: Secondary | ICD-10-CM | POA: Diagnosis not present

## 2015-11-25 DIAGNOSIS — K219 Gastro-esophageal reflux disease without esophagitis: Secondary | ICD-10-CM | POA: Diagnosis not present

## 2015-11-25 DIAGNOSIS — S14109S Unspecified injury at unspecified level of cervical spinal cord, sequela: Secondary | ICD-10-CM

## 2015-11-25 DIAGNOSIS — K592 Neurogenic bowel, not elsewhere classified: Secondary | ICD-10-CM | POA: Diagnosis not present

## 2015-11-25 DIAGNOSIS — G8254 Quadriplegia, C5-C7 incomplete: Secondary | ICD-10-CM | POA: Diagnosis present

## 2015-11-25 DIAGNOSIS — R252 Cramp and spasm: Secondary | ICD-10-CM | POA: Insufficient documentation

## 2015-11-25 DIAGNOSIS — R011 Cardiac murmur, unspecified: Secondary | ICD-10-CM | POA: Diagnosis not present

## 2015-11-25 DIAGNOSIS — G4700 Insomnia, unspecified: Secondary | ICD-10-CM | POA: Insufficient documentation

## 2015-11-25 DIAGNOSIS — R51 Headache: Secondary | ICD-10-CM | POA: Diagnosis not present

## 2015-11-25 DIAGNOSIS — R52 Pain, unspecified: Secondary | ICD-10-CM | POA: Insufficient documentation

## 2015-11-25 DIAGNOSIS — N3289 Other specified disorders of bladder: Secondary | ICD-10-CM | POA: Diagnosis not present

## 2015-11-25 DIAGNOSIS — S14109A Unspecified injury at unspecified level of cervical spinal cord, initial encounter: Secondary | ICD-10-CM | POA: Insufficient documentation

## 2015-11-25 MED ORDER — OXYBUTYNIN CHLORIDE 5 MG PO TABS: 10.0000 mg | ORAL_TABLET | Freq: Three times a day (TID) | ORAL | Status: DC

## 2015-11-25 NOTE — Telephone Encounter (Signed)
Will address at appointment today with ZS.

## 2015-11-25 NOTE — Patient Instructions (Signed)
PLEASE CALL ME WITH ANY PROBLEMS OR QUESTIONS ([phone])  
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
## 2015-11-26 ENCOUNTER — Telehealth: Payer: Self-pay

## 2015-11-26 NOTE — Telephone Encounter (Signed)
Baruch GoutyErin Webb- Home Health PT- is calling for verbal orders for 3wk4 and 2wk4 for strengthening, safety, and transfer safety. Verbal orders approved per office protocol.

## 2015-11-28 ENCOUNTER — Telehealth: Payer: Self-pay

## 2015-11-28 NOTE — Telephone Encounter (Signed)
Pt needs a refill on hydrocodone and nortriptyline. Please advise on refills?

## 2015-11-29 ENCOUNTER — Telehealth: Payer: Self-pay | Admitting: Registered Nurse

## 2015-11-29 MED ORDER — NORTRIPTYLINE HCL 10 MG PO CAPS: 20.0000 mg | ORAL_CAPSULE | Freq: Every day | ORAL | Status: DC

## 2015-11-29 MED ORDER — HYDROCODONE-ACETAMINOPHEN 5-325 MG PO TABS: 1.0000 | ORAL_TABLET | Freq: Four times a day (QID) | ORAL | Status: DC | PRN

## 2015-11-29 NOTE — Telephone Encounter (Signed)
Prescription  Printed

## 2015-11-29 NOTE — Telephone Encounter (Signed)
Sent a message to Dr. Riley KillSwartz asking  for clarification in regards to the Hydrocodone. On June 9th she was prescribed 1-2 tablets every 4 hours as needed. #90. In two weeks she has called for a refill.. Will prescribe Hydrocodone one tablet every 6 hours, was in contact with Dr. Riley KillSwartz he agrees with plan. [name] was called, she states she was taking her hydrocodone one tablet in the morning and two at bedtime due to bladder spasms. I instructed for her to take one tablet every 6 hours as needed, if she is unable to tolerate the pain to call office. She verbalizes understanding.

## 2015-11-29 NOTE — Telephone Encounter (Signed)
Can you please print hydrocodone script?

## 2015-11-29 NOTE — Addendum Note (Signed)
Addended by: Lucretia RoersWOOD, AMBER L on: 11/29/2015 12:06 PM   Modules accepted: Orders

## 2015-11-29 NOTE — Telephone Encounter (Signed)
May have refills on both.

## 2015-12-02 ENCOUNTER — Telehealth: Payer: Self-pay | Admitting: Physical Medicine & Rehabilitation

## 2015-12-02 DIAGNOSIS — S14105S Unspecified injury at C5 level of cervical spinal cord, sequela: Secondary | ICD-10-CM

## 2015-12-02 DIAGNOSIS — K592 Neurogenic bowel, not elsewhere classified: Secondary | ICD-10-CM

## 2015-12-02 DIAGNOSIS — N319 Neuromuscular dysfunction of bladder, unspecified: Secondary | ICD-10-CM

## 2015-12-02 DIAGNOSIS — G8250 Quadriplegia, unspecified: Secondary | ICD-10-CM

## 2015-12-02 MED ORDER — OXYBUTYNIN CHLORIDE 5 MG PO TABS: 10.0000 mg | ORAL_TABLET | Freq: Two times a day (BID) | ORAL | Status: DC

## 2015-12-02 NOTE — Telephone Encounter (Signed)
Junious Dresseronnie OT with [facility] needs to get a verbal for 2w4 and 1w1.  Please call her at [phone].

## 2015-12-02 NOTE — Telephone Encounter (Signed)
Patient is having a lot of pain and would like to have something done.  Patient will also need a prescription refill.  Husband did not state which medication it was.  Please call him at [phone] or [phone].

## 2015-12-02 NOTE — Telephone Encounter (Signed)
Patient is in a great deal of pain and her number of leg spasms has increased tremendously. Is there something more you can give for the pain and spasms. Oxybutynin needs refilled since the does was increased. Patient is taking robaxin and baclofen for leg pain and spasms. What can she do for the increased pain and spasms?

## 2015-12-02 NOTE — Telephone Encounter (Signed)
[name] please review and advise.

## 2015-12-02 NOTE — Telephone Encounter (Signed)
Ms.  Jasmine [name] states she needed her Ditropan re-filled. According to Dr. Riley KillSwartz note he sated BID/ TID and placed a referral to Urology. Referral was semt on 11/27/15. Will ask Jasmine [name] to check on the referral. She hasn't heard anything from [facility]. She verbalizes understanding.

## 2015-12-02 NOTE — Telephone Encounter (Signed)
Called and gave verbal order for OT to [place].

## 2015-12-02 NOTE — Telephone Encounter (Signed)
Jasmine [name] call returned, she states since her hydrocodone was decreased she has been in excruciating pain over the weekend. States her pain is located in her lower back and lower extremities. Admits to taking two hydrocodone tablets at times. I asked when she was taking one hydrocodone did she receive any relief of pain and for how many hours. She states she can't remember.  She has a follow up appointment scheduled with Dr. Riley KillSwartz on 12/30/2015. I have given her permission to take her Hydrocodone two tablets every 6 hours for three days. I will see if Dr. Riley KillSwartz would like to change her to Hydrocodone to 10 mg Q 6 hour. We will call her with Dr. Riley KillSwartz response. She verbalizes understanding. Also instructed to continue her muscle relaxer's. She verbalizes understanding.

## 2015-12-03 ENCOUNTER — Telehealth: Payer: Self-pay

## 2015-12-03 NOTE — Telephone Encounter (Signed)
Kelly-PT with [facility]-needs verbal orders for a Child psychotherapistsocial worker to discuss disability options. Left message to approve verbal orders per office protocol.

## 2015-12-06 ENCOUNTER — Telehealth: Payer: Self-pay | Admitting: Registered Nurse

## 2015-12-06 NOTE — Telephone Encounter (Signed)
Mr. Jasmine [name] arrived to the office on 12/05/15 at 3:45 pm. He states he called the office on 12/04/2015, he never received a call back. He states his wife Jasmine [name] has been having some episodes of incontinency. He has been using a different catheter and she has been taking her Ditropan BID. He was instructed to resume the Ditropan to TID. He also states he went back to the original catheter's as well. [name] has an appointment with the urologist on July 7th, 2017. Also instructed to call office on Monday December 09, 2015 . He verbalizes understanding.

## 2015-12-09 ENCOUNTER — Other Ambulatory Visit: Payer: Self-pay | Admitting: Physical Medicine & Rehabilitation

## 2015-12-11 ENCOUNTER — Telehealth: Payer: Self-pay | Admitting: Family Medicine

## 2015-12-11 DIAGNOSIS — S14105S Unspecified injury at C5 level of cervical spinal cord, sequela: Secondary | ICD-10-CM

## 2015-12-11 DIAGNOSIS — M5416 Radiculopathy, lumbar region: Secondary | ICD-10-CM

## 2015-12-11 NOTE — Telephone Encounter (Signed)
Pt called again with better information Dr Serita [facility]ari Kari [facility] Box [phone] [address] [place] [id number] pt has an appt 12/23/2015 phone # [phone]

## 2015-12-11 NOTE — Assessment & Plan Note (Signed)
Patient has upcoming [facility]

## 2015-12-11 NOTE — Telephone Encounter (Signed)
Pt called stating she needs to try and get a referral. 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 Pt would like a referral to [place] [facility] [place] [facility]. Address for there office is [address] [place] [address], Phone # is [phone]. Pt wants this referral for a second opinion. You are welcome to call the patient as well if needed. Please advise.

## 2015-12-11 NOTE — Telephone Encounter (Signed)
Patient is calling with Sciatica pain. The pain has been going on for 1 week and getting worse. She was unable to get out of bed this morning. She is unable to do PT because the pain is so bad. She wants to know if she can come in for an injection. Jasmine [name] looked at your schedule and is unable to find a spot until July 24.

## 2015-12-11 NOTE — Telephone Encounter (Signed)
I called patient. Increase lyrica to 150mg  BID.  I have ordered an MRI. Please schedule.   thanks

## 2015-12-11 NOTE — Telephone Encounter (Signed)
I spoke with patient; she updated me on her condition; referral coordinator working on this as we spoke; patient also have sciatica issues; she was on six weeks of steroids; she is already on muscle relaxers; asked if shot would help; I respectfully defer to neurologist to see if localized injection under US or something else might be helpful, since I've not seen her since all of this has occurred; she says she is on about 30 pills; I wish her well, she says her spirits are okay, she is pushing forward

## 2015-12-16 ENCOUNTER — Telehealth: Payer: Self-pay | Admitting: Physical Medicine & Rehabilitation

## 2015-12-16 NOTE — Telephone Encounter (Signed)
Patient called to let us know that her blood pressure has been low and she has been feeling dizzy.

## 2015-12-16 NOTE — Telephone Encounter (Signed)
She also states her Lyrica has not been filled yet.  Can someone please call patient.

## 2015-12-17 ENCOUNTER — Telehealth: Payer: Self-pay | Admitting: Registered Nurse

## 2015-12-17 MED ORDER — PREGABALIN 150 MG PO CAPS: 150.0000 mg | ORAL_CAPSULE | Freq: Two times a day (BID) | ORAL | Status: DC

## 2015-12-17 MED ORDER — PREGABALIN 75 MG PO CAPS: ORAL_CAPSULE | ORAL | Status: DC

## 2015-12-17 NOTE — Telephone Encounter (Signed)
I spoke to Jasmine [name], she states the physical therapist came to her home today and her blood pressure was fine. She can't recall the reading. Also has purchase a blood pressure apparatus, instructed to check her blood pressure twice a day and to keep a blood pressure log and follow up with  With her PCP. She states she has called office for re-fill for Lyrica  And she didn't received the right amount of capsules, she states Dr. Riley KillSwartz increase her dose. Dr. Riley KillSwartz had called Jasmine [name] on July 5th and increased her Lyrica to 150 mg BID. She will use this current script and  Jasmine [name] was instructed to call office on July 31 st and Lyrica will be prescribed per Dr. Riley KillSwartz order, she verbalizes understanding. A MRI was ordered on July 5th, awaiting on insurance authorization. Jasmine [name] has been taking her Hydrocodone 1-2 tablets every 6 hours with an average between 4-5 tablets a day. Instructed to count her hydrocodone tablets and to call office in the morning. She has an appointment with Dr. Riley KillSwartz on 01/01/16. She verbalizes understanding.

## 2015-12-17 NOTE — Telephone Encounter (Signed)
Lyrica has been called in the pharmacy. BP has been ranging 90-100 systolic, which is low for the pt's "normal range". Pt has been feeling dizzy. Also, pt's leg spasms are getting worse. PT is having a hard time with therapy due to the spasms. Therapists and husband are noticing the increase. Can you please advise on what pt can do until ZS gets back from vacation?

## 2015-12-18 ENCOUNTER — Telehealth: Payer: Self-pay | Admitting: Registered Nurse

## 2015-12-18 MED ORDER — HYDROCODONE-ACETAMINOPHEN 5-325 MG PO TABS: 1.0000 | ORAL_TABLET | Freq: Four times a day (QID) | ORAL | Status: DC | PRN

## 2015-12-18 NOTE — Telephone Encounter (Signed)
Mr. Jasmine [name] called office today, [name] has  20 tablets of Hydrocodone. She has been taking the hydrocodone 1-2 tablets every 6 hours  ( no more than 5 a day).  [facility] was [facility] up on 11/29/15.  Hydrocodone prescription printed, her husband Jasmine [name] will pick up the prescription today.  She has a follow up appointment on December 30, 2015.

## 2015-12-19 ENCOUNTER — Telehealth: Payer: Self-pay | Admitting: *Deleted

## 2015-12-19 NOTE — Telephone Encounter (Signed)
Patient called asking for Jasmine [name] to call her back, no specifics given

## 2015-12-19 NOTE — Telephone Encounter (Signed)
Return [name] call,  She was having burning upon urination and bought Cystex, according to the directions she states it stated to call the Dr. If your on anti-coagulation.  Looked up Cystex, unable to find accurate information spoke to Pharmacists , it can increase the chance for bleeding. I asked about pyridium no interactions with Xarelto. I called Jasmine [name] and asked her to call her Urologist and ask about the Pyridium, she verbalizes understanding.  She called the Urologist and they wanted a new urine sample, this is being done today. Urology Following.

## 2015-12-22 DIAGNOSIS — N319 Neuromuscular dysfunction of bladder, unspecified: Secondary | ICD-10-CM | POA: Diagnosis not present

## 2015-12-22 DIAGNOSIS — G8254 Quadriplegia, C5-C7 incomplete: Secondary | ICD-10-CM | POA: Diagnosis not present

## 2015-12-22 DIAGNOSIS — K592 Neurogenic bowel, not elsewhere classified: Secondary | ICD-10-CM | POA: Diagnosis not present

## 2015-12-22 DIAGNOSIS — Z9181 History of falling: Secondary | ICD-10-CM

## 2015-12-22 DIAGNOSIS — S14105S Unspecified injury at C5 level of cervical spinal cord, sequela: Secondary | ICD-10-CM | POA: Diagnosis not present

## 2015-12-22 DIAGNOSIS — Z466 Encounter for fitting and adjustment of urinary device: Secondary | ICD-10-CM

## 2015-12-23 ENCOUNTER — Telehealth: Payer: Self-pay | Admitting: Registered Nurse

## 2015-12-23 DIAGNOSIS — K592 Neurogenic bowel, not elsewhere classified: Secondary | ICD-10-CM

## 2015-12-23 DIAGNOSIS — N319 Neuromuscular dysfunction of bladder, unspecified: Secondary | ICD-10-CM

## 2015-12-23 DIAGNOSIS — G8250 Quadriplegia, unspecified: Secondary | ICD-10-CM

## 2015-12-23 DIAGNOSIS — S14105S Unspecified injury at C5 level of cervical spinal cord, sequela: Secondary | ICD-10-CM

## 2015-12-23 MED ORDER — OXYBUTYNIN CHLORIDE 5 MG PO TABS: 10.0000 mg | ORAL_TABLET | Freq: Three times a day (TID) | ORAL | Status: DC

## 2015-12-23 NOTE — Telephone Encounter (Signed)
Mr. Jasmine [name] called office, stating his wife is running out of the Ditropan. Also states she is still experiencing urinary incontinence. She has been taking the Ditropan two tablets TID. New script sent to pharmacy. Also instructed to follow up with the urologist. He verbalizes understanding.

## 2015-12-24 ENCOUNTER — Telehealth: Payer: Self-pay | Admitting: Physical Medicine & Rehabilitation

## 2015-12-24 NOTE — Telephone Encounter (Signed)
Patient was told to call Jasmine [name] today.

## 2015-12-25 MED ORDER — OXYBUTYNIN CHLORIDE ER 15 MG PO TB24: ORAL_TABLET | ORAL | Status: DC

## 2015-12-25 NOTE — Telephone Encounter (Signed)
Spoke with Dr. Riley KillSwartz,  We will prescribe Ditropan XL 15 mg 24 Hr. Tablet every 12 hours. Will call [name] .

## 2015-12-25 NOTE — Telephone Encounter (Signed)
Spoke to Mr. Jasmine [name] this morning, he states yesterday his wife Jasmine [name] was in excruciating pain and was experiencing SOB. He states her blood pressure at that time was 145/118. He sat her up and her blood pressure was 105/76. He obtained two blood pressure through out the day. The readings were 111/80 and 114/71.  Asked if they called there PCP, he states they are in the process of obtaining one. Also stated the Pharmacist wouldn't fill the Ditropan, [facility] and [facility] the Pharmacist, she states the insurance not covering the 180 tablets, they only cover 120 tablets. Will speak to Dr. Riley KillSwartz if he want a prior approval for quantity limit exception or switch her to XR.  Place a call to Mr. Jasmine [name] he is aware of the above, will give him a return call after speaking to Dr. Riley KillSwartz.

## 2015-12-27 ENCOUNTER — Other Ambulatory Visit: Payer: Self-pay

## 2015-12-27 MED ORDER — BACLOFEN 10 MG PO TABS: 15.0000 mg | ORAL_TABLET | Freq: Three times a day (TID) | ORAL | Status: DC

## 2015-12-27 NOTE — Telephone Encounter (Signed)
Pt called for a refill on her Baclofen. Baclofen refill sent to pharmacy. Spoke with pt and made aware of refill.

## 2015-12-27 NOTE — Telephone Encounter (Signed)
First refill did not have enough tablets. Second refill sent to correct.

## 2015-12-30 ENCOUNTER — Encounter
Payer: BLUE CROSS/BLUE SHIELD | Attending: Physical Medicine & Rehabilitation | Admitting: Physical Medicine & Rehabilitation

## 2015-12-30 ENCOUNTER — Encounter: Payer: Self-pay | Admitting: Physical Medicine & Rehabilitation

## 2015-12-30 VITALS — BP 99/64 | HR 95

## 2015-12-30 DIAGNOSIS — Z79899 Other long term (current) drug therapy: Secondary | ICD-10-CM | POA: Diagnosis not present

## 2015-12-30 DIAGNOSIS — G8254 Quadriplegia, C5-C7 incomplete: Secondary | ICD-10-CM | POA: Diagnosis present

## 2015-12-30 DIAGNOSIS — R200 Anesthesia of skin: Secondary | ICD-10-CM | POA: Insufficient documentation

## 2015-12-30 DIAGNOSIS — M47812 Spondylosis without myelopathy or radiculopathy, cervical region: Secondary | ICD-10-CM

## 2015-12-30 DIAGNOSIS — Z5181 Encounter for therapeutic drug level monitoring: Secondary | ICD-10-CM

## 2015-12-30 DIAGNOSIS — G4700 Insomnia, unspecified: Secondary | ICD-10-CM | POA: Insufficient documentation

## 2015-12-30 DIAGNOSIS — R011 Cardiac murmur, unspecified: Secondary | ICD-10-CM | POA: Diagnosis not present

## 2015-12-30 DIAGNOSIS — R252 Cramp and spasm: Secondary | ICD-10-CM | POA: Diagnosis not present

## 2015-12-30 DIAGNOSIS — M4802 Spinal stenosis, cervical region: Secondary | ICD-10-CM | POA: Diagnosis not present

## 2015-12-30 DIAGNOSIS — G8250 Quadriplegia, unspecified: Secondary | ICD-10-CM

## 2015-12-30 DIAGNOSIS — R531 Weakness: Secondary | ICD-10-CM | POA: Insufficient documentation

## 2015-12-30 DIAGNOSIS — S14105S Unspecified injury at C5 level of cervical spinal cord, sequela: Secondary | ICD-10-CM

## 2015-12-30 DIAGNOSIS — S14109S Unspecified injury at unspecified level of cervical spinal cord, sequela: Secondary | ICD-10-CM | POA: Diagnosis not present

## 2015-12-30 DIAGNOSIS — R51 Headache: Secondary | ICD-10-CM | POA: Insufficient documentation

## 2015-12-30 DIAGNOSIS — S14109A Unspecified injury at unspecified level of cervical spinal cord, initial encounter: Secondary | ICD-10-CM | POA: Diagnosis not present

## 2015-12-30 DIAGNOSIS — M5416 Radiculopathy, lumbar region: Secondary | ICD-10-CM

## 2015-12-30 DIAGNOSIS — R52 Pain, unspecified: Secondary | ICD-10-CM | POA: Insufficient documentation

## 2015-12-30 DIAGNOSIS — K219 Gastro-esophageal reflux disease without esophagitis: Secondary | ICD-10-CM | POA: Insufficient documentation

## 2015-12-30 DIAGNOSIS — N3289 Other specified disorders of bladder: Secondary | ICD-10-CM | POA: Diagnosis not present

## 2015-12-30 DIAGNOSIS — M4722 Other spondylosis with radiculopathy, cervical region: Secondary | ICD-10-CM

## 2015-12-30 MED ORDER — DARIFENACIN HYDROBROMIDE ER 15 MG PO TB24: 15.0000 mg | ORAL_TABLET | Freq: Every day | ORAL | 4 refills | Status: DC

## 2015-12-30 MED ORDER — BACLOFEN 20 MG PO TABS: 20.0000 mg | ORAL_TABLET | Freq: Four times a day (QID) | ORAL | 4 refills | Status: DC

## 2015-12-30 MED ORDER — HYDROCODONE-ACETAMINOPHEN 5-325 MG PO TABS: 1.0000 | ORAL_TABLET | Freq: Four times a day (QID) | ORAL | 0 refills | Status: DC | PRN
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
## 2015-12-30 NOTE — Patient Instructions (Signed)
TAKE BACLOFEN 20MG  3X DAILY FOR 3 DAYS THEN INCREASE TO 4X DAILY  STOP DANTRIUM. START ENABLEX  I'LL  CALL ABOUT YOUR MRI   PLEASE CALL ME WITH ANY PROBLEMS OR QUESTIONS [phone])

## 2015-12-31 ENCOUNTER — Ambulatory Visit
Admission: RE | Admit: 2015-12-31 | Discharge: 2015-12-31 | Disposition: A | Discharge status: Home / Self Care | Payer: BLUE CROSS/BLUE SHIELD | Source: Ambulatory Visit | Attending: Physical Medicine & Rehabilitation | Admitting: Physical Medicine & Rehabilitation

## 2015-12-31 ENCOUNTER — Telehealth: Payer: Self-pay | Admitting: Physical Medicine & Rehabilitation

## 2015-12-31 DIAGNOSIS — M5126 Other intervertebral disc displacement, lumbar region: Secondary | ICD-10-CM | POA: Insufficient documentation

## 2015-12-31 DIAGNOSIS — M5416 Radiculopathy, lumbar region: Secondary | ICD-10-CM | POA: Insufficient documentation

## 2015-12-31 DIAGNOSIS — M5136 Other intervertebral disc degeneration, lumbar region: Secondary | ICD-10-CM | POA: Diagnosis not present

## 2015-12-31 NOTE — Telephone Encounter (Signed)
Patient has been told by her pharmacy that Robaxin needs prior authorization.  Please call patient.

## 2016-01-01 ENCOUNTER — Telehealth: Payer: Self-pay | Admitting: *Deleted

## 2016-01-01 DIAGNOSIS — S14105S Unspecified injury at C5 level of cervical spinal cord, sequela: Secondary | ICD-10-CM

## 2016-01-01 MED ORDER — METHOCARBAMOL 500 MG PO TABS: ORAL_TABLET | ORAL | 3 refills | Status: DC

## 2016-01-01 NOTE — Telephone Encounter (Signed)
Air mattress overlay would be fine. thanks

## 2016-01-01 NOTE — Telephone Encounter (Signed)
Jasmine [name] called and said that her bed is killing her back.  She has talked to [facility] and they said that Dr Riley Kill can submit an order for an air mattress or something like it and they will process it.  Please advise

## 2016-01-01 NOTE — Telephone Encounter (Signed)
Received fax from pharmacy asking for refills.  Did not say prior auth.  Refills sent to pharmacy and pharmacy verified the medication went through.  I notified [employer]

## 2016-01-02 ENCOUNTER — Telehealth: Payer: Self-pay | Admitting: Physical Medicine & Rehabilitation

## 2016-01-02 DIAGNOSIS — A499 Bacterial infection, unspecified: Secondary | ICD-10-CM

## 2016-01-02 DIAGNOSIS — N390 Urinary tract infection, site not specified: Principal | ICD-10-CM

## 2016-01-02 NOTE — Telephone Encounter (Signed)
Patient thinks she has another UTI and she has called her urologist, but she hasn't heard back from them.  She would like to know if our office could call in something for her.

## 2016-01-02 NOTE — Telephone Encounter (Signed)
Would you like urology to take care of this, or send something in?

## 2016-01-02 NOTE — Telephone Encounter (Signed)
Order entered for air mattress overlay per ZS.

## 2016-01-03 MED ORDER — AMOXICILLIN 250 MG PO CAPS: 250.0000 mg | ORAL_CAPSULE | Freq: Three times a day (TID) | ORAL | 0 refills | Status: DC

## 2016-01-03 NOTE — Telephone Encounter (Signed)
Xarelto  would have to be stopped by  02/04/16 (off for 2 days) but it will have to be approved to stop by Riley Kill.

## 2016-01-03 NOTE — Telephone Encounter (Signed)
Advised pt about providing a sample before abx treatment. She states that she is unable to come because her husband is at work. Per ET, I explained to pt that we can not randomly treat UTI's with abx without results confirming it. I advised her to either drop off a sample or follow up with urology on this. Also, pt would like call back regarding her MRI results.

## 2016-01-03 NOTE — Telephone Encounter (Signed)
I have scheduled patient with Dr. Wynn Banker for an injection on September 1 and she is on a blood thinner that Dr. Riley Kill prescribes.  Please let her know when she needs to come off this medication.

## 2016-01-03 NOTE — Telephone Encounter (Signed)
I would like for her to provide a sample to send to the lab rather than just "shot gun" treatment

## 2016-01-03 NOTE — Telephone Encounter (Signed)
This can be stopped on 8/29 and resumed after the injection.  thanks

## 2016-01-03 NOTE — Telephone Encounter (Signed)
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 There is mild to moderate central canal narrowing overall at this level. Minimal disc bulge L1-2 without central canal or foraminal stenosis.  She would like to proceed with translaminar ESI at [facility] with Dr. Carollee Leitz  I sent in an RX for amoxil as she cannot get in today---she will provide a sample next week if symptoms continue despite rx

## 2016-01-06 ENCOUNTER — Other Ambulatory Visit: Payer: Self-pay

## 2016-01-06 MED ORDER — PREGABALIN 75 MG PO CAPS: ORAL_CAPSULE | ORAL | 0 refills | Status: DC

## 2016-01-06 NOTE — Telephone Encounter (Signed)
Pt was advised to stop Xarelto on 02/04/16 prior to injection. Pt was also asking about the EMPI stim for LUE muscle tone/spasticity. Please advise?

## 2016-01-06 NOTE — Telephone Encounter (Signed)
Pt was short on meds due to an error on the last rx. Called in the proper sig and amount of Lyrica.

## 2016-01-06 NOTE — Telephone Encounter (Deleted)
I thought we were contacting

## 2016-01-07 ENCOUNTER — Telehealth: Payer: Self-pay

## 2016-01-07 MED ORDER — PREGABALIN 150 MG PO CAPS: 150.0000 mg | ORAL_CAPSULE | Freq: Two times a day (BID) | ORAL | 4 refills | Status: DC

## 2016-01-07 NOTE — Telephone Encounter (Signed)
Lyrica 150mg  BID was called into the pharmacy per ZS.

## 2016-01-07 NOTE — Telephone Encounter (Signed)
Jasmine [name]- OT with [facility] at [employer] requesting verbal orders for 1wk2. Verbal orders approved per office protocol.

## 2016-01-07 NOTE — Telephone Encounter (Signed)
Pt called in regards to Lyrica. I see in the last OV note on 12/30/15, there was a mention about the Lyrica being 75mg  qam and 150mg  qhs. However, there was a telephone call on 12/11/15 stating that you increased the Lyrica to 150mg  BID. Please advise on which sig you would like me to call in?

## 2016-01-07 NOTE — Telephone Encounter (Signed)
I spoke with Jasmine [name]. I would like her to be on 150mg  BID. #[id number]  On another note-----She will check with insurance to see if she has coverage for a neuromuscular stimulator. If so, she'll let us know if a certain brand is covered. We could then write an order and request through the vendor. If she does not have coverage, she is probably best off trying to buy directly on her own.

## 2016-01-13 ENCOUNTER — Telehealth: Payer: Self-pay | Admitting: Physical Medicine & Rehabilitation

## 2016-01-13 NOTE — Telephone Encounter (Signed)
Morrie Sheldonshley with [facility] needs a call back about patients supplies, her number is [phone] [phone].  She faxed a form over to our old fax number.

## 2016-01-14 ENCOUNTER — Telehealth: Payer: Self-pay | Admitting: Physical Medicine & Rehabilitation

## 2016-01-14 ENCOUNTER — Other Ambulatory Visit: Payer: Self-pay | Admitting: *Deleted

## 2016-01-14 MED ORDER — RIVAROXABAN 10 MG PO TABS: 10.0000 mg | ORAL_TABLET | Freq: Every day | ORAL | 0 refills | Status: DC

## 2016-01-14 NOTE — Telephone Encounter (Signed)
Reordered for 30 days. Riley KillSwartz office note says 12 weeks.  Rx initiated 11/15/15 at d/c

## 2016-01-14 NOTE — Telephone Encounter (Signed)
Left message for Morrie Sheldonshley with our new fax number

## 2016-01-14 NOTE — Telephone Encounter (Signed)
Patient needing a refill on her Xarelto.  [facility]

## 2016-01-20 ENCOUNTER — Telehealth: Payer: Self-pay | Admitting: *Deleted

## 2016-01-20 NOTE — Telephone Encounter (Signed)
Per Dr Riley KillSwartz this was an error, had asked about follow up and it was not in reference to [place].

## 2016-01-20 NOTE — Telephone Encounter (Signed)
Left message for [name] to call the office to follow up on UTI addressed previously

## 2016-01-21 ENCOUNTER — Telehealth: Payer: Self-pay | Admitting: Physical Medicine & Rehabilitation

## 2016-01-21 NOTE — Telephone Encounter (Signed)
Orders placed on Dr Riley KillSwartz desk this morning to be signed by him.

## 2016-01-21 NOTE — Telephone Encounter (Signed)
Jasmine [name] with [facility] needs authorization for supplies for patient and also about a form she had faxed over.  Please call her at [phone].

## 2016-01-24 ENCOUNTER — Telehealth: Payer: Self-pay | Admitting: Physical Medicine & Rehabilitation

## 2016-01-24 NOTE — Telephone Encounter (Signed)
[facility]

## 2016-01-24 NOTE — Telephone Encounter (Signed)
Jasmine [name] with [facility] needs to get patients supplies ordered.  Please call her at [phone].

## 2016-01-29 ENCOUNTER — Encounter: Payer: Self-pay | Admitting: Physical Medicine & Rehabilitation

## 2016-01-29 ENCOUNTER — Encounter
Payer: BLUE CROSS/BLUE SHIELD | Attending: Physical Medicine & Rehabilitation | Admitting: Physical Medicine & Rehabilitation

## 2016-01-29 DIAGNOSIS — M4722 Other spondylosis with radiculopathy, cervical region: Secondary | ICD-10-CM

## 2016-01-29 DIAGNOSIS — G4700 Insomnia, unspecified: Secondary | ICD-10-CM | POA: Insufficient documentation

## 2016-01-29 DIAGNOSIS — Z5181 Encounter for therapeutic drug level monitoring: Secondary | ICD-10-CM | POA: Diagnosis not present

## 2016-01-29 DIAGNOSIS — R531 Weakness: Secondary | ICD-10-CM | POA: Insufficient documentation

## 2016-01-29 DIAGNOSIS — S14109S Unspecified injury at unspecified level of cervical spinal cord, sequela: Secondary | ICD-10-CM | POA: Diagnosis not present

## 2016-01-29 DIAGNOSIS — R200 Anesthesia of skin: Secondary | ICD-10-CM | POA: Insufficient documentation

## 2016-01-29 DIAGNOSIS — Z79899 Other long term (current) drug therapy: Secondary | ICD-10-CM

## 2016-01-29 DIAGNOSIS — G8254 Quadriplegia, C5-C7 incomplete: Secondary | ICD-10-CM | POA: Insufficient documentation

## 2016-01-29 DIAGNOSIS — R252 Cramp and spasm: Secondary | ICD-10-CM | POA: Insufficient documentation

## 2016-01-29 DIAGNOSIS — G8250 Quadriplegia, unspecified: Secondary | ICD-10-CM

## 2016-01-29 DIAGNOSIS — M4802 Spinal stenosis, cervical region: Secondary | ICD-10-CM

## 2016-01-29 DIAGNOSIS — S14109A Unspecified injury at unspecified level of cervical spinal cord, initial encounter: Secondary | ICD-10-CM | POA: Diagnosis not present

## 2016-01-29 DIAGNOSIS — R52 Pain, unspecified: Secondary | ICD-10-CM | POA: Diagnosis present

## 2016-01-29 DIAGNOSIS — K219 Gastro-esophageal reflux disease without esophagitis: Secondary | ICD-10-CM | POA: Insufficient documentation

## 2016-01-29 DIAGNOSIS — R51 Headache: Secondary | ICD-10-CM | POA: Diagnosis not present

## 2016-01-29 DIAGNOSIS — M47812 Spondylosis without myelopathy or radiculopathy, cervical region: Secondary | ICD-10-CM

## 2016-01-29 DIAGNOSIS — S14105S Unspecified injury at C5 level of cervical spinal cord, sequela: Secondary | ICD-10-CM

## 2016-01-29 DIAGNOSIS — N3289 Other specified disorders of bladder: Secondary | ICD-10-CM | POA: Insufficient documentation

## 2016-01-29 DIAGNOSIS — R011 Cardiac murmur, unspecified: Secondary | ICD-10-CM | POA: Diagnosis not present

## 2016-01-29 DIAGNOSIS — M5416 Radiculopathy, lumbar region: Secondary | ICD-10-CM

## 2016-01-29 MED ORDER — HYDROCODONE-ACETAMINOPHEN 5-325 MG PO TABS: 1.0000 | ORAL_TABLET | Freq: Four times a day (QID) | ORAL | 0 refills | Status: DC | PRN

## 2016-01-29 MED ORDER — NORTRIPTYLINE HCL 25 MG PO CAPS: 25.0000 mg | ORAL_CAPSULE | Freq: Every day | ORAL | 4 refills | Status: DC

## 2016-01-29 MED ORDER — DARIFENACIN HYDROBROMIDE ER 15 MG PO TB24: 30.0000 mg | ORAL_TABLET | Freq: Every day | ORAL | 4 refills | Status: DC

## 2016-01-29 MED ORDER — TIZANIDINE HCL 2 MG PO CAPS: 2.0000 mg | ORAL_CAPSULE | Freq: Three times a day (TID) | ORAL | 3 refills | Status: DC

## 2016-01-29 NOTE — Patient Instructions (Signed)
USE A RAG FOR YOUR LEFT HAND AT NIGHT UNTIL WE DO BOTOX   PLEASE CALL ME WITH ANY PROBLEMS OR QUESTIONS [phone])
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
## 2016-01-30 ENCOUNTER — Telehealth: Payer: Self-pay | Admitting: Physical Medicine & Rehabilitation

## 2016-01-30 ENCOUNTER — Telehealth: Payer: Self-pay

## 2016-01-30 ENCOUNTER — Ambulatory Visit: Payer: BLUE CROSS/BLUE SHIELD | Admitting: Registered Nurse

## 2016-01-30 NOTE — Telephone Encounter (Signed)
Pt's husband called stating that the enablex is not covered to take BID. There is a quantity limit. Would you like us to try to override with a PA or change rx? Please advise.

## 2016-01-30 NOTE — Telephone Encounter (Signed)
Jasmine [name] RodGave Jasmine [name] a call back. Left message advising that the rx was correct. Also advised that if he had anymore questions or concerns, give office a call.

## 2016-01-30 NOTE — Telephone Encounter (Signed)
Need to check on patients Enablex prescription that was given to them.  He thought that it was supposed to be increased, please call husband.

## 2016-01-31 ENCOUNTER — Telehealth: Payer: Self-pay | Admitting: Physical Medicine & Rehabilitation

## 2016-01-31 NOTE — Telephone Encounter (Signed)
[facility] to explain why he wants 30 mg and then we can proceed.

## 2016-01-31 NOTE — Telephone Encounter (Signed)
Sherry Nurse Case Manger with [employer] needs something submitted to them with why the quantity increase is needed for Enablex. Patient is down to one pill and will need this.   If you have any questions please call her at [phone].

## 2016-01-31 NOTE — Telephone Encounter (Signed)
It's not written bid. It's written 2 qday. Can we pursue override? thanks

## 2016-02-03 NOTE — Telephone Encounter (Signed)
The reason for increase is in my progress note: Continued, severe bladder spasms due to her SCI

## 2016-02-07 ENCOUNTER — Encounter: Payer: BLUE CROSS/BLUE SHIELD | Attending: Physical Medicine & Rehabilitation

## 2016-02-07 ENCOUNTER — Telehealth: Payer: Self-pay | Admitting: *Deleted

## 2016-02-07 ENCOUNTER — Ambulatory Visit (HOSPITAL_BASED_OUTPATIENT_CLINIC_OR_DEPARTMENT_OTHER): Payer: BLUE CROSS/BLUE SHIELD | Admitting: Physical Medicine & Rehabilitation

## 2016-02-07 ENCOUNTER — Encounter: Payer: Self-pay | Admitting: Physical Medicine & Rehabilitation

## 2016-02-07 VITALS — BP 120/80 | HR 89

## 2016-02-07 DIAGNOSIS — G8254 Quadriplegia, C5-C7 incomplete: Secondary | ICD-10-CM | POA: Insufficient documentation

## 2016-02-07 DIAGNOSIS — K219 Gastro-esophageal reflux disease without esophagitis: Secondary | ICD-10-CM | POA: Diagnosis not present

## 2016-02-07 DIAGNOSIS — R51 Headache: Secondary | ICD-10-CM | POA: Diagnosis not present

## 2016-02-07 DIAGNOSIS — R252 Cramp and spasm: Secondary | ICD-10-CM | POA: Diagnosis not present

## 2016-02-07 DIAGNOSIS — G4700 Insomnia, unspecified: Secondary | ICD-10-CM | POA: Diagnosis not present

## 2016-02-07 DIAGNOSIS — N3289 Other specified disorders of bladder: Secondary | ICD-10-CM | POA: Insufficient documentation

## 2016-02-07 DIAGNOSIS — R531 Weakness: Secondary | ICD-10-CM | POA: Insufficient documentation

## 2016-02-07 DIAGNOSIS — R52 Pain, unspecified: Secondary | ICD-10-CM | POA: Diagnosis present

## 2016-02-07 DIAGNOSIS — S14109A Unspecified injury at unspecified level of cervical spinal cord, initial encounter: Secondary | ICD-10-CM | POA: Diagnosis not present

## 2016-02-07 DIAGNOSIS — R011 Cardiac murmur, unspecified: Secondary | ICD-10-CM | POA: Diagnosis not present

## 2016-02-07 DIAGNOSIS — R200 Anesthesia of skin: Secondary | ICD-10-CM | POA: Insufficient documentation

## 2016-02-07 DIAGNOSIS — M5416 Radiculopathy, lumbar region: Secondary | ICD-10-CM

## 2016-02-07 NOTE — Telephone Encounter (Signed)
Pt came in for her translaminar injection today. She did well despite her pain.  Husband asked me to send a communique to Dr. Riley KillSwartz informing that the muscle relaxer is not helping (tizanidine 2mg  TID).  They were told (they say) to inform Dr. Riley KillSwartz if the medication was effective or not prior to their upcoming appt so that adjustments could be made

## 2016-02-07 NOTE — Progress Notes (Signed)
  PROCEDURE RECORD [name]   Name: Jasmine [name] DOB:17-Aug-1975 MRN: [id number]  Date:02/07/2016  Physician: Claudette LawsAndrew Kirsteins, MD    Nurse/CMA: Shyrl Obi, CMA  Allergies:  Allergies  Allergen Reactions  . Neurontin [Gabapentin] Other (See Comments)    Visual changes and heart racing.    Consent Signed: Yes.    Is patient diabetic? No.  CBG today?   Pregnant: No. LMP: No LMP recorded. (age 41-55)  Anticoagulants: no Anti-inflammatory: no Antibiotics: no  Procedure: translaminar epidural steroid injection Position: Prone Start Time: 1:04pm  End Time: 1:09pm  Fluoro Time: 14  RN/MA Jacion Dismore, CMA Lezli Danek, CMA    Time 12:45pm 1:19pm    BP 120/80 145/87    Pulse 91 97    Respirations 14 14    O2 Sat 96 95 `   S/S 6 6    Pain Level 8/10 8/10     D/C home with husband, patient A & O X 3, D/C instructions reviewed, and sits independently.

## 2016-02-07 NOTE — Patient Instructions (Addendum)
You received an epidural steroid injection under fluoroscopic guidance. This is the most accurate way to perform an epidural injection. This injection was performed to relieve thigh or leg or foot pain that may be related to a pinched nerve in the lumbar spine. The local anesthetic injected today may cause numbness in your leg for a couple hours. If it is severe we may need to observe you for 30-60 minutes after the injection. The cortisone medicine injected today may take several days to take full effect. This medicine can also cause facial flushing or feeling of being warm.  This injection may last for days weeks or months. It can be repeated if needed. If it is not effective, another spinal level may need to be injected. Other treatments include medication management as well as physical therapy. In some cases surgery may be an option.  May resume Xarelto 9/2

## 2016-02-07 NOTE — Progress Notes (Signed)
Lumbar epidural steroid injection under fluoroscopic guidance  Indication: Lumbosacral radiculitis is not relieved by medication management or other conservative care and interfering with self-care and mobility.  No  anticoagulant use. Off Xarelto since 02/03/16.  Informed consent was obtained after describing risk and benefits of the procedure with the patient, this includes bleeding, bruising, infection, paralysis and medication side effects.  The patient wishes to proceed and has given written consent.  Patient was placed in a prone position.  The lumbar area was marked and prepped with Betadine.  It was entered with a 25-gauge 1-1/2 inch needle and one mL of 1% lidocaine was injected into the skin and subcutaneous tissue.  Then a 17-gauge spinal needle was inserted under fluoroscopic guidance into the Left L4-5 paramedian interlaminar space under AP and Lateral imaging.  Once needle tip of approximated the posterior elements, a loss of resistance technique was utilized with lateral imaging.  A positive loss of resistance was obtained and then confirmed by injecting 2 mL's of Omnipaque 180.  Then a solution containing 1.5 mL's of 6mg/ml Celestone and 1.5 mL's of 1% lidocaine was injected.  The patient tolerated procedure well.  Post procedure instructions were given.  Please see post procedure form. 

## 2016-02-11 ENCOUNTER — Other Ambulatory Visit: Payer: Self-pay | Admitting: Physical Medicine & Rehabilitation

## 2016-02-11 ENCOUNTER — Telehealth: Payer: Self-pay

## 2016-02-11 NOTE — Telephone Encounter (Signed)
Pt has a translaminar ESI on 02/07/16. She is still having severe pain and she states  "the tone in my leg is terrible." She would like to know what she can do.

## 2016-02-12 ENCOUNTER — Other Ambulatory Visit: Payer: Self-pay | Admitting: Physical Medicine & Rehabilitation

## 2016-02-12 MED ORDER — TIZANIDINE HCL 4 MG PO CAPS: 4.0000 mg | ORAL_CAPSULE | Freq: Three times a day (TID) | ORAL | 1 refills | Status: DC

## 2016-02-12 NOTE — Telephone Encounter (Signed)
Tried calling home and mobile number, busy signal for both.

## 2016-02-12 NOTE — Telephone Encounter (Signed)
Increase zanaflex to 4mg  TID for tone. We can try percocet for better pain control if she would like to (on hydrocodone now)

## 2016-02-12 NOTE — Telephone Encounter (Signed)
Pt advised. Will send new rx of 4mg  to pharmacy.

## 2016-02-18 ENCOUNTER — Encounter (HOSPITAL_BASED_OUTPATIENT_CLINIC_OR_DEPARTMENT_OTHER): Payer: BLUE CROSS/BLUE SHIELD | Admitting: Physical Medicine & Rehabilitation

## 2016-02-18 ENCOUNTER — Encounter: Payer: Self-pay | Admitting: Physical Medicine & Rehabilitation

## 2016-02-18 DIAGNOSIS — G8250 Quadriplegia, unspecified: Secondary | ICD-10-CM

## 2016-02-18 DIAGNOSIS — M4802 Spinal stenosis, cervical region: Secondary | ICD-10-CM

## 2016-02-18 DIAGNOSIS — G8254 Quadriplegia, C5-C7 incomplete: Secondary | ICD-10-CM | POA: Diagnosis not present

## 2016-02-18 DIAGNOSIS — Z5181 Encounter for therapeutic drug level monitoring: Secondary | ICD-10-CM

## 2016-02-18 DIAGNOSIS — M4722 Other spondylosis with radiculopathy, cervical region: Secondary | ICD-10-CM

## 2016-02-18 DIAGNOSIS — S14109S Unspecified injury at unspecified level of cervical spinal cord, sequela: Secondary | ICD-10-CM

## 2016-02-18 DIAGNOSIS — Z79899 Other long term (current) drug therapy: Secondary | ICD-10-CM | POA: Diagnosis not present

## 2016-02-18 DIAGNOSIS — S14105S Unspecified injury at C5 level of cervical spinal cord, sequela: Secondary | ICD-10-CM

## 2016-02-18 DIAGNOSIS — M5416 Radiculopathy, lumbar region: Secondary | ICD-10-CM

## 2016-02-18 DIAGNOSIS — M47812 Spondylosis without myelopathy or radiculopathy, cervical region: Secondary | ICD-10-CM

## 2016-02-18 MED ORDER — OXYCODONE-ACETAMINOPHEN 10-325 MG PO TABS: 1.0000 | ORAL_TABLET | Freq: Four times a day (QID) | ORAL | 0 refills | Status: DC | PRN

## 2016-02-18 MED ORDER — CLONAZEPAM 1 MG PO TABS: 0.5000 mg | ORAL_TABLET | Freq: Two times a day (BID) | ORAL | 3 refills | Status: DC

## 2016-02-18 MED ORDER — HYDROCODONE-ACETAMINOPHEN 5-325 MG PO TABS: 1.0000 | ORAL_TABLET | Freq: Four times a day (QID) | ORAL | 0 refills | Status: DC | PRN

## 2016-02-18 NOTE — Progress Notes (Signed)
Botox Injection for spasticity using needle EMG guidance Indication: spastic tetraplegia   Dilution: 100 Units/ml        Total Units Injected: 300 units Indication: Severe spasticity which interferes with ADL,mobility and/or  hygiene and is unresponsive to medication management and other conservative care Informed consent was obtained after describing risks and benefits of the procedure with the patient. This includes bleeding, bruising, infection, excessive weakness, or medication side effects. A REMS form is on file and signed.  Needle: 50mm injectable monopolar needle electrode  Number of units per muscle   Left FDP/FDS 150 units Left FCR 25 units Left FCU 25 units Left pectoralis major 100 units  All injections were done after obtaining appropriate EMG activity and after negative drawback for blood. The patient tolerated the procedure well. Post procedure instructions were given. Return in about 6 weeks (around 03/31/2016).   Will change hydrocodone to percocet for pain relief, 10/325 one q6 prn  Add klonopin 0.5 to 1mg  q12 for better tone,spasm control  Follow up in about 6 weeks.

## 2016-02-18 NOTE — Patient Instructions (Signed)
PLEASE CALL ME WITH ANY PROBLEMS OR QUESTIONS ([phone])  

## 2016-02-26 ENCOUNTER — Telehealth: Payer: Self-pay | Admitting: Physical Medicine & Rehabilitation

## 2016-02-26 NOTE — Telephone Encounter (Signed)
Tobi Bastosnna with [facility] is needing a call back about patient's prescription for Norco #150 because patient was just given Percocet #120 filled on September 12.  Please call her at [phone].

## 2016-02-26 NOTE — Telephone Encounter (Signed)
Patient called stating that she had had an injection and it been a week and her muscles had not released and she was having some discomfort - wanteed to come in and see dr Riley KillSWARTZ for another appt or injection - -   Spoke to dr Riley KillSWARTZ - he stated for the pt to wait until next week, some patients taker longer for the meds to work than others - if she is still uncomfortable or it hasnt worked to call back and he will see about adjusting her meds at that time  Called patient and explained this to her - patient gave verbal understanding

## 2016-02-27 NOTE — Telephone Encounter (Signed)
Per Dr Riley KillSwartz note on 02/18/16 visit "Will change hydrocodone to percocet for pain relief, 10/325 one q6 prn".  [facility] to [facility] Norco since she filled her Percocet Rx 02/18/16.

## 2016-03-02 ENCOUNTER — Telehealth: Payer: Self-pay | Admitting: Physical Medicine & Rehabilitation

## 2016-03-02 NOTE — Telephone Encounter (Signed)
Apolinar JunesBrandon is calling to let us know that the patient's legs are swelling from knee caps on both legs--he has been elevating them both.  They are hard to bend because of it.  He would like to know what can he do.  Please call  Husband at [phone].

## 2016-03-03 ENCOUNTER — Other Ambulatory Visit: Payer: Self-pay | Admitting: Physical Medicine & Rehabilitation

## 2016-03-04 NOTE — Telephone Encounter (Signed)
ACE wrap both legs starting with high pressure and gradually decreasing pressure to above knee.  Reapply as needed. Continue elevation

## 2016-03-04 NOTE — Telephone Encounter (Signed)
Contacted patient and passed on Dr. Rosalyn ChartersSwartz's recommendations

## 2016-03-09 ENCOUNTER — Other Ambulatory Visit: Payer: Self-pay | Admitting: Physical Medicine & Rehabilitation

## 2016-03-10 ENCOUNTER — Telehealth: Payer: Self-pay | Admitting: Physical Medicine & Rehabilitation

## 2016-03-10 NOTE — Telephone Encounter (Signed)
Patient needs a call back about her Botox injection she received.  Her swelling in knees hasn't gotten any better also and needs to know what to do.  Also requesting a refill on Nortriptyline.

## 2016-03-12 ENCOUNTER — Telehealth: Payer: Self-pay

## 2016-03-12 NOTE — Telephone Encounter (Signed)
Rx request Nortriptyline 25mg  #90 Start Nortriptyline 01/29/16 Switch from #30 to #90  Last OV 02/18/16 Next appt 03/23/2016

## 2016-03-13 MED ORDER — NORTRIPTYLINE HCL 25 MG PO CAPS: 25.0000 mg | ORAL_CAPSULE | Freq: Every day | ORAL | 4 refills | Status: DC

## 2016-03-13 NOTE — Telephone Encounter (Signed)
Recommended increase of klonopin to 1mg  bid ACE wraps to legs with increased pressure.

## 2016-03-13 NOTE — Addendum Note (Signed)
Addended by: Faith RogueSWARTZ, Angelli Baruch T on: 03/13/2016 12:47 PM   Modules accepted: Orders

## 2016-03-18 ENCOUNTER — Other Ambulatory Visit: Payer: Self-pay | Admitting: Physical Medicine & Rehabilitation

## 2016-03-18 ENCOUNTER — Telehealth: Payer: Self-pay | Admitting: Physical Medicine & Rehabilitation

## 2016-03-18 DIAGNOSIS — M5416 Radiculopathy, lumbar region: Secondary | ICD-10-CM

## 2016-03-18 DIAGNOSIS — Z5181 Encounter for therapeutic drug level monitoring: Secondary | ICD-10-CM

## 2016-03-18 DIAGNOSIS — G8250 Quadriplegia, unspecified: Secondary | ICD-10-CM

## 2016-03-18 DIAGNOSIS — S14105S Unspecified injury at C5 level of cervical spinal cord, sequela: Secondary | ICD-10-CM

## 2016-03-18 DIAGNOSIS — M4722 Other spondylosis with radiculopathy, cervical region: Secondary | ICD-10-CM

## 2016-03-18 DIAGNOSIS — M4802 Spinal stenosis, cervical region: Secondary | ICD-10-CM

## 2016-03-18 MED ORDER — BACLOFEN 20 MG PO TABS: 20.0000 mg | ORAL_TABLET | Freq: Four times a day (QID) | ORAL | 4 refills | Status: DC

## 2016-03-18 MED ORDER — OXYCODONE-ACETAMINOPHEN 10-325 MG PO TABS: 1.0000 | ORAL_TABLET | Freq: Four times a day (QID) | ORAL | 0 refills | Status: DC | PRN

## 2016-03-18 NOTE — Telephone Encounter (Signed)
Patient is stating she is out of her oxycodone as of today. Needs a refill please call patient.

## 2016-03-18 NOTE — Telephone Encounter (Signed)
Jesica's appt falls after her due to fill date (appt 03/23/16) and her last rx was 02/18/16.  I have printed an RX for Dr Riley KillSwartz to sign.

## 2016-03-18 NOTE — Telephone Encounter (Signed)
Notified RX available

## 2016-03-21 ENCOUNTER — Other Ambulatory Visit: Payer: Self-pay | Admitting: Physical Medicine & Rehabilitation

## 2016-03-23 ENCOUNTER — Encounter: Payer: Self-pay | Admitting: Physical Medicine & Rehabilitation

## 2016-03-23 ENCOUNTER — Telehealth: Payer: Self-pay | Admitting: Physical Medicine & Rehabilitation

## 2016-03-23 ENCOUNTER — Encounter
Payer: BLUE CROSS/BLUE SHIELD | Attending: Physical Medicine & Rehabilitation | Admitting: Physical Medicine & Rehabilitation

## 2016-03-23 VITALS — BP 120/84 | HR 103

## 2016-03-23 DIAGNOSIS — N3289 Other specified disorders of bladder: Secondary | ICD-10-CM | POA: Diagnosis not present

## 2016-03-23 DIAGNOSIS — R531 Weakness: Secondary | ICD-10-CM | POA: Diagnosis not present

## 2016-03-23 DIAGNOSIS — R51 Headache: Secondary | ICD-10-CM | POA: Insufficient documentation

## 2016-03-23 DIAGNOSIS — M5416 Radiculopathy, lumbar region: Secondary | ICD-10-CM | POA: Diagnosis not present

## 2016-03-23 DIAGNOSIS — S14109A Unspecified injury at unspecified level of cervical spinal cord, initial encounter: Secondary | ICD-10-CM | POA: Insufficient documentation

## 2016-03-23 DIAGNOSIS — R200 Anesthesia of skin: Secondary | ICD-10-CM | POA: Insufficient documentation

## 2016-03-23 DIAGNOSIS — R011 Cardiac murmur, unspecified: Secondary | ICD-10-CM | POA: Insufficient documentation

## 2016-03-23 DIAGNOSIS — G8254 Quadriplegia, C5-C7 incomplete: Secondary | ICD-10-CM | POA: Diagnosis not present

## 2016-03-23 DIAGNOSIS — R252 Cramp and spasm: Secondary | ICD-10-CM | POA: Diagnosis not present

## 2016-03-23 DIAGNOSIS — S14105S Unspecified injury at C5 level of cervical spinal cord, sequela: Secondary | ICD-10-CM

## 2016-03-23 DIAGNOSIS — R52 Pain, unspecified: Secondary | ICD-10-CM | POA: Insufficient documentation

## 2016-03-23 DIAGNOSIS — G8220 Paraplegia, unspecified: Secondary | ICD-10-CM | POA: Diagnosis not present

## 2016-03-23 DIAGNOSIS — G4700 Insomnia, unspecified: Secondary | ICD-10-CM | POA: Insufficient documentation

## 2016-03-23 DIAGNOSIS — M4722 Other spondylosis with radiculopathy, cervical region: Secondary | ICD-10-CM

## 2016-03-23 DIAGNOSIS — M4802 Spinal stenosis, cervical region: Secondary | ICD-10-CM

## 2016-03-23 DIAGNOSIS — Z5181 Encounter for therapeutic drug level monitoring: Secondary | ICD-10-CM

## 2016-03-23 DIAGNOSIS — N319 Neuromuscular dysfunction of bladder, unspecified: Secondary | ICD-10-CM

## 2016-03-23 DIAGNOSIS — G8250 Quadriplegia, unspecified: Secondary | ICD-10-CM

## 2016-03-23 DIAGNOSIS — K219 Gastro-esophageal reflux disease without esophagitis: Secondary | ICD-10-CM | POA: Insufficient documentation

## 2016-03-23 MED ORDER — CLONAZEPAM 1 MG PO TABS: 1.0000 mg | ORAL_TABLET | Freq: Three times a day (TID) | ORAL | 3 refills | Status: DC

## 2016-03-23 MED ORDER — OXYCODONE-ACETAMINOPHEN 10-325 MG PO TABS: 1.0000 | ORAL_TABLET | Freq: Four times a day (QID) | ORAL | 0 refills | Status: DC | PRN

## 2016-03-23 NOTE — Telephone Encounter (Signed)
We can then wait on PT right now  Botox would not be before 12/12---hence, I didn't schedule today given that I ordered an ESI.

## 2016-03-23 NOTE — Telephone Encounter (Signed)
Patients husband was calling to inform SWARTZ that she only get 30 PT visits form 03/08/2016 - 03/07/2017 - they dont start at the beginning of the year.  Also had some questions about when the next botox should be??  The patient was scheduled for an injection with KIRSTEINS, and doesn't have an upcoming botox injection.

## 2016-03-23 NOTE — Patient Instructions (Signed)
PLEASE CALL ME WITH ANY PROBLEMS OR QUESTIONS ([phone])  
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
## 2016-03-24 NOTE — Telephone Encounter (Signed)
I explained to Mr [name] that when [name] [facility], she will follow up with Dr Riley KillSwartz the following month and that is how she gets back on Dr Riley KillSwartz' schedule )they were concerned), and I gave them the information about holding off on PT for now.

## 2016-03-31 ENCOUNTER — Telehealth: Payer: Self-pay | Admitting: *Deleted

## 2016-03-31 NOTE — Telephone Encounter (Signed)
Patients husband left a message stating that spasticity medications are not helping, patient is still not able to bend legs.  He is asking what else can be done to help patients situation

## 2016-04-03 NOTE — Telephone Encounter (Signed)
She needs to continue working on ROM.   We can try dantrium 25mg  BID #60, 3RF for tone also

## 2016-04-07 MED ORDER — DANTROLENE SODIUM 25 MG PO CAPS: 25.0000 mg | ORAL_CAPSULE | Freq: Two times a day (BID) | ORAL | 3 refills | Status: DC

## 2016-04-07 NOTE — Telephone Encounter (Signed)
Left message with husbands voicemail that medication has been sent to pharmacy and to continue ROM.

## 2016-04-15 ENCOUNTER — Telehealth: Payer: Self-pay | Admitting: Physical Medicine & Rehabilitation

## 2016-04-20 ENCOUNTER — Ambulatory Visit (HOSPITAL_BASED_OUTPATIENT_CLINIC_OR_DEPARTMENT_OTHER): Payer: BLUE CROSS/BLUE SHIELD | Admitting: Physical Medicine & Rehabilitation

## 2016-04-20 ENCOUNTER — Encounter: Payer: Self-pay | Admitting: Physical Medicine & Rehabilitation

## 2016-04-20 ENCOUNTER — Encounter: Payer: BLUE CROSS/BLUE SHIELD | Attending: Physical Medicine & Rehabilitation

## 2016-04-20 DIAGNOSIS — R531 Weakness: Secondary | ICD-10-CM | POA: Diagnosis not present

## 2016-04-20 DIAGNOSIS — S14105S Unspecified injury at C5 level of cervical spinal cord, sequela: Secondary | ICD-10-CM

## 2016-04-20 DIAGNOSIS — S14109A Unspecified injury at unspecified level of cervical spinal cord, initial encounter: Secondary | ICD-10-CM | POA: Diagnosis not present

## 2016-04-20 DIAGNOSIS — Z5181 Encounter for therapeutic drug level monitoring: Secondary | ICD-10-CM

## 2016-04-20 DIAGNOSIS — G8254 Quadriplegia, C5-C7 incomplete: Secondary | ICD-10-CM | POA: Diagnosis not present

## 2016-04-20 DIAGNOSIS — G4700 Insomnia, unspecified: Secondary | ICD-10-CM | POA: Insufficient documentation

## 2016-04-20 DIAGNOSIS — R011 Cardiac murmur, unspecified: Secondary | ICD-10-CM | POA: Insufficient documentation

## 2016-04-20 DIAGNOSIS — N3289 Other specified disorders of bladder: Secondary | ICD-10-CM | POA: Diagnosis not present

## 2016-04-20 DIAGNOSIS — R51 Headache: Secondary | ICD-10-CM | POA: Insufficient documentation

## 2016-04-20 DIAGNOSIS — R200 Anesthesia of skin: Secondary | ICD-10-CM | POA: Insufficient documentation

## 2016-04-20 DIAGNOSIS — M4802 Spinal stenosis, cervical region: Secondary | ICD-10-CM

## 2016-04-20 DIAGNOSIS — R252 Cramp and spasm: Secondary | ICD-10-CM | POA: Insufficient documentation

## 2016-04-20 DIAGNOSIS — M4722 Other spondylosis with radiculopathy, cervical region: Secondary | ICD-10-CM

## 2016-04-20 DIAGNOSIS — R52 Pain, unspecified: Secondary | ICD-10-CM | POA: Insufficient documentation

## 2016-04-20 DIAGNOSIS — M5416 Radiculopathy, lumbar region: Secondary | ICD-10-CM | POA: Diagnosis not present

## 2016-04-20 DIAGNOSIS — K219 Gastro-esophageal reflux disease without esophagitis: Secondary | ICD-10-CM | POA: Insufficient documentation

## 2016-04-20 DIAGNOSIS — G8250 Quadriplegia, unspecified: Secondary | ICD-10-CM

## 2016-04-20 MED ORDER — OXYCODONE-ACETAMINOPHEN 10-325 MG PO TABS: 1.0000 | ORAL_TABLET | Freq: Four times a day (QID) | ORAL | 0 refills | Status: DC | PRN

## 2016-04-20 NOTE — Progress Notes (Signed)
  PROCEDURE RECORD [facility]   Name: Jasmine [name] DOB:[birth year] MRN: [id number]  Date:04/20/2016  Physician: Claudette LawsAndrew Kirsteins, MD    Nurse/CMA: Kainan Patty, CMA  Allergies:  Allergies  Allergen Reactions  . Neurontin [Gabapentin] Other (See Comments)    Visual changes and heart racing.    Consent Signed: Yes.    Is patient diabetic? No.  CBG today?   Pregnant: No. LMP: No LMP recorded. (age 41-55)  Anticoagulants: no Anti-inflammatory: no Antibiotics: no  Procedure: left L4-5 translaminar epidural steroid injection Position: Prone Start Time: 1:48pm  End Time: 1:53 pm  Fluoro Time: 15  RN/CMA Aslin Farinas, CMA Kwali Wrinkle, CMA    Time 1:20pm 2:04pm    BP 112/70 100/70    Pulse 92 89    Respirations 14 14    O2 Sat 94 95    S/S 6 6    Pain Level 6/10 4/10     D/C home with husband, patient A & O X 3, D/C instructions reviewed, and sits independently.

## 2016-04-20 NOTE — Patient Instructions (Signed)

## 2016-04-20 NOTE — Progress Notes (Signed)
Lumbar epidural steroid injection under fluoroscopic guidance  Indication: Lumbosacral radiculitis is not relieved by medication management or other conservative care and interfering with self-care and mobility.  No  anticoagulant use. Off Xarelto since 02/03/16.  Informed consent was obtained after describing risk and benefits of the procedure with the patient, this includes bleeding, bruising, infection, paralysis and medication side effects.  The patient wishes to proceed and has given written consent.  Patient was placed in a prone position.  The lumbar area was marked and prepped with Betadine.  It was entered with a 25-gauge 1-1/2 inch needle and one mL of 1% lidocaine was injected into the skin and subcutaneous tissue.  Then a 17-gauge spinal needle was inserted under fluoroscopic guidance into the Left L4-5 paramedian interlaminar space under AP and Lateral imaging.  Once needle tip of approximated the posterior elements, a loss of resistance technique was utilized with lateral imaging.  A positive loss of resistance was obtained and then confirmed by injecting 2 mL's of Omnipaque 180.  Then a solution containing 1.5 mL's of 6mg /ml Celestone and 1.5 mL's of 1% lidocaine was injected.  The patient tolerated procedure well.  Post procedure instructions were given.  Please see post procedure form.

## 2016-04-20 NOTE — Progress Notes (Deleted)
   Subjective:    Patient ID: Jasmine [name], female    DOB: 01-12-1976, 40 y.o.   MRN: [id number]  HPI    Review of Systems     Objective:   Physical Exam        Assessment & Plan:

## 2016-05-03 ENCOUNTER — Other Ambulatory Visit: Payer: Self-pay | Admitting: Physical Medicine & Rehabilitation

## 2016-05-07 ENCOUNTER — Emergency Department: Payer: BLUE CROSS/BLUE SHIELD

## 2016-05-07 ENCOUNTER — Emergency Department
Admission: EM | Admit: 2016-05-07 | Discharge: 2016-05-07 | Disposition: A | Discharge status: Home / Self Care | Payer: BLUE CROSS/BLUE SHIELD | Attending: Emergency Medicine | Admitting: Emergency Medicine

## 2016-05-07 ENCOUNTER — Telehealth: Payer: Self-pay | Admitting: *Deleted

## 2016-05-07 DIAGNOSIS — M25562 Pain in left knee: Secondary | ICD-10-CM | POA: Insufficient documentation

## 2016-05-07 DIAGNOSIS — K5900 Constipation, unspecified: Secondary | ICD-10-CM | POA: Diagnosis present

## 2016-05-07 DIAGNOSIS — K5903 Drug induced constipation: Secondary | ICD-10-CM

## 2016-05-07 DIAGNOSIS — K5909 Other constipation: Secondary | ICD-10-CM | POA: Diagnosis not present

## 2016-05-07 DIAGNOSIS — Z79899 Other long term (current) drug therapy: Secondary | ICD-10-CM | POA: Insufficient documentation

## 2016-05-07 LAB — CBC WITH DIFFERENTIAL/PLATELET
Basophils Absolute: 0.1 10*3/uL (ref 0–0.1)
Basophils Relative: 1 %
EOS ABS: 0.2 10*3/uL (ref 0–0.7)
EOS PCT: 3 %
HCT: 37.5 % (ref 35.0–47.0)
HEMOGLOBIN: 12.9 g/dL (ref 12.0–16.0)
LYMPHS ABS: 2.6 10*3/uL (ref 1.0–3.6)
LYMPHS PCT: 33 %
MCH: 28.3 pg (ref 26.0–34.0)
MCHC: 34.4 g/dL (ref 32.0–36.0)
MCV: 82.3 fL (ref 80.0–100.0)
MONOS PCT: 6 %
Monocytes Absolute: 0.5 10*3/uL (ref 0.2–0.9)
NEUTROS PCT: 57 %
Neutro Abs: 4.5 10*3/uL (ref 1.4–6.5)
Platelets: 223 10*3/uL (ref 150–440)
RBC: 4.55 MIL/uL (ref 3.80–5.20)
RDW: 13.8 % (ref 11.5–14.5)
WBC: 8 10*3/uL (ref 3.6–11.0)

## 2016-05-07 LAB — URINALYSIS COMPLETE WITH MICROSCOPIC (ARMC ONLY)
BILIRUBIN URINE: NEGATIVE
Bacteria, UA: NONE SEEN
GLUCOSE, UA: NEGATIVE mg/dL
HGB URINE DIPSTICK: NEGATIVE
KETONES UR: NEGATIVE mg/dL
LEUKOCYTES UA: NEGATIVE
NITRITE: NEGATIVE
PH: 7 (ref 5.0–8.0)
Protein, ur: NEGATIVE mg/dL
SPECIFIC GRAVITY, URINE: 1.013 (ref 1.005–1.030)

## 2016-05-07 LAB — COMPREHENSIVE METABOLIC PANEL
ALK PHOS: 66 U/L (ref 38–126)
ALT: 31 U/L (ref 14–54)
ANION GAP: 8 (ref 5–15)
AST: 28 U/L (ref 15–41)
Albumin: 3.6 g/dL (ref 3.5–5.0)
BUN: 13 mg/dL (ref 6–20)
CALCIUM: 9.7 mg/dL (ref 8.9–10.3)
CO2: 30 mmol/L (ref 22–32)
CREATININE: 0.53 mg/dL (ref 0.44–1.00)
Chloride: 101 mmol/L (ref 101–111)
Glucose, Bld: 100 mg/dL — ABNORMAL HIGH (ref 65–99)
Potassium: 4.1 mmol/L (ref 3.5–5.1)
SODIUM: 139 mmol/L (ref 135–145)
TOTAL PROTEIN: 6.7 g/dL (ref 6.5–8.1)
Total Bilirubin: 0.3 mg/dL (ref 0.3–1.2)

## 2016-05-07 LAB — PREGNANCY, URINE: Preg Test, Ur: NEGATIVE

## 2016-05-07 MED ORDER — POLYETHYLENE GLYCOL 3350 17 G PO PACK: 17.0000 g | PACK | Freq: Every day | ORAL | 0 refills | Status: DC

## 2016-05-07 MED ORDER — OXYCODONE-ACETAMINOPHEN 5-325 MG PO TABS: 1.0000 | ORAL_TABLET | Freq: Once | ORAL | Status: DC

## 2016-05-07 MED ORDER — BACLOFEN 10 MG PO TABS
ORAL_TABLET | ORAL | Status: AC
  Administered 2016-05-07: 20 mg via ORAL
  Filled 2016-05-07: qty 2

## 2016-05-07 MED ORDER — METHYLNALTREXONE BROMIDE 12 MG/0.6ML ~~LOC~~ SOLN
12.0000 mg | Freq: Once | SUBCUTANEOUS | Status: AC
  Administered 2016-05-07: 12 mg via SUBCUTANEOUS
  Filled 2016-05-07: qty 0.6

## 2016-05-07 MED ORDER — BACLOFEN 20 MG PO TABS
20.0000 mg | ORAL_TABLET | Freq: Once | ORAL | Status: AC
  Administered 2016-05-07: 20 mg via ORAL
  Filled 2016-05-07: qty 1

## 2016-05-07 MED ORDER — OXYCODONE HCL 5 MG PO TABS
ORAL_TABLET | ORAL | Status: AC
  Administered 2016-05-07: 5 mg via ORAL
  Filled 2016-05-07: qty 1

## 2016-05-07 MED ORDER — FLEET ENEMA 7-19 GM/118ML RE ENEM
1.0000 | ENEMA | Freq: Once | RECTAL | Status: AC
  Administered 2016-05-07: 1 via RECTAL

## 2016-05-07 MED ORDER — SENNA 8.6 MG PO TABS: 2.0000 | ORAL_TABLET | Freq: Every day | ORAL | 0 refills | Status: DC

## 2016-05-07 MED ORDER — OXYCODONE HCL 5 MG PO TABS
5.0000 mg | ORAL_TABLET | Freq: Once | ORAL | Status: AC
  Administered 2016-05-07: 5 mg via ORAL

## 2016-05-07 MED ORDER — OXYCODONE-ACETAMINOPHEN 5-325 MG PO TABS
ORAL_TABLET | ORAL | Status: AC
  Filled 2016-05-07: qty 1

## 2016-05-07 MED ORDER — OXYCODONE-ACETAMINOPHEN 5-325 MG PO TABS
1.0000 | ORAL_TABLET | Freq: Once | ORAL | Status: AC
  Administered 2016-05-07: 17:00:00 via ORAL

## 2016-05-07 NOTE — Telephone Encounter (Signed)
Mr. [name] called stating his wife Jasmine [name]  having abdominal distention, no nausea and vomiting. She had small bowel movement yesterday. He voiced concern about wife's abdomen being distended, he performed rectal stimulation with no results. Instructed to go to ED for evaluation, Mr. [name] states his wife [name] not sure if she wants to go to ED. He was instructed to call with their decision he verbalizes understanding.

## 2016-05-07 NOTE — Telephone Encounter (Signed)
'  s husband left a message stating that he noticed that senokot was on pt's medication list and he did not recall if they ever had that filled.   They would like to try that medication. They are asking for us to refill the medication.

## 2016-05-07 NOTE — ED Notes (Signed)
Discharge instructions reviewed with patient. Questions fielded by this RN. Patient verbalizes understanding of instructions. Patient discharged home in stable condition per Williams MD . No acute distress noted at time of discharge.   
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
## 2016-05-07 NOTE — ED Notes (Signed)
Pt complaining of left leg posterior  pain "for weeks"

## 2016-05-07 NOTE — ED Triage Notes (Signed)
Pt c/o having constipation, for several weeks, states it is related to pain medication, states they were only able to disimpact a very small amount yesterday and she is having a lot of abd pressure/pain with nausea.

## 2016-05-08 NOTE — Telephone Encounter (Signed)
Er note reviewed, she received an enema with good results. Was instructed to take colace, senokot and Miralax.

## 2016-05-20 ENCOUNTER — Encounter: Payer: Self-pay | Admitting: Physical Medicine & Rehabilitation

## 2016-05-20 ENCOUNTER — Encounter
Payer: BLUE CROSS/BLUE SHIELD | Attending: Physical Medicine & Rehabilitation | Admitting: Physical Medicine & Rehabilitation

## 2016-05-20 ENCOUNTER — Telehealth: Payer: Self-pay | Admitting: Physical Medicine & Rehabilitation

## 2016-05-20 VITALS — BP 114/74 | HR 101 | Resp 14

## 2016-05-20 DIAGNOSIS — M4802 Spinal stenosis, cervical region: Secondary | ICD-10-CM

## 2016-05-20 DIAGNOSIS — R252 Cramp and spasm: Secondary | ICD-10-CM | POA: Insufficient documentation

## 2016-05-20 DIAGNOSIS — G8254 Quadriplegia, C5-C7 incomplete: Secondary | ICD-10-CM | POA: Insufficient documentation

## 2016-05-20 DIAGNOSIS — R52 Pain, unspecified: Secondary | ICD-10-CM | POA: Insufficient documentation

## 2016-05-20 DIAGNOSIS — R011 Cardiac murmur, unspecified: Secondary | ICD-10-CM | POA: Insufficient documentation

## 2016-05-20 DIAGNOSIS — G8250 Quadriplegia, unspecified: Secondary | ICD-10-CM

## 2016-05-20 DIAGNOSIS — R51 Headache: Secondary | ICD-10-CM | POA: Diagnosis not present

## 2016-05-20 DIAGNOSIS — G4700 Insomnia, unspecified: Secondary | ICD-10-CM | POA: Diagnosis not present

## 2016-05-20 DIAGNOSIS — M5416 Radiculopathy, lumbar region: Secondary | ICD-10-CM

## 2016-05-20 DIAGNOSIS — M4722 Other spondylosis with radiculopathy, cervical region: Secondary | ICD-10-CM

## 2016-05-20 DIAGNOSIS — R531 Weakness: Secondary | ICD-10-CM | POA: Diagnosis not present

## 2016-05-20 DIAGNOSIS — K219 Gastro-esophageal reflux disease without esophagitis: Secondary | ICD-10-CM | POA: Diagnosis not present

## 2016-05-20 DIAGNOSIS — Z5181 Encounter for therapeutic drug level monitoring: Secondary | ICD-10-CM

## 2016-05-20 DIAGNOSIS — S14109A Unspecified injury at unspecified level of cervical spinal cord, initial encounter: Secondary | ICD-10-CM | POA: Insufficient documentation

## 2016-05-20 DIAGNOSIS — N3289 Other specified disorders of bladder: Secondary | ICD-10-CM | POA: Insufficient documentation

## 2016-05-20 DIAGNOSIS — R200 Anesthesia of skin: Secondary | ICD-10-CM | POA: Insufficient documentation

## 2016-05-20 DIAGNOSIS — S14105S Unspecified injury at C5 level of cervical spinal cord, sequela: Secondary | ICD-10-CM

## 2016-05-20 MED ORDER — DANTROLENE SODIUM 50 MG PO CAPS: 50.0000 mg | ORAL_CAPSULE | Freq: Three times a day (TID) | ORAL | 3 refills | Status: DC

## 2016-05-20 MED ORDER — OXYCODONE-ACETAMINOPHEN 10-325 MG PO TABS: 1.0000 | ORAL_TABLET | Freq: Four times a day (QID) | ORAL | 0 refills | Status: DC | PRN

## 2016-05-20 MED ORDER — DICLOFENAC SODIUM 75 MG PO TBEC: 75.0000 mg | DELAYED_RELEASE_TABLET | Freq: Two times a day (BID) | ORAL | 3 refills | Status: DC

## 2016-05-20 NOTE — Patient Instructions (Signed)
DANTRIUM: 50MG  TWICE DAILY FOR ONE WEEK THEN INCREASE TO THREE X DAILY   FIGURE 8 FINGER SPLINTS   PLEASE CALL ME WITH ANY PROBLEMS OR QUESTIONS [phone])   HAPPY HOLIDAYS!!!!                    *                * *             *   *   *         *  *   *  *  *     *  *  *  *  *  *  * *  *  *  *  *  *  *  *  *  * *               *  *               *  *               *  *

## 2016-05-20 NOTE — Progress Notes (Signed)
Botox Injection for spasticity using needle EMG guidance Indication: Spastic tetraplegia (HCC) - Plan: oxyCODONE-acetaminophen (PERCOCET) 10-325 MG tablet, diclofenac (VOLTAREN) 75 MG EC tablet, dantrolene (DANTRIUM) 50 MG capsule  Spinal cord injury at C5-C7 level without injury of spinal bone, sequela (HCC) - Plan: oxyCODONE-acetaminophen (PERCOCET) 10-325 MG tablet, diclofenac (VOLTAREN) 75 MG EC tablet, dantrolene (DANTRIUM) 50 MG capsule  Encounter for therapeutic drug monitoring - Plan: oxyCODONE-acetaminophen (PERCOCET) 10-325 MG tablet, diclofenac (VOLTAREN) 75 MG EC tablet, dantrolene (DANTRIUM) 50 MG capsule  Spinal stenosis in cervical region - Plan: oxyCODONE-acetaminophen (PERCOCET) 10-325 MG tablet, diclofenac (VOLTAREN) 75 MG EC tablet, dantrolene (DANTRIUM) 50 MG capsule  Cervical radiculopathy due to degenerative joint disease of spine - Plan: oxyCODONE-acetaminophen (PERCOCET) 10-325 MG tablet, diclofenac (VOLTAREN) 75 MG EC tablet, dantrolene (DANTRIUM) 50 MG capsule  Chronic lumbar radiculopathy - Plan: oxyCODONE-acetaminophen (PERCOCET) 10-325 MG tablet, diclofenac (VOLTAREN) 75 MG EC tablet, dantrolene (DANTRIUM) 50 MG capsule   Dilution: 100 Units/ml        Total Units Injected: 400 Indication: Severe spasticity which interferes with ADL,mobility and/or  hygiene and is unresponsive to medication management and other conservative care.  Informed consent was obtained after describing risks and benefits of the procedure with the patient. This includes bleeding, bruising, infection, excessive weakness, or medication side effects. A REMS form is on file and signed.  Needle: 50mm injectable monopolar needle electrode  Number of units per muscle  LUE: Pectoralis Major 0 units Pectoralis Minor 0 units Biceps 0 units Brachioradialis 0 units FCR 0 units FCU 0 units FDS 100 units FDP 100 units FPL 100 units Pronator Teres 0 units Pronator Quadratus 0 units  RUE:  Extensor digitorum 100 units  All injections were done after obtaining appropriate EMG activity and after negative drawback for blood. The patient tolerated the procedure well. Post procedure instructions were given. Return in about 1 month (around 06/20/2016), or DR. KIRSTEINS REPEAT L4-5 LEFT PARAMEDIAN  TRANSLAMINAR ESI  .  Will titrate dantrium to 50mg  TID Figure 8 splints right hand Refilled percocet 120mg  #120

## 2016-05-21 NOTE — Telephone Encounter (Signed)
VOID - MADE IN ERROR

## 2016-05-26 ENCOUNTER — Telehealth: Payer: Self-pay | Admitting: Physical Medicine & Rehabilitation

## 2016-05-26 NOTE — Telephone Encounter (Addendum)
She cannot even stand at sink any longer. (see previous message) Please advise

## 2016-05-26 NOTE — Telephone Encounter (Signed)
Not sure what this means, is the patient getting formal therapy or if she doing community exercise in a pool If she is getting formal therapy. I would like to see some therapy notes or have some faxed over.

## 2016-05-26 NOTE — Telephone Encounter (Signed)
In the last 4 days, patient has lost everything that she has done at pool therapy, she is unable to do any of the exercises in the water.  Would like a call back with suggestions on what they can do.

## 2016-05-27 NOTE — Telephone Encounter (Signed)
This is a Nutritional therapistcommunity pool, no therapist.  She is losing strength in her legs.  ("says they are getting number and weaker"). They have an appt to see Dr Gae DryNukemar tomorrow.

## 2016-05-27 NOTE — Telephone Encounter (Signed)
Agree with neurosurgery evaluation. If no new spinal compression noted, may need to resume formal PT

## 2016-05-28 ENCOUNTER — Other Ambulatory Visit: Payer: Self-pay | Admitting: Physical Medicine & Rehabilitation

## 2016-06-06 ENCOUNTER — Other Ambulatory Visit: Payer: Self-pay | Admitting: Physical Medicine & Rehabilitation

## 2016-06-09 MED ORDER — PREGABALIN 150 MG PO CAPS: ORAL_CAPSULE | ORAL | 3 refills | Status: DC

## 2016-06-16 ENCOUNTER — Other Ambulatory Visit: Payer: Self-pay | Admitting: Neurosurgery

## 2016-06-16 DIAGNOSIS — G8220 Paraplegia, unspecified: Secondary | ICD-10-CM

## 2016-06-18 ENCOUNTER — Telehealth: Payer: Self-pay | Admitting: *Deleted

## 2016-06-18 NOTE — Telephone Encounter (Signed)
Return Ms. Maple call, she states she is following her bowel program. She's moving her bowels daily and states "it's soft and mushy". Also states she's having abdominal distention, her last enema was two days ago. Instructed to go to PCP or ED to be evaluated. Mr. and Mrs. Maddalena  states they refused to go to ED due to financial hardship, instructed to call her PCP. They verbalized understanding.

## 2016-06-18 NOTE — Telephone Encounter (Signed)
Jasmine JunesBrandon called asking for a call back from Jasmine HallEunice.  I spoke with him and it is concerning her constipation which she is going and getting stool out daily but she is having stomach pain  (yest) and abdomen is distended.  They are asking to speak with Jasmine LamEunice.

## 2016-06-23 ENCOUNTER — Encounter: Payer: Self-pay | Admitting: Emergency Medicine

## 2016-06-23 ENCOUNTER — Emergency Department: Payer: BLUE CROSS/BLUE SHIELD

## 2016-06-23 ENCOUNTER — Emergency Department
Admission: EM | Admit: 2016-06-23 | Discharge: 2016-06-23 | Disposition: A | Discharge status: Home / Self Care | Payer: BLUE CROSS/BLUE SHIELD | Attending: Emergency Medicine | Admitting: Emergency Medicine

## 2016-06-23 DIAGNOSIS — K5900 Constipation, unspecified: Secondary | ICD-10-CM | POA: Diagnosis not present

## 2016-06-23 DIAGNOSIS — R109 Unspecified abdominal pain: Secondary | ICD-10-CM | POA: Diagnosis present

## 2016-06-23 LAB — COMPREHENSIVE METABOLIC PANEL
ALT: 56 U/L — ABNORMAL HIGH (ref 14–54)
ANION GAP: 8 (ref 5–15)
AST: 44 U/L — ABNORMAL HIGH (ref 15–41)
Albumin: 4.1 g/dL (ref 3.5–5.0)
Alkaline Phosphatase: 68 U/L (ref 38–126)
BILIRUBIN TOTAL: 0.4 mg/dL (ref 0.3–1.2)
BUN: 13 mg/dL (ref 6–20)
CHLORIDE: 104 mmol/L (ref 101–111)
CO2: 26 mmol/L (ref 22–32)
Calcium: 9.4 mg/dL (ref 8.9–10.3)
Creatinine, Ser: 0.46 mg/dL (ref 0.44–1.00)
Glucose, Bld: 116 mg/dL — ABNORMAL HIGH (ref 65–99)
POTASSIUM: 4 mmol/L (ref 3.5–5.1)
Sodium: 138 mmol/L (ref 135–145)
TOTAL PROTEIN: 7.3 g/dL (ref 6.5–8.1)

## 2016-06-23 LAB — CBC
HEMATOCRIT: 37.9 % (ref 35.0–47.0)
HEMOGLOBIN: 13.1 g/dL (ref 12.0–16.0)
MCH: 28.7 pg (ref 26.0–34.0)
MCHC: 34.5 g/dL (ref 32.0–36.0)
MCV: 83.3 fL (ref 80.0–100.0)
Platelets: 213 10*3/uL (ref 150–440)
RBC: 4.55 MIL/uL (ref 3.80–5.20)
RDW: 13.4 % (ref 11.5–14.5)
WBC: 7.3 10*3/uL (ref 3.6–11.0)

## 2016-06-23 LAB — LIPASE, BLOOD: LIPASE: 21 U/L (ref 11–51)

## 2016-06-23 MED ORDER — PEG 3350-KCL-NABCB-NACL-NASULF 236 G PO SOLR: 4000.0000 mL | Freq: Once | ORAL | 0 refills | Status: AC

## 2016-06-23 MED ORDER — METHYLNALTREXONE BROMIDE 12 MG/0.6ML ~~LOC~~ SOLN
12.0000 mg | Freq: Once | SUBCUTANEOUS | Status: AC
  Administered 2016-06-23: 12 mg via SUBCUTANEOUS
  Filled 2016-06-23: qty 0.6

## 2016-06-23 NOTE — ED Triage Notes (Signed)
Pt to ED via POV for constipation, states she has some "pasty" BM but no full BM. Pt states she was seen here xfew months ago for same symptoms and given laxatives. Pt c/o intermit abd pain and urge to have BM throughout day. Pt is wheelchair bound from previous spinal cord injury. Pt A&Ox4, VS stable

## 2016-06-23 NOTE — ED Notes (Signed)
Pharmacy notified to send Relistor injection
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
## 2016-06-23 NOTE — Discharge Instructions (Signed)
Please seek medical attention for any high fevers, chest pain, shortness of breath, change in behavior, persistent vomiting, bloody stool or any other new or concerning symptoms.  

## 2016-06-24 ENCOUNTER — Ambulatory Visit
Admission: RE | Admit: 2016-06-24 | Discharge: 2016-06-24 | Disposition: A | Discharge status: Home / Self Care | Payer: BLUE CROSS/BLUE SHIELD | Source: Ambulatory Visit | Attending: Neurosurgery | Admitting: Neurosurgery

## 2016-06-24 DIAGNOSIS — M48061 Spinal stenosis, lumbar region without neurogenic claudication: Secondary | ICD-10-CM | POA: Insufficient documentation

## 2016-06-24 DIAGNOSIS — K8020 Calculus of gallbladder without cholecystitis without obstruction: Secondary | ICD-10-CM | POA: Insufficient documentation

## 2016-06-24 DIAGNOSIS — M5136 Other intervertebral disc degeneration, lumbar region: Secondary | ICD-10-CM | POA: Diagnosis not present

## 2016-06-24 DIAGNOSIS — G8220 Paraplegia, unspecified: Secondary | ICD-10-CM | POA: Diagnosis present

## 2016-06-29 ENCOUNTER — Encounter: Payer: Self-pay | Admitting: Physical Medicine & Rehabilitation

## 2016-06-29 ENCOUNTER — Encounter: Payer: BLUE CROSS/BLUE SHIELD | Attending: Physical Medicine & Rehabilitation

## 2016-06-29 ENCOUNTER — Ambulatory Visit (HOSPITAL_BASED_OUTPATIENT_CLINIC_OR_DEPARTMENT_OTHER): Payer: BLUE CROSS/BLUE SHIELD | Admitting: Physical Medicine & Rehabilitation

## 2016-06-29 ENCOUNTER — Telehealth: Payer: Self-pay | Admitting: Family Medicine

## 2016-06-29 DIAGNOSIS — R52 Pain, unspecified: Secondary | ICD-10-CM | POA: Diagnosis present

## 2016-06-29 DIAGNOSIS — K219 Gastro-esophageal reflux disease without esophagitis: Secondary | ICD-10-CM | POA: Insufficient documentation

## 2016-06-29 DIAGNOSIS — M4722 Other spondylosis with radiculopathy, cervical region: Secondary | ICD-10-CM

## 2016-06-29 DIAGNOSIS — R011 Cardiac murmur, unspecified: Secondary | ICD-10-CM | POA: Diagnosis not present

## 2016-06-29 DIAGNOSIS — S14109A Unspecified injury at unspecified level of cervical spinal cord, initial encounter: Secondary | ICD-10-CM | POA: Diagnosis not present

## 2016-06-29 DIAGNOSIS — N3289 Other specified disorders of bladder: Secondary | ICD-10-CM | POA: Insufficient documentation

## 2016-06-29 DIAGNOSIS — G8254 Quadriplegia, C5-C7 incomplete: Secondary | ICD-10-CM | POA: Diagnosis not present

## 2016-06-29 DIAGNOSIS — R252 Cramp and spasm: Secondary | ICD-10-CM | POA: Insufficient documentation

## 2016-06-29 DIAGNOSIS — G8250 Quadriplegia, unspecified: Secondary | ICD-10-CM

## 2016-06-29 DIAGNOSIS — R51 Headache: Secondary | ICD-10-CM | POA: Diagnosis not present

## 2016-06-29 DIAGNOSIS — M4802 Spinal stenosis, cervical region: Secondary | ICD-10-CM

## 2016-06-29 DIAGNOSIS — S14105S Unspecified injury at C5 level of cervical spinal cord, sequela: Secondary | ICD-10-CM

## 2016-06-29 DIAGNOSIS — R531 Weakness: Secondary | ICD-10-CM | POA: Insufficient documentation

## 2016-06-29 DIAGNOSIS — G4700 Insomnia, unspecified: Secondary | ICD-10-CM | POA: Diagnosis not present

## 2016-06-29 DIAGNOSIS — Z5181 Encounter for therapeutic drug level monitoring: Secondary | ICD-10-CM | POA: Diagnosis not present

## 2016-06-29 DIAGNOSIS — R200 Anesthesia of skin: Secondary | ICD-10-CM | POA: Diagnosis not present

## 2016-06-29 DIAGNOSIS — M5416 Radiculopathy, lumbar region: Secondary | ICD-10-CM

## 2016-06-29 MED ORDER — OXYCODONE-ACETAMINOPHEN 10-325 MG PO TABS: 1.0000 | ORAL_TABLET | Freq: Four times a day (QID) | ORAL | 0 refills | Status: DC | PRN

## 2016-06-29 NOTE — Progress Notes (Signed)
Lumbar transforaminal epidural steroid injection under fluoroscopic guidance  Indication: Lumbosacral radiculitis is not relieved by medication management or other conservative care and interfering with self-care and mobility.   Informed consent was obtained after describing risk and benefits of the procedure with the patient, this includes bleeding, bruising, infection, paralysis and medication side effects.  The patient wishes to proceed and has given written consent.  Patient was placed in prone position.  The lumbar area was marked and prepped with Betadine.  It was entered with a 25-gauge 1-1/2 inch needle and one mL of 1% lidocaine was injected into the skin and subcutaneous tissue.  Then a 22-gauge 3.5  spinal needle was inserted into the Left L4-5 intervertebral foramen under AP, lateral, and oblique view.  Then a solution containing one mL of 10 mg per mL dexamethasone and 2 mL of 1% lidocaine was injected.  The patient tolerated procedure well.  Post procedure instructions were given.  Please see post procedure form. 

## 2016-06-29 NOTE — Patient Instructions (Signed)

## 2016-06-29 NOTE — Telephone Encounter (Signed)
Pt and spouse came into office and wanted pt to transfer back to [facility].  Stated that new PCP they currently have did not want to prescribe Tier 1 opioid medications.  Explained that currently if a patient is dismissed from [facility], they are not eligible for transfer back to our office.  Gave additional phone number for [facility], patient stated does not like to ride the additional miles.  (approximately 7 miles) Gave patient the number to the office of patient experience.

## 2016-06-29 NOTE — Progress Notes (Signed)
  PROCEDURE RECORD [facility]   Name: Jasmine EddyBrenda Senters DOB:1975/12/18 MRN: [id number]  Date:06/29/2016  Physician: Claudette LawsAndrew Kirsteins, MD    Nurse/CMA: Bright CMA  Allergies:  Allergies  Allergen Reactions  . Neurontin [Gabapentin] Other (See Comments)    Visual changes and heart racing.    Consent Signed: Yes.    Is patient diabetic? No.  CBG today?   Pregnant: No. LMP: Patient's last menstrual period was 06/08/2016. (age [age])  Anticoagulants: no Anti-inflammatory: yes (this am) Antibiotics: no  Procedure:L4-5 transformanial  Position: Prone Start Time:234pm  End Time:240pm  Fluoro Time:25s RN/CMA Bright CMA Bright CMA    Time 205pm 245    BP 135/87 126/78    Pulse 90 90    Respirations 16 16    O2 Sat 94 94    S/S 6 6    Pain Level 5 3     D/C home with husband Apolinar JunesBrandon, patient A & O X 3, D/C instructions reviewed, and sits independently.

## 2016-07-01 ENCOUNTER — Other Ambulatory Visit: Payer: Self-pay | Admitting: Physical Medicine & Rehabilitation

## 2016-07-01 NOTE — Telephone Encounter (Signed)
Not sure if this should be filled by us or by PCP, please advise  Neurogenic bowel and bladder.  -bowel program.  -husband can perform caths now             -may attempt voids on commode if she wants. There is no harm in it

## 2016-07-04 ENCOUNTER — Other Ambulatory Visit: Payer: Self-pay | Admitting: Physical Medicine & Rehabilitation

## 2016-07-09 ENCOUNTER — Other Ambulatory Visit: Payer: Self-pay

## 2016-07-09 MED ORDER — SENNA 8.6 MG PO TABS: 2.0000 | ORAL_TABLET | Freq: Every day | ORAL | 1 refills | Status: DC

## 2016-07-10 ENCOUNTER — Other Ambulatory Visit: Payer: Self-pay | Admitting: Physical Medicine & Rehabilitation

## 2016-07-13 ENCOUNTER — Other Ambulatory Visit: Payer: Self-pay | Admitting: *Deleted

## 2016-07-22 ENCOUNTER — Other Ambulatory Visit
Admission: RE | Admit: 2016-07-22 | Discharge: 2016-07-22 | Disposition: A | Discharge status: Home / Self Care | Payer: BLUE CROSS/BLUE SHIELD | Source: Ambulatory Visit | Attending: Gastroenterology | Admitting: Gastroenterology

## 2016-07-22 ENCOUNTER — Ambulatory Visit (INDEPENDENT_AMBULATORY_CARE_PROVIDER_SITE_OTHER): Payer: BLUE CROSS/BLUE SHIELD | Admitting: Gastroenterology

## 2016-07-22 ENCOUNTER — Encounter: Payer: Self-pay | Admitting: Gastroenterology

## 2016-07-22 VITALS — BP 122/82 | HR 86 | Ht 61.0 in

## 2016-07-22 DIAGNOSIS — K5900 Constipation, unspecified: Secondary | ICD-10-CM | POA: Insufficient documentation

## 2016-07-22 LAB — TSH: TSH: 5.527 u[IU]/mL — AB (ref 0.350–4.500)

## 2016-07-22 MED ORDER — LINACLOTIDE 145 MCG PO CAPS: 290.0000 ug | ORAL_CAPSULE | Freq: Every day | ORAL | Status: DC
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
## 2016-07-22 NOTE — Patient Instructions (Addendum)
Constipation, Adult °Constipation is when a person: °· Poops (has a bowel movement) fewer times in a week than normal. °· Has a hard time pooping. °· Has poop that is dry, hard, or bigger than normal. ° °Follow these instructions at home: °Eating and drinking ° °· Eat foods that have a lot of fiber, such as: °? Fresh fruits and vegetables. °? Whole grains. °? Beans. °· Eat less of foods that are high in fat, low in fiber, or overly processed, such as: °? French fries. °? Hamburgers. °? Cookies. °? Candy. °? Soda. °· Drink enough fluid to keep your pee (urine) clear or pale yellow. °General instructions °· Exercise regularly or as told by your doctor. °· Go to the restroom when you feel like you need to poop. Do not hold it in. °· Take over-the-counter and prescription medicines only as told by your doctor. These include any fiber supplements. °· Do pelvic floor retraining exercises, such as: °? Doing deep breathing while relaxing your lower belly (abdomen). °? Relaxing your pelvic floor while pooping. °· Watch your condition for any changes. °· Keep all follow-up visits as told by your doctor. This is important. °Contact a doctor if: °· You have pain that gets worse. °· You have a fever. °· You have not pooped for 4 days. °· You throw up (vomit). °· You are not hungry. °· You lose weight. °· You are bleeding from the anus. °· You have thin, pencil-like poop (stool). °Get help right away if: °· You have a fever, and your symptoms suddenly get worse. °· You leak poop or have blood in your poop. °· Your belly feels hard or bigger than normal (is bloated). °· You have very bad belly pain. °· You feel dizzy or you faint. °This information is not intended to replace advice given to you by your health care provider. Make sure you discuss any questions you have with your health care provider. °Document Released: 11/11/2007 Document Revised: 12/13/2015 Document Reviewed: 11/13/2015 °Elsevier Interactive Patient Education ©  2017 [employer] ° ° °High-Fiber Diet °Fiber, also called dietary fiber, is a type of carbohydrate found in fruits, vegetables, whole grains, and beans. A high-fiber diet can have many health benefits. Your health care provider may recommend a high-fiber diet to help: °· Prevent constipation. Fiber can make your bowel movements more regular. °· Lower your cholesterol. °· Relieve hemorrhoids, uncomplicated diverticulosis, or irritable bowel syndrome. °· Prevent overeating as part of a weight-loss plan. °· Prevent heart disease, type 2 diabetes, and certain cancers. ° °What is my plan? °The recommended daily intake of fiber includes: °· 38 grams for men under age 50. °· 30 grams for men over age 50. °· 25 grams for women under age 50. °· 21 grams for women over age 50. ° °You can get the recommended daily intake of dietary fiber by eating a variety of fruits, vegetables, grains, and beans. Your health care provider may also recommend a fiber supplement if it is not possible to get enough fiber through your diet. °What do I need to know about a high-fiber diet? °· Fiber supplements have not been widely studied for their effectiveness, so it is better to get fiber through food sources. °· Always check the fiber content on the nutrition facts label of any prepackaged food. Look for foods that contain at least 5 grams of fiber per serving. °· Ask your dietitian if you have questions about specific foods that are related to your condition, especially if those   foods are not listed in the following section. °· Increase your daily fiber consumption gradually. Increasing your intake of dietary fiber too quickly may cause bloating, cramping, or gas. °· Drink plenty of water. Water helps you to digest fiber. °What foods can I eat? °Grains °Whole-grain breads. Multigrain cereal. Oats and oatmeal. Brown rice. Barley. Bulgur wheat. Millet. Bran muffins. Popcorn. Rye wafer crackers. °Vegetables °Sweet potatoes. Spinach. Kale.  Artichokes. Cabbage. Broccoli. Green peas. Carrots. Squash. °Fruits °Berries. Pears. Apples. Oranges. Avocados. Prunes and raisins. Dried figs. °Meats and Other Protein Sources °Navy, kidney, pinto, and soy beans. Split peas. Lentils. Nuts and seeds. °Dairy °Fiber-fortified yogurt. °Beverages °Fiber-fortified soy milk. Fiber-fortified orange juice. °Other °Fiber bars. °The items listed above may not be a complete list of recommended foods or beverages. Contact your dietitian for more options. °What foods are not recommended? °Grains °White bread. Pasta made with refined flour. White rice. °Vegetables °Fried potatoes. Canned vegetables. Well-cooked vegetables. °Fruits °Fruit juice. Cooked, strained fruit. °Meats and Other Protein Sources °Fatty cuts of meat. Fried poultry or fried fish. °Dairy °Milk. Yogurt. Cream cheese. Sour cream. °Beverages °Soft drinks. °Other °Cakes and pastries. Butter and oils. °The items listed above may not be a complete list of foods and beverages to avoid. Contact your dietitian for more information. °What are some tips for including high-fiber foods in my diet? °· Eat a wide variety of high-fiber foods. °· Make sure that half of all grains consumed each day are whole grains. °· Replace breads and cereals made from refined flour or white flour with whole-grain breads and cereals. °· Replace white rice with brown rice, bulgur wheat, or millet. °· Start the day with a breakfast that is high in fiber, such as a cereal that contains at least 5 grams of fiber per serving. °· Use beans in place of meat in soups, salads, or pasta. °· Eat high-fiber snacks, such as berries, raw vegetables, nuts, or popcorn. °This information is not intended to replace advice given to you by your health care provider. Make sure you discuss any questions you have with your health care provider. °Document Released: 05/25/2005 Document Revised: 10/31/2015 Document Reviewed: 11/07/2013 °Elsevier Interactive Patient  Education © 2017 [employer] ° °

## 2016-07-27 ENCOUNTER — Encounter: Payer: Self-pay | Admitting: Physical Medicine & Rehabilitation

## 2016-07-27 ENCOUNTER — Other Ambulatory Visit: Payer: Self-pay

## 2016-07-27 ENCOUNTER — Encounter
Payer: BLUE CROSS/BLUE SHIELD | Attending: Physical Medicine & Rehabilitation | Admitting: Physical Medicine & Rehabilitation

## 2016-07-27 DIAGNOSIS — R52 Pain, unspecified: Secondary | ICD-10-CM | POA: Insufficient documentation

## 2016-07-27 DIAGNOSIS — R252 Cramp and spasm: Secondary | ICD-10-CM | POA: Diagnosis not present

## 2016-07-27 DIAGNOSIS — Z5181 Encounter for therapeutic drug level monitoring: Secondary | ICD-10-CM

## 2016-07-27 DIAGNOSIS — G8254 Quadriplegia, C5-C7 incomplete: Secondary | ICD-10-CM | POA: Diagnosis not present

## 2016-07-27 DIAGNOSIS — K219 Gastro-esophageal reflux disease without esophagitis: Secondary | ICD-10-CM | POA: Insufficient documentation

## 2016-07-27 DIAGNOSIS — R200 Anesthesia of skin: Secondary | ICD-10-CM | POA: Diagnosis not present

## 2016-07-27 DIAGNOSIS — G4700 Insomnia, unspecified: Secondary | ICD-10-CM | POA: Insufficient documentation

## 2016-07-27 DIAGNOSIS — R531 Weakness: Secondary | ICD-10-CM | POA: Insufficient documentation

## 2016-07-27 DIAGNOSIS — N3289 Other specified disorders of bladder: Secondary | ICD-10-CM | POA: Insufficient documentation

## 2016-07-27 DIAGNOSIS — M4722 Other spondylosis with radiculopathy, cervical region: Secondary | ICD-10-CM

## 2016-07-27 DIAGNOSIS — M5416 Radiculopathy, lumbar region: Secondary | ICD-10-CM

## 2016-07-27 DIAGNOSIS — R011 Cardiac murmur, unspecified: Secondary | ICD-10-CM | POA: Insufficient documentation

## 2016-07-27 DIAGNOSIS — R51 Headache: Secondary | ICD-10-CM | POA: Insufficient documentation

## 2016-07-27 DIAGNOSIS — M4802 Spinal stenosis, cervical region: Secondary | ICD-10-CM | POA: Diagnosis not present

## 2016-07-27 DIAGNOSIS — G8250 Quadriplegia, unspecified: Secondary | ICD-10-CM

## 2016-07-27 DIAGNOSIS — S14109A Unspecified injury at unspecified level of cervical spinal cord, initial encounter: Secondary | ICD-10-CM | POA: Diagnosis not present

## 2016-07-27 DIAGNOSIS — S14105S Unspecified injury at C5 level of cervical spinal cord, sequela: Secondary | ICD-10-CM

## 2016-07-27 MED ORDER — DANTROLENE SODIUM 100 MG PO CAPS: 100.0000 mg | ORAL_CAPSULE | Freq: Three times a day (TID) | ORAL | 4 refills | Status: DC

## 2016-07-27 MED ORDER — LINACLOTIDE 290 MCG PO CAPS: 290.0000 ug | ORAL_CAPSULE | Freq: Every day | ORAL | 1 refills | Status: DC

## 2016-07-27 MED ORDER — OXYCODONE-ACETAMINOPHEN 10-325 MG PO TABS: 1.0000 | ORAL_TABLET | Freq: Four times a day (QID) | ORAL | 0 refills | Status: DC | PRN

## 2016-07-27 NOTE — Patient Instructions (Signed)
VELCRO COMPRESSION GARMENTS FOR LEGS  PLEASE FEEL FREE TO CALL OUR OFFICE WITH ANY PROBLEMS OR QUESTIONS [phone])
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
## 2016-07-28 ENCOUNTER — Telehealth: Payer: Self-pay

## 2016-07-28 ENCOUNTER — Other Ambulatory Visit: Payer: Self-pay

## 2016-07-28 DIAGNOSIS — R7989 Other specified abnormal findings of blood chemistry: Secondary | ICD-10-CM

## 2016-07-28 NOTE — Telephone Encounter (Signed)
-----   Message from Rayann HemanGinger Feldpausch, [place] sent at 07/28/2016 11:22 AM EST -----   ----- Message ----- From: Wyline MoodKiran Anna, MD Sent: 07/28/2016   9:43 AM To: Rayann HemanGinger Feldpausch, CMA  TSH elevated needs free t4 and t3 with results to be followed by pcp

## 2016-07-28 NOTE — Telephone Encounter (Signed)
Patient notified of elevated TSH results. Advised pt of additional lab testing of free t4 and t3 at hospital. Advised follow-up with pcp.

## 2016-07-29 ENCOUNTER — Other Ambulatory Visit: Payer: Self-pay

## 2016-08-02 ENCOUNTER — Other Ambulatory Visit: Payer: Self-pay | Admitting: Physical Medicine & Rehabilitation

## 2016-08-02 DIAGNOSIS — M4722 Other spondylosis with radiculopathy, cervical region: Secondary | ICD-10-CM

## 2016-08-02 DIAGNOSIS — S14105S Unspecified injury at C5 level of cervical spinal cord, sequela: Secondary | ICD-10-CM

## 2016-08-02 DIAGNOSIS — M5416 Radiculopathy, lumbar region: Secondary | ICD-10-CM

## 2016-08-02 DIAGNOSIS — M4802 Spinal stenosis, cervical region: Secondary | ICD-10-CM

## 2016-08-02 DIAGNOSIS — Z5181 Encounter for therapeutic drug level monitoring: Secondary | ICD-10-CM

## 2016-08-02 DIAGNOSIS — G8250 Quadriplegia, unspecified: Secondary | ICD-10-CM

## 2016-08-03 ENCOUNTER — Other Ambulatory Visit: Payer: Self-pay | Admitting: Physical Medicine & Rehabilitation

## 2016-08-03 DIAGNOSIS — S14105S Unspecified injury at C5 level of cervical spinal cord, sequela: Secondary | ICD-10-CM

## 2016-08-03 DIAGNOSIS — G8250 Quadriplegia, unspecified: Secondary | ICD-10-CM

## 2016-08-06 ENCOUNTER — Telehealth: Payer: Self-pay

## 2016-08-06 ENCOUNTER — Other Ambulatory Visit
Admission: RE | Admit: 2016-08-06 | Discharge: 2016-08-06 | Disposition: A | Discharge status: Home / Self Care | Payer: BLUE CROSS/BLUE SHIELD | Source: Ambulatory Visit | Attending: Gastroenterology | Admitting: Gastroenterology

## 2016-08-06 DIAGNOSIS — R946 Abnormal results of thyroid function studies: Secondary | ICD-10-CM | POA: Insufficient documentation

## 2016-08-06 DIAGNOSIS — R7989 Other specified abnormal findings of blood chemistry: Secondary | ICD-10-CM

## 2016-08-06 LAB — T4, FREE: FREE T4: 0.65 ng/dL (ref 0.61–1.12)

## 2016-08-06 NOTE — Telephone Encounter (Signed)
Pt's hsb Brandon called.  He spoke c Kim on the 22nd.  He left a msg in the portal.  Please call or respond to msg in portal.

## 2016-08-06 NOTE — Telephone Encounter (Signed)
Pts husband aware results of Urine culture from 2/22 were negative (no growth). Per JEG pt may need to see PCP for continued pressure as there are was no bacteria in urine specimen. Jasmine JunesBrandon voiced an understanding. KJ CMA

## 2016-08-07 ENCOUNTER — Telehealth: Payer: Self-pay

## 2016-08-07 ENCOUNTER — Telehealth: Payer: Self-pay | Admitting: Family Medicine

## 2016-08-07 DIAGNOSIS — R7989 Other specified abnormal findings of blood chemistry: Secondary | ICD-10-CM

## 2016-08-07 LAB — T3, FREE: T3, Free: 3.3 pg/mL (ref 2.0–4.4)

## 2016-08-07 NOTE — Telephone Encounter (Signed)
I see patient's abnormal TSH; less than 8; other thyroid parameters normal Please call patient Let her know I saw her thyroid tests I'd like to recheck her thyroid numbers in 2 months Call sooner if any problems with fatigue or weight gain

## 2016-08-07 NOTE — Assessment & Plan Note (Signed)
Abnormal tsh, normal t3 and t4; recheck in 2 months

## 2016-08-07 NOTE — Telephone Encounter (Signed)
Left detailed voicemail

## 2016-08-07 NOTE — Telephone Encounter (Signed)
-----   Message from Wyline MoodKiran Anna, MD sent at 08/07/2016  8:01 AM EST ----- Free T4. T3 normal but TSH elevated ?sub clinical hypothyroidism - needs follow up with PCP  C/c Dr Sherie DonLada

## 2016-08-07 NOTE — Telephone Encounter (Signed)
Advised pt of result notes per Dr. Tobi BastosAnna.   Pt to follow-up with Dr. Sherie DonLada

## 2016-08-07 NOTE — Telephone Encounter (Signed)
-----   Message from Kiran Anna, MD sent at 08/07/2016  8:01 AM EST ----- Free T4. T3 normal but TSH elevated ?sub clinical hypothyroidism - needs follow up with PCP  C/c Dr Lada 

## 2016-08-10 ENCOUNTER — Other Ambulatory Visit: Payer: Self-pay | Admitting: Physical Medicine & Rehabilitation

## 2016-08-10 ENCOUNTER — Encounter: Payer: Self-pay | Admitting: *Deleted

## 2016-08-11 ENCOUNTER — Ambulatory Visit: Payer: BLUE CROSS/BLUE SHIELD | Admitting: Anesthesiology

## 2016-08-11 ENCOUNTER — Encounter
Admission: RE | Disposition: A | Discharge status: Home / Self Care | Payer: Self-pay | Source: Ambulatory Visit | Attending: Gastroenterology

## 2016-08-11 ENCOUNTER — Ambulatory Visit
Admission: RE | Admit: 2016-08-11 | Discharge: 2016-08-11 | Disposition: A | Discharge status: Home / Self Care | Payer: BLUE CROSS/BLUE SHIELD | Source: Ambulatory Visit | Attending: Gastroenterology | Admitting: Gastroenterology

## 2016-08-11 DIAGNOSIS — Z87828 Personal history of other (healed) physical injury and trauma: Secondary | ICD-10-CM | POA: Diagnosis not present

## 2016-08-11 DIAGNOSIS — K640 First degree hemorrhoids: Secondary | ICD-10-CM | POA: Diagnosis not present

## 2016-08-11 DIAGNOSIS — Z79891 Long term (current) use of opiate analgesic: Secondary | ICD-10-CM | POA: Diagnosis not present

## 2016-08-11 DIAGNOSIS — Z993 Dependence on wheelchair: Secondary | ICD-10-CM | POA: Insufficient documentation

## 2016-08-11 DIAGNOSIS — Z79899 Other long term (current) drug therapy: Secondary | ICD-10-CM | POA: Insufficient documentation

## 2016-08-11 DIAGNOSIS — Q438 Other specified congenital malformations of intestine: Secondary | ICD-10-CM | POA: Insufficient documentation

## 2016-08-11 DIAGNOSIS — R194 Change in bowel habit: Secondary | ICD-10-CM | POA: Insufficient documentation

## 2016-08-11 DIAGNOSIS — G4700 Insomnia, unspecified: Secondary | ICD-10-CM | POA: Insufficient documentation

## 2016-08-11 DIAGNOSIS — M170 Bilateral primary osteoarthritis of knee: Secondary | ICD-10-CM | POA: Insufficient documentation

## 2016-08-11 DIAGNOSIS — K219 Gastro-esophageal reflux disease without esophagitis: Secondary | ICD-10-CM | POA: Diagnosis not present

## 2016-08-11 HISTORY — PX: COLONOSCOPY WITH PROPOFOL: SHX5780

## 2016-08-11 SURGERY — COLONOSCOPY WITH PROPOFOL
Anesthesia: General

## 2016-08-11 MED ORDER — PROPOFOL 500 MG/50ML IV EMUL
INTRAVENOUS | Status: AC
  Filled 2016-08-11: qty 50

## 2016-08-11 MED ORDER — PROPOFOL 500 MG/50ML IV EMUL
INTRAVENOUS | Status: DC | PRN
  Administered 2016-08-11: 175 ug/kg/min via INTRAVENOUS

## 2016-08-11 MED ORDER — SODIUM CHLORIDE 0.9 % IV SOLN
INTRAVENOUS | Status: DC
  Administered 2016-08-11: 11:00:00 via INTRAVENOUS

## 2016-08-11 NOTE — Op Note (Signed)
[facility] Gastroenterology Patient Name: Jasmine EddyBrenda [name] Procedure Date: 08/11/2016 11:06 AM MRN: [id number] Account #: [id number] Date of Birth: [birth year] Admit Type: Outpatient Age: 41 [facility] 3 Gender: Female Note Status: Finalized Procedure:            Colonoscopy Indications:          Change in bowel habits Providers:            Wyline MoodKiran Wilgus Deyton MD, MD Referring MD:         Kerman PasseyMelinda P. Lada (Referring MD) Medicines:            Monitored Anesthesia Care Complications:        No immediate complications. Procedure:            Pre-Anesthesia Assessment:                       - Prior to the procedure, a History and Physical was                        performed, and patient medications, allergies and                        sensitivities were reviewed. The patient's tolerance of                        previous anesthesia was reviewed.                       - The risks and benefits of the procedure and the                        sedation options and risks were discussed with the                        patient. All questions were answered and informed                        consent was obtained.                       - ASA Grade Assessment: III - A patient with severe                        systemic disease.                       After obtaining informed consent, the colonoscope was                        passed under direct vision. Throughout the procedure,                        the patient's blood pressure, pulse, and oxygen                        saturations were monitored continuously. The Olympus                        CF-H180AL colonoscope ( S#: [id number] ) was introduced  through the anus and advanced to the the cecum,                        identified by the appendiceal orifice, IC valve and                        transillumination. The colonoscopy was somewhat                        difficult due to a tortuous colon. The patient                         tolerated the procedure well. The quality of the bowel                        preparation was excellent. Findings:      The perianal and digital rectal examinations were normal.      Non-bleeding internal hemorrhoids were found during retroflexion. The       hemorrhoids were small and Grade I (internal hemorrhoids that do not       prolapse).      The colon (entire examined portion) was significantly tortuous.      The exam was otherwise without abnormality on direct and retroflexion       views. Impression:           - Non-bleeding internal hemorrhoids.                       - Tortuous colon.                       - The examination was otherwise normal on direct and                        retroflexion views.                       - No specimens collected. Recommendation:       - Discharge patient to home (with escort).                       - Resume previous diet.                       - Continue present medications.                       - Repeat colonoscopy in 10 years for screening purposes.                       - Return to GI office in 6 weeks. Procedure Code(s):    --- Professional ---                       [phone], Colonoscopy, flexible; diagnostic, including                        collection of specimen(s) by brushing or washing, when                        performed (separate procedure) Diagnosis Code(s):    --- Professional ---  R19.4, Change in bowel habit                       Q43.8, Other specified congenital malformations of                        intestine                       K64.0, First degree hemorrhoids CPT copyright 2016 American Medical Association. All rights reserved. The codes documented in this report are preliminary and upon coder review may  be revised to meet current compliance requirements. Wyline Mood, MD Wyline Mood MD, MD 08/11/2016 11:58:02 AM This report has been signed electronically. Number of Addenda:  0 Note Initiated On: 08/11/2016 11:06 AM Scope Withdrawal Time: 0 hours 12 minutes 49 seconds  Total Procedure Duration: 0 hours 19 minutes 16 seconds       [facility]
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
## 2016-08-11 NOTE — Transfer of Care (Signed)
Immediate Anesthesia Transfer of Care Note  Patient: [name]  Procedure(s) Performed: Procedure(s): COLONOSCOPY WITH PROPOFOL (N/A)  Patient Location: PACU  Anesthesia Type:General  Level of Consciousness: sedated  Airway & Oxygen Therapy: Patient Spontanous Breathing and Patient connected to nasal cannula oxygen  Post-op Assessment: Report given to RN and Post -op Vital signs reviewed and stable  Post vital signs: Reviewed and stable  Last Vitals: There were no vitals filed for this visit.  Last Pain: There were no vitals filed for this visit.       Complications: No apparent anesthesia complications

## 2016-08-11 NOTE — Anesthesia Post-op Follow-up Note (Cosign Needed)
Anesthesia QCDR form completed.        

## 2016-08-12 ENCOUNTER — Encounter: Payer: Self-pay | Admitting: Gastroenterology

## 2016-08-12 NOTE — Anesthesia Postprocedure Evaluation (Signed)
Anesthesia Post Note  Patient: Jasmine [name]  Procedure(s) Performed: Procedure(s) (LRB): COLONOSCOPY WITH PROPOFOL (N/A)  Patient location during evaluation: Endoscopy Anesthesia Type: General Level of consciousness: awake and alert and oriented Pain management: pain level controlled Vital Signs Assessment: post-procedure vital signs reviewed and stable Respiratory status: spontaneous breathing, nonlabored ventilation and respiratory function stable Cardiovascular status: blood pressure returned to baseline and stable Postop Assessment: no signs of nausea or vomiting Anesthetic complications: no     Last Vitals:  Vitals:   08/11/16 1221 08/11/16 1231  BP: (!) 144/81 (!) 148/79  Pulse: 69 66  Resp: 14 10  Temp:      Last Pain:  Vitals:   08/11/16 1200  TempSrc: Tympanic                 [name]

## 2016-08-25 ENCOUNTER — Other Ambulatory Visit: Payer: Self-pay | Admitting: Physical Medicine & Rehabilitation

## 2016-08-25 ENCOUNTER — Telehealth: Payer: Self-pay

## 2016-08-25 ENCOUNTER — Encounter: Payer: Self-pay | Admitting: Gastroenterology

## 2016-08-25 ENCOUNTER — Encounter
Payer: BLUE CROSS/BLUE SHIELD | Attending: Physical Medicine & Rehabilitation | Admitting: Physical Medicine & Rehabilitation

## 2016-08-25 ENCOUNTER — Ambulatory Visit
Admission: RE | Admit: 2016-08-25 | Discharge: 2016-08-25 | Disposition: A | Discharge status: Home / Self Care | Payer: BLUE CROSS/BLUE SHIELD | Source: Ambulatory Visit | Attending: Gastroenterology | Admitting: Gastroenterology

## 2016-08-25 ENCOUNTER — Other Ambulatory Visit: Payer: Self-pay

## 2016-08-25 ENCOUNTER — Encounter: Payer: Self-pay | Admitting: Physical Medicine & Rehabilitation

## 2016-08-25 DIAGNOSIS — R011 Cardiac murmur, unspecified: Secondary | ICD-10-CM | POA: Insufficient documentation

## 2016-08-25 DIAGNOSIS — R1013 Epigastric pain: Secondary | ICD-10-CM

## 2016-08-25 DIAGNOSIS — R531 Weakness: Secondary | ICD-10-CM | POA: Insufficient documentation

## 2016-08-25 DIAGNOSIS — M5416 Radiculopathy, lumbar region: Secondary | ICD-10-CM | POA: Diagnosis not present

## 2016-08-25 DIAGNOSIS — R200 Anesthesia of skin: Secondary | ICD-10-CM | POA: Diagnosis not present

## 2016-08-25 DIAGNOSIS — R252 Cramp and spasm: Secondary | ICD-10-CM | POA: Diagnosis not present

## 2016-08-25 DIAGNOSIS — R51 Headache: Secondary | ICD-10-CM | POA: Diagnosis not present

## 2016-08-25 DIAGNOSIS — M4722 Other spondylosis with radiculopathy, cervical region: Secondary | ICD-10-CM

## 2016-08-25 DIAGNOSIS — G8250 Quadriplegia, unspecified: Secondary | ICD-10-CM | POA: Diagnosis not present

## 2016-08-25 DIAGNOSIS — S14105S Unspecified injury at C5 level of cervical spinal cord, sequela: Secondary | ICD-10-CM

## 2016-08-25 DIAGNOSIS — K219 Gastro-esophageal reflux disease without esophagitis: Secondary | ICD-10-CM | POA: Insufficient documentation

## 2016-08-25 DIAGNOSIS — Z5181 Encounter for therapeutic drug level monitoring: Secondary | ICD-10-CM

## 2016-08-25 DIAGNOSIS — G8254 Quadriplegia, C5-C7 incomplete: Secondary | ICD-10-CM | POA: Insufficient documentation

## 2016-08-25 DIAGNOSIS — N3289 Other specified disorders of bladder: Secondary | ICD-10-CM | POA: Insufficient documentation

## 2016-08-25 DIAGNOSIS — G4700 Insomnia, unspecified: Secondary | ICD-10-CM | POA: Diagnosis not present

## 2016-08-25 DIAGNOSIS — M4802 Spinal stenosis, cervical region: Secondary | ICD-10-CM

## 2016-08-25 DIAGNOSIS — S14109A Unspecified injury at unspecified level of cervical spinal cord, initial encounter: Secondary | ICD-10-CM | POA: Insufficient documentation

## 2016-08-25 DIAGNOSIS — R52 Pain, unspecified: Secondary | ICD-10-CM | POA: Diagnosis present

## 2016-08-25 MED ORDER — OXYCODONE-ACETAMINOPHEN 10-325 MG PO TABS: 1.0000 | ORAL_TABLET | Freq: Four times a day (QID) | ORAL | 0 refills | Status: DC | PRN

## 2016-08-25 NOTE — Progress Notes (Signed)
Botox Injection for spasticity using needle EMG guidance Indication:  Spastic tetraplegia Injection all 4 limbs  Dilution: 100 Units/ml        Total Units Injected: 500 units Indication: Severe spasticity which interferes with ADL,mobility and/or  hygiene and is unresponsive to medication management and other conservative care Informed consent was obtained after describing risks and benefits of the procedure with the patient. This includes bleeding, bruising, infection, excessive weakness, or medication side effects. A REMS form is on file and signed.  Needle: 50mm injectable monopolar needle electrode  Number of units per muscle Pectoralis Major 0 units Pectoralis Minor 0 units Biceps 0 units Brachioradialis 0 units FCR 0 units FCU 0 units FDS 75 units Left and 50 units Right FDP 75  Units Left and 50 units right Left lumbricals 50 units  FPL 0 units Pronator Teres 0 units Pronator Quadratus 0 units Quadriceps 100 units right and 100 units left with 4 access points Gastroc/soleus - units Hamstrings 0 units Tibialis Posterior 0 units EHL 0 units All injections were done after obtaining appropriate EMG activity and after negative drawback for blood. The patient tolerated the procedure well. Post procedure instructions were given. Return in about 1 month (around 09/25/2016).   I also [facility] for gait training, strength, spasticity mgt as well as a referral to [facility] for nutritional mgt.

## 2016-08-25 NOTE — Patient Instructions (Signed)
PLEASE FEEL FREE TO CALL OUR OFFICE WITH ANY PROBLEMS OR QUESTIONS ([phone])      

## 2016-08-25 NOTE — Telephone Encounter (Signed)
Pt's husband called concerning unanswered MyChart messages.   Pt has very loose stool, fecal frequency, and constipation.   Contacted Dr. Servando SnareWohl (Dr. Tobi BastosAnna unavailable). Per Dr. Servando SnareWohl scheduled KUB due to patient distress.

## 2016-08-26 ENCOUNTER — Other Ambulatory Visit: Payer: Self-pay | Admitting: Physical Medicine & Rehabilitation

## 2016-08-29 ENCOUNTER — Other Ambulatory Visit: Payer: Self-pay | Admitting: Physical Medicine & Rehabilitation

## 2016-08-29 DIAGNOSIS — M4802 Spinal stenosis, cervical region: Secondary | ICD-10-CM

## 2016-08-29 DIAGNOSIS — G8250 Quadriplegia, unspecified: Secondary | ICD-10-CM

## 2016-08-29 DIAGNOSIS — M5416 Radiculopathy, lumbar region: Secondary | ICD-10-CM

## 2016-08-29 DIAGNOSIS — S14105S Unspecified injury at C5 level of cervical spinal cord, sequela: Secondary | ICD-10-CM

## 2016-08-29 DIAGNOSIS — Z5181 Encounter for therapeutic drug level monitoring: Secondary | ICD-10-CM

## 2016-08-29 DIAGNOSIS — M4722 Other spondylosis with radiculopathy, cervical region: Secondary | ICD-10-CM

## 2016-09-01 ENCOUNTER — Ambulatory Visit (INDEPENDENT_AMBULATORY_CARE_PROVIDER_SITE_OTHER): Payer: BLUE CROSS/BLUE SHIELD | Admitting: Gastroenterology

## 2016-09-01 ENCOUNTER — Encounter: Payer: Self-pay | Admitting: Gastroenterology

## 2016-09-01 ENCOUNTER — Other Ambulatory Visit: Payer: Self-pay

## 2016-09-01 VITALS — BP 140/84 | HR 86 | Temp 98.5°F | Ht 61.0 in | Wt 160.0 lb

## 2016-09-01 DIAGNOSIS — K5909 Other constipation: Secondary | ICD-10-CM

## 2016-09-01 MED ORDER — PLECANATIDE 3 MG PO TABS: 1.0000 | ORAL_TABLET | ORAL | 0 refills | Status: AC

## 2016-09-01 NOTE — Progress Notes (Signed)
Primary Care Physician: Marguarite Arbour, MD  Primary Gastroenterologist:  Dr. Wyline Mood   Chief Complaint  Patient presents with  . Follow-up  . Medication Reaction    Linzess caused back pain and fecal frequency    HPI: Jasmine [name] is a 41 y.o. female she is here today to follow up for constipation.    Summary of history : She was seen recently in the ER on 06/23/16 for abdominal pain secondary to constipation. She has a history of spinal cord injury and spastic tetraplegia.X ray abdomen on 06/23/16, 05/07/16 shows moderate large amount of feces throughout the colon .  She says that it all began after she came off the hydrocodone and went on the oxycodone- approximately 3 months back . Previously was having a bowel movement daily but small qty , was very hard. No blood in stool. So far she tried "saline", magnesium citrate, once a week so far. MOM. She takes senna , miralax daily , tried metamucil daily . Diet low in fiber.  Interval history 07/2016-08/2016   Colonoscopy showed a very tortious colon otherwise normal on 08/11/16 She was commenced on linzess , says caused a lot of gas, had abdominal distension. Presently she is taking senna, having a bowel movement once every 5 days, while on lizess had a small bowel movement daily , very small/.   Current Outpatient Prescriptions  Medication Sig Dispense Refill  . B Complex-C ([facility] B COMPLEX PLUS C) TABS TAKE 1 TABLET BY MOUTH EVERY DAY 30 tablet 1  . baclofen (LIORESAL) 20 MG tablet TAKE 1 TABLET (20 MG TOTAL) BY MOUTH 4 (FOUR) TIMES DAILY. 120 tablet 4  . bisacodyl (DULCOLAX) 10 MG suppository Place 1 suppository (10 mg total) rectally daily at 6 (six) AM. 12 suppository 0  . clonazePAM (KLONOPIN) 1 MG tablet TAKE 1 TABLET (1 MG TOTAL) BY MOUTH 3 (THREE) TIMES DAILY 90 tablet 1  . [facility] SENNA 8.6 MG tablet TAKE 2 TABLETS BY MOUTH EVERY DAY 120 tablet 0  . dantrolene (DANTRIUM) 100 MG capsule Take 1 capsule (100 mg total) by mouth 3  (three) times daily. 90 capsule 4  . diclofenac (VOLTAREN) 75 MG EC tablet TAKE 1 TABLET (75 MG TOTAL) BY MOUTH 2 (TWO) TIMES DAILY. 60 tablet 3  . DULoxetine (CYMBALTA) 30 MG capsule TAKE ONE CAPSULE BY MOUTH EVERY DAY 30 capsule 3  . mupirocin ointment (BACTROBAN) 2 % Apply topically.    Marland Kitchen MYRBETRIQ 50 MG TB24 tablet Take 50 mg by mouth daily.  11  . nitrofurantoin, macrocrystal-monohydrate, (MACROBID) 100 MG capsule TAKE 1 CAPSULE BY MOUTH DAILY WITH FOOD. START AFTER YOU FINISH SULFAMETHOXAZOLE/TRIMETHOPRIM  6  . nortriptyline (PAMELOR) 25 MG capsule Take 1 capsule (25 mg total) by mouth at bedtime. 90 day rx 90 capsule 4  . omega-3 acid ethyl esters (LOVAZA) 1 g capsule Take 1 capsule (1 g total) by mouth daily. 30 capsule 1  . oxyCODONE-acetaminophen (PERCOCET) 10-325 MG tablet Take 1 tablet by mouth every 6 (six) hours as needed for pain. 120 tablet 0  . pantoprazole (PROTONIX) 40 MG tablet TAKE 1 TABLET BY MOUTH EVERY DAY 30 tablet 1  . polyethylene glycol (MIRALAX / GLYCOLAX) packet TAKE 1 PACKET AS DIRECTED DAILY 14 packet 2  . pregabalin (LYRICA) 150 MG capsule TAKE 1 CAPSULE BY MOUTH 2 TIMES DAILY 60 capsule 3  . VESICARE 10 MG tablet Take 10 mg by mouth daily.  11  . dantrolene (DANTRIUM) 50 MG capsule TAKE 1  CAPSULE (50 MG TOTAL) BY MOUTH 3 (THREE) TIMES DAILY.  3  . linaclotide (LINZESS) 290 MCG CAPS capsule Take 1 capsule (290 mcg total) by mouth daily before breakfast. (Patient not taking: Reported on 09/01/2016) 30 capsule 1   Current Facility-Administered Medications  Medication Dose Route Frequency Provider Last Rate Last Dose  . linaclotide (LINZESS) capsule 290 mcg  290 mcg Oral QAC breakfast Wyline MoodKiran Mariha Sleeper, MD        Allergies as of 09/01/2016 - Review Complete 09/01/2016  Allergen Reaction Noted  . Neurontin [gabapentin] Palpitations and Other (See Comments) 10/14/2015    ROS:  General: Negative for anorexia, weight loss, fever, chills, fatigue, weakness. ENT: Negative  for hoarseness, difficulty swallowing , nasal congestion. CV: Negative for chest pain, angina, palpitations, dyspnea on exertion, peripheral edema.  Respiratory: Negative for dyspnea at rest, dyspnea on exertion, cough, sputum, wheezing.  GI: See history of present illness. GU:  Negative for dysuria, hematuria, urinary incontinence, urinary frequency, nocturnal urination.  Endo: Negative for unusual weight change.    Physical Examination:   BP 140/84 (BP Location: Left Arm, Patient Position: Sitting, Cuff Size: Normal)   Pulse 86   Temp 98.5 F (36.9 C) (Oral)   Ht 5\' 1"  (1.549 m)   Wt 160 lb (72.6 kg)   LMP 08/10/2016 (Exact Date) Comment: Patient on period. Anesthesia aware and said no urine preg. necessary (per Donnie Ahoobin Bridges, RN)  BMI 30.23 kg/m   General: Well-nourished, well-developed in no acute distress.  Eyes: No icterus. Conjunctivae pink. Mouth: Oropharyngeal mucosa moist and pink , no lesions erythema or exudate. Lungs: Clear to auscultation bilaterally. Non-labored. Heart: Regular rate and rhythm, no murmurs rubs or gallops.  Abdomen: Bowel sounds are normal, nontender, nondistended, no hepatosplenomegaly or masses, no abdominal bruits or hernia , no rebound or guarding.   Extremities: sitting in wheel chair  Skin: Warm and dry, no jaundice.   Psych: Alert and cooperative, normal mood and affect.   Imaging Studies: Dg Abd 1 View  Result Date: 08/26/2016 CLINICAL DATA:  Diarrhea and abdominal pain EXAM: ABDOMEN - 1 VIEW COMPARISON:  06/23/2016 FINDINGS: Scattered large and small bowel gas is noted. No obstructive changes are seen. No significant constipation is noted. Changes of prior tubal ligation are noted. No acute bony or soft tissue abnormality is seen. Chronic calcification is noted in the right upper quadrant un associated with the right kidney. IMPRESSION: No acute abnormality noted. Electronically Signed   By: Alcide CleverMark  Lukens M.D.   On: 08/26/2016 08:00     Assessment and Plan:   Jasmine [name] is a 41 y.o. y/o female here to follow up for constipation . It is very likely that her constipation is from a combination from spinal cord injury and effect or narcotics.    Plan  1. High fiber diet- she has increased her content in her diet  2. Limit narcotic use as much as possible.  3. Did not tolerate linzess, will try Trulance if it fails will try amitiza.   Dr Wyline MoodKiran Jaylynn Siefert  MD Follow up in 2 weeks.

## 2016-09-03 ENCOUNTER — Other Ambulatory Visit: Payer: Self-pay

## 2016-09-03 DIAGNOSIS — K5900 Constipation, unspecified: Secondary | ICD-10-CM

## 2016-09-03 MED ORDER — LUBIPROSTONE 24 MCG PO CAPS: 24.0000 ug | ORAL_CAPSULE | Freq: Two times a day (BID) | ORAL | 0 refills | Status: AC

## 2016-09-09 ENCOUNTER — Encounter: Payer: BLUE CROSS/BLUE SHIELD | Attending: Physical Medicine & Rehabilitation | Admitting: Dietician

## 2016-09-09 ENCOUNTER — Encounter: Payer: Self-pay | Admitting: Gastroenterology

## 2016-09-09 ENCOUNTER — Encounter: Payer: Self-pay | Admitting: Dietician

## 2016-09-09 VITALS — Ht 61.0 in | Wt 162.5 lb

## 2016-09-09 DIAGNOSIS — E669 Obesity, unspecified: Secondary | ICD-10-CM | POA: Diagnosis not present

## 2016-09-09 DIAGNOSIS — Z6830 Body mass index (BMI) 30.0-30.9, adult: Secondary | ICD-10-CM

## 2016-09-09 NOTE — Patient Instructions (Signed)
   Eat generous portions of vegetables, include a serving of veggies during the day also.   Keep protein portions to 4oz or less with each meal (palm-size).   Ok to include small-moderate portions of starches, especially whole grain or high fiber versions (fiber helps with constipation)  Keep up your exercises and standing in the kitchen to help with calorie burning.   Drink plenty of fluids to help with constipation, OK to drink flavored waters or Crystal-light type drinks.
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
## 2016-09-10 ENCOUNTER — Encounter: Payer: Self-pay | Admitting: Rehabilitation

## 2016-09-10 ENCOUNTER — Ambulatory Visit: Payer: BLUE CROSS/BLUE SHIELD | Attending: Physical Medicine & Rehabilitation | Admitting: Rehabilitation

## 2016-09-10 DIAGNOSIS — R2689 Other abnormalities of gait and mobility: Secondary | ICD-10-CM

## 2016-09-10 DIAGNOSIS — M6281 Muscle weakness (generalized): Secondary | ICD-10-CM | POA: Diagnosis present

## 2016-09-10 DIAGNOSIS — G8254 Quadriplegia, C5-C7 incomplete: Secondary | ICD-10-CM

## 2016-09-10 DIAGNOSIS — R293 Abnormal posture: Secondary | ICD-10-CM | POA: Diagnosis present
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
## 2016-09-14 ENCOUNTER — Telehealth: Payer: Self-pay

## 2016-09-14 ENCOUNTER — Encounter: Payer: Self-pay | Admitting: Rehabilitation

## 2016-09-14 ENCOUNTER — Ambulatory Visit: Payer: BLUE CROSS/BLUE SHIELD | Admitting: Rehabilitation

## 2016-09-14 DIAGNOSIS — G8254 Quadriplegia, C5-C7 incomplete: Secondary | ICD-10-CM | POA: Diagnosis not present

## 2016-09-14 DIAGNOSIS — M6281 Muscle weakness (generalized): Secondary | ICD-10-CM

## 2016-09-14 DIAGNOSIS — R293 Abnormal posture: Secondary | ICD-10-CM

## 2016-09-14 DIAGNOSIS — R2689 Other abnormalities of gait and mobility: Secondary | ICD-10-CM

## 2016-09-14 NOTE — Telephone Encounter (Signed)
-----   Message from Wyline Mood, MD sent at 09/14/2016  9:39 AM EDT ----- Regarding: RE: Constipation 1. What dose of Amitiza is she on  2. If on low dose we can increase to 24 mcg twice a day , if already on high dose then can try trulance , if staring trulance suggest clean out with one dose of magnesium citrate followed by commencing Trulance   ----- Message ----- From: [name], CMA Sent: 09/14/2016   9:35 AM To: Wyline Mood, MD Subject: Constipation                                   Pt constipated x 5 days after starting Amitiza. Still unable to defecate after using suppository.  Feels constant urge to defecate.   Pt feels this is consuming her life and she's unable to concentrate on therapy because of it.

## 2016-09-14 NOTE — Patient Instructions (Signed)
    SUPINE HIP ABDUCTION - ELASTIC BAND CLAMS  Lie down on your back with your knees bent. Place an elastic band around your knees and then draw your knees apart.  Repeat x 10 reps, 2 times per day.   KNEE: Extension, Short Arc Quads - Supine    Place bolster under knees. Raise one leg until knee is straight. _10__ reps per set, _2__ sets per day, _5-7__ days per week   Copyright  [employer]. All rights reserved.

## 2016-09-14 NOTE — Telephone Encounter (Signed)
Pt constipated x 5 days after starting Amitiza. Still unable to defecate after using suppository.  Feels constant urge to defecate.   Pt feels this is consuming her life and she's unable to concentrate on therapy because of it.   Forwarded message to Dr. Tobi Bastos.
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
## 2016-09-17 ENCOUNTER — Encounter: Payer: Self-pay | Admitting: Gastroenterology

## 2016-09-17 ENCOUNTER — Ambulatory Visit (INDEPENDENT_AMBULATORY_CARE_PROVIDER_SITE_OTHER): Payer: BLUE CROSS/BLUE SHIELD | Admitting: Gastroenterology

## 2016-09-17 ENCOUNTER — Other Ambulatory Visit
Admission: RE | Admit: 2016-09-17 | Discharge: 2016-09-17 | Disposition: A | Discharge status: Home / Self Care | Payer: BLUE CROSS/BLUE SHIELD | Source: Ambulatory Visit | Attending: Family Medicine | Admitting: Family Medicine

## 2016-09-17 DIAGNOSIS — K592 Neurogenic bowel, not elsewhere classified: Secondary | ICD-10-CM

## 2016-09-17 DIAGNOSIS — R946 Abnormal results of thyroid function studies: Secondary | ICD-10-CM | POA: Diagnosis not present

## 2016-09-17 DIAGNOSIS — K5901 Slow transit constipation: Secondary | ICD-10-CM | POA: Diagnosis not present

## 2016-09-17 LAB — T4, FREE: Free T4: 0.91 ng/dL (ref 0.61–1.12)

## 2016-09-17 LAB — TSH: TSH: 5.287 u[IU]/mL — AB (ref 0.350–4.500)

## 2016-09-17 NOTE — Progress Notes (Signed)
Primary Care Physician: Leotis Shames, MD  Primary Gastroenterologist:  Dr. Wyline Mood   No chief complaint on file.   HPI: Jasmine [name] is a 41 y.o. female    Summary of history : She was seen recently in the ER on 06/23/16 for abdominal pain secondary to constipation. She has a history of spinal cord injury and spastic tetraplegia.X ray abdomen on 06/23/16, 05/07/16 shows moderate large amount of feces throughout the colon . She says that it all began after she came off the hydrocodone and went on the oxycodone- approximately 3 months back . Previously was having a bowel movement daily but small qty , was very hard. No blood in stool. So far she tried "saline", magnesium citrate, once a week so far. MOM. She takes senna , miralax daily , tried metamucil daily . Diet low in fiber. Colonoscopy showed a very tortious colon otherwise normal on 08/11/16.   Tried linzess, amitiza  which did not work   Interval history 08/2016 -09/2016  Amitiza too at high dose did not work, not having regular bowel movements.      Current Outpatient Prescriptions  Medication Sig Dispense Refill  . B Complex-C ([facility] B COMPLEX PLUS C) TABS TAKE 1 TABLET BY MOUTH EVERY DAY 30 tablet 1  . baclofen (LIORESAL) 20 MG tablet TAKE 1 TABLET (20 MG TOTAL) BY MOUTH 4 (FOUR) TIMES DAILY. 120 tablet 4  . bisacodyl (DULCOLAX) 10 MG suppository Place 1 suppository (10 mg total) rectally daily at 6 (six) AM. 12 suppository 0  . clonazePAM (KLONOPIN) 1 MG tablet TAKE 1 TABLET (1 MG TOTAL) BY MOUTH 3 (THREE) TIMES DAILY 90 tablet 1  . [facility] SENNA 8.6 MG tablet TAKE 2 TABLETS BY MOUTH EVERY DAY 120 tablet 0  . dantrolene (DANTRIUM) 100 MG capsule Take 1 capsule (100 mg total) by mouth 3 (three) times daily. 90 capsule 4  . dantrolene (DANTRIUM) 50 MG capsule TAKE 1 CAPSULE (50 MG TOTAL) BY MOUTH 3 (THREE) TIMES DAILY.  3  . diclofenac (VOLTAREN) 75 MG EC tablet TAKE 1 TABLET (75 MG TOTAL) BY MOUTH 2 (TWO) TIMES DAILY. 60  tablet 3  . DULoxetine (CYMBALTA) 30 MG capsule TAKE ONE CAPSULE BY MOUTH EVERY DAY 30 capsule 3  . lubiprostone (AMITIZA) 24 MCG capsule Take 1 capsule (24 mcg total) by mouth 2 (two) times daily with a meal. 60 capsule 0  . MYRBETRIQ 50 MG TB24 tablet Take 50 mg by mouth daily.  11  . nitrofurantoin, macrocrystal-monohydrate, (MACROBID) 100 MG capsule TAKE 1 CAPSULE BY MOUTH DAILY WITH FOOD. START AFTER YOU FINISH SULFAMETHOXAZOLE/TRIMETHOPRIM  6  . nortriptyline (PAMELOR) 25 MG capsule Take 1 capsule (25 mg total) by mouth at bedtime. 90 day rx 90 capsule 4  . omega-3 acid ethyl esters (LOVAZA) 1 g capsule Take 1 capsule (1 g total) by mouth daily. 30 capsule 1  . oxyCODONE-acetaminophen (PERCOCET) 10-325 MG tablet Take 1 tablet by mouth every 6 (six) hours as needed for pain. 120 tablet 0  . pantoprazole (PROTONIX) 40 MG tablet TAKE 1 TABLET BY MOUTH EVERY DAY 30 tablet 1  . Plecanatide (TRULANCE) 3 MG TABS Take 1 tablet by mouth 1 day or 1 dose. (Patient not taking: Reported on 09/10/2016) 30 tablet 0  . polyethylene glycol (MIRALAX / GLYCOLAX) packet TAKE 1 PACKET AS DIRECTED DAILY 14 packet 2  . pregabalin (LYRICA) 150 MG capsule TAKE 1 CAPSULE BY MOUTH 2 TIMES DAILY 60 capsule 3  . Turmeric 500 MG  TABS Take 2 tablets by mouth daily.    . VESICARE 10 MG tablet Take 10 mg by mouth daily.  11   No current facility-administered medications for this visit.     Allergies as of 09/17/2016 - Review Complete 09/14/2016  Allergen Reaction Noted  . Neurontin [gabapentin] Palpitations and Other (See Comments) 10/14/2015    ROS:  General: Negative for anorexia, weight loss, fever, chills, fatigue, weakness. ENT: Negative for hoarseness, difficulty swallowing , nasal congestion. CV: Negative for chest pain, angina, palpitations, dyspnea on exertion, peripheral edema.  Respiratory: Negative for dyspnea at rest, dyspnea on exertion, cough, sputum, wheezing.  GI: See history of present  illness. GU:  Negative for dysuria, hematuria, urinary incontinence, urinary frequency, nocturnal urination.  Endo: Negative for unusual weight change.    Physical Examination:   There were no vitals taken for this visit.  General: Well-nourished, well-developed in no acute distress. Sitting in a wheel chair  Eyes: No icterus. Conjunctivae pink. Mouth: Oropharyngeal mucosa moist and pink , no lesions erythema or exudate. Lungs: Clear to auscultation bilaterally. Non-labored. Heart: Regular rate and rhythm, no murmurs rubs or gallops.  Abdomen: Bowel sounds are normal, nontender, nondistended, no hepatosplenomegaly or masses, no abdominal bruits or hernia , no rebound or guarding.   Skin: Warm and dry, no jaundice.   Psych: Alert and cooperative, normal mood and affect.   Imaging Studies: Dg Abd 1 View  Result Date: 08/26/2016 CLINICAL DATA:  Diarrhea and abdominal pain EXAM: ABDOMEN - 1 VIEW COMPARISON:  06/23/2016 FINDINGS: Scattered large and small bowel gas is noted. No obstructive changes are seen. No significant constipation is noted. Changes of prior tubal ligation are noted. No acute bony or soft tissue abnormality is seen. Chronic calcification is noted in the right upper quadrant un associated with the right kidney. IMPRESSION: No acute abnormality noted. Electronically Signed   By: Alcide Clever M.D.   On: 08/26/2016 08:00    Assessment and Plan:   Jasmine [name] is a 41 y.o. y/o female here to follow up for constipation . It is very likely that her constipation is from a combination from spinal cord injury and effect or narcotics. So far failed amitiza,linzess. Provided her with samples of Trulance to try after a clean out. She will call me in a week to inform if it is helping or not, if not helping will add lactulose to the same. Last option is to try an opoid antagonist.       Dr Wyline Mood  MD Follow up in 4-6 weeks

## 2016-09-19 ENCOUNTER — Other Ambulatory Visit: Payer: Self-pay | Admitting: Obstetrics and Gynecology

## 2016-09-19 ENCOUNTER — Other Ambulatory Visit: Payer: Self-pay | Admitting: Physical Medicine & Rehabilitation

## 2016-09-21 ENCOUNTER — Ambulatory Visit: Payer: BLUE CROSS/BLUE SHIELD | Admitting: Physical Therapy

## 2016-09-21 ENCOUNTER — Other Ambulatory Visit: Payer: Self-pay | Admitting: Obstetrics and Gynecology

## 2016-09-21 MED ORDER — NITROFURANTOIN MONOHYD MACRO 100 MG PO CAPS: 100.0000 mg | ORAL_CAPSULE | Freq: Two times a day (BID) | ORAL | 1 refills | Status: DC

## 2016-09-21 NOTE — Telephone Encounter (Signed)
Please advise 

## 2016-09-22 ENCOUNTER — Other Ambulatory Visit: Payer: Self-pay

## 2016-09-22 DIAGNOSIS — K5900 Constipation, unspecified: Secondary | ICD-10-CM

## 2016-09-22 MED ORDER — BARIUM SULFATE PO CAPS
1.0000 | ORAL_CAPSULE | Freq: Once | ORAL | 0 refills | Status: AC
Start: 2016-09-22 — End: 2016-09-22

## 2016-09-22 NOTE — Telephone Encounter (Signed)
Pts husband aware of medication being called in. He states [facility] had already called him last night.

## 2016-09-23 ENCOUNTER — Encounter: Payer: Self-pay | Admitting: Gastroenterology

## 2016-09-23 ENCOUNTER — Telehealth: Payer: Self-pay | Admitting: Registered Nurse

## 2016-09-23 ENCOUNTER — Telehealth: Payer: Self-pay | Admitting: Physical Medicine & Rehabilitation

## 2016-09-23 NOTE — Telephone Encounter (Signed)
Return Mr. Mahmood call, spoke to Mr. And Mrs Tonie Griffith, they state Ms. Taormina still experiencing constipation, she is following up with her Gastroenterologist. Also states when she was on hydrocodone she experienced less constipation.  Discuss the above with Dr. Riley Kill, she can be changed to Nucynta due to less side effects. Will review her insurance plan, if not covered will discuss with Dr. Riley Kill to change to hydrocodone. They were instructed to call office on Friday 4/20 for answer. She verbalizes understanding

## 2016-09-23 NOTE — Telephone Encounter (Signed)
If [name] insurance plan covers the  Nucynta her Oxycodone will be changed to 50 mg QID, if not covered she will be changed to Hydrocodone 10 mg QID, this was discuss with Dr. Riley Kill, he agrees with plan.

## 2016-09-23 NOTE — Telephone Encounter (Signed)
Patient still fighting her constipation issues and working with a specialist.  Patient is having to take something everyday for her constipation that was prescribed by the specialist.  Jasmine [name] seems to think it is coming from the higher medication that Dr. Riley Kill put her on in November.  The specialist is running tests on her and they are thinking about giving patient a colostomy bag.  Please call Jasmine [name] as soon as possible.

## 2016-09-23 NOTE — Telephone Encounter (Signed)
On 09/23/2016 the [place]CCSR was reviewed no conflict was seen on the [facility] Controlled Substance Reporting System with multiple prescribers. Ms. Wahlberg has a signed narcotic contract with our office. If there were any discrepancies this would have been reported to her physician.

## 2016-09-25 ENCOUNTER — Telehealth: Payer: Self-pay

## 2016-09-25 MED ORDER — TAPENTADOL HCL 50 MG PO TABS: 50.0000 mg | ORAL_TABLET | Freq: Four times a day (QID) | ORAL | 0 refills | Status: DC | PRN

## 2016-09-25 NOTE — Telephone Encounter (Signed)
Patient would like a call back from NP Jacalyn Lefevre. Patient states it is about  medication change and insurance coverage

## 2016-09-25 NOTE — Telephone Encounter (Signed)
Prior authorization submitted for Nucynta 50 mg QID #120, Prior authorization approved, Riley Lam notified

## 2016-09-25 NOTE — Telephone Encounter (Signed)
Placed a call to Jasmine [name], she was informed her Nucynta has been approved.  Instructed to bring in her Oxycodone, she verbalizes understanding. Her husband will pick up the prescription on 09/28/2016.

## 2016-09-27 ENCOUNTER — Encounter: Payer: Self-pay | Admitting: Gastroenterology

## 2016-09-28 ENCOUNTER — Ambulatory Visit
Admission: RE | Admit: 2016-09-28 | Discharge: 2016-09-28 | Disposition: A | Discharge status: Home / Self Care | Payer: BLUE CROSS/BLUE SHIELD | Source: Ambulatory Visit | Attending: Gastroenterology | Admitting: Gastroenterology

## 2016-09-28 ENCOUNTER — Other Ambulatory Visit: Payer: Self-pay | Admitting: *Deleted

## 2016-09-28 ENCOUNTER — Ambulatory Visit: Payer: BLUE CROSS/BLUE SHIELD | Admitting: Physical Therapy

## 2016-09-28 DIAGNOSIS — R143 Flatulence: Secondary | ICD-10-CM | POA: Insufficient documentation

## 2016-09-28 DIAGNOSIS — K5900 Constipation, unspecified: Secondary | ICD-10-CM

## 2016-09-28 MED ORDER — DULOXETINE HCL 30 MG PO CPEP: 30.0000 mg | ORAL_CAPSULE | Freq: Every day | ORAL | 0 refills | Status: DC

## 2016-09-28 MED ORDER — CVS B COMPLEX PLUS C PO TABS: 1.0000 | ORAL_TABLET | Freq: Every day | ORAL | 1 refills | Status: AC

## 2016-09-28 MED ORDER — PANTOPRAZOLE SODIUM 40 MG PO TBEC: 40.0000 mg | DELAYED_RELEASE_TABLET | Freq: Every day | ORAL | 0 refills | Status: DC

## 2016-09-30 ENCOUNTER — Other Ambulatory Visit: Payer: Self-pay | Admitting: Physical Medicine & Rehabilitation

## 2016-09-30 ENCOUNTER — Telehealth: Payer: Self-pay | Admitting: Gastroenterology

## 2016-09-30 NOTE — Telephone Encounter (Signed)
Sent you another message

## 2016-09-30 NOTE — Telephone Encounter (Signed)
-----   Message from Wyline Mood, MD sent at 09/30/2016 11:08 AM EDT ----- Inform   1. No significant stool when she has her symptoms 2. More gas 3. Start taking charcoal tablets over the counter 4. Continue trulance  5. Avoid digital exams  6. Advise to inform me how she feels in 5-7 days

## 2016-09-30 NOTE — Telephone Encounter (Signed)
Patient's husband calling for her xray results.

## 2016-09-30 NOTE — Telephone Encounter (Signed)
Informed spouse Apolinar Junes of instructions per Dr. Tobi Bastos.    1. No significant stool when she has her symptoms  2. More gas  3. Start taking charcoal tablets over the counter  4. Continue trulance  5. Avoid digital exams  6. Advise to inform me how she feels in 5-7 days

## 2016-10-02 ENCOUNTER — Telehealth: Payer: Self-pay | Admitting: *Deleted

## 2016-10-02 NOTE — Telephone Encounter (Signed)
Jasmine [name] called back. Jasmine [name] has not been sleeping since the Nucynta and is experiencing nausea.  She has an appt Monday with Dr Riley Kill.  They are going to try crackers and hopefully the symptoms will start to abate. She is also having increased sweating. I instructed him that if symptoms worsen they would need to go to ED over the weekend. He agrees.

## 2016-10-02 NOTE — Telephone Encounter (Signed)
Jasmine [name] would like for [facility] to call him.  Jasmine [name] is having some side effects from the Nucynta and pharmacist told them to let us know.

## 2016-10-02 NOTE — Telephone Encounter (Signed)
Left message that we were returning his call. 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 I asked him to leave [place] a detailed message of what effects she is having.

## 2016-10-03 ENCOUNTER — Other Ambulatory Visit: Payer: Self-pay | Admitting: Gastroenterology

## 2016-10-03 DIAGNOSIS — K5900 Constipation, unspecified: Secondary | ICD-10-CM

## 2016-10-05 ENCOUNTER — Telehealth: Payer: Self-pay | Admitting: *Deleted

## 2016-10-05 ENCOUNTER — Other Ambulatory Visit: Payer: Self-pay | Admitting: Obstetrics and Gynecology

## 2016-10-05 ENCOUNTER — Encounter: Payer: Self-pay | Admitting: Physical Medicine & Rehabilitation

## 2016-10-05 ENCOUNTER — Encounter
Payer: BLUE CROSS/BLUE SHIELD | Attending: Physical Medicine & Rehabilitation | Admitting: Physical Medicine & Rehabilitation

## 2016-10-05 VITALS — BP 110/76 | HR 103

## 2016-10-05 DIAGNOSIS — R252 Cramp and spasm: Secondary | ICD-10-CM | POA: Insufficient documentation

## 2016-10-05 DIAGNOSIS — N3289 Other specified disorders of bladder: Secondary | ICD-10-CM | POA: Diagnosis not present

## 2016-10-05 DIAGNOSIS — R200 Anesthesia of skin: Secondary | ICD-10-CM | POA: Insufficient documentation

## 2016-10-05 DIAGNOSIS — K219 Gastro-esophageal reflux disease without esophagitis: Secondary | ICD-10-CM | POA: Diagnosis not present

## 2016-10-05 DIAGNOSIS — R531 Weakness: Secondary | ICD-10-CM | POA: Diagnosis not present

## 2016-10-05 DIAGNOSIS — S14105S Unspecified injury at C5 level of cervical spinal cord, sequela: Secondary | ICD-10-CM

## 2016-10-05 DIAGNOSIS — R011 Cardiac murmur, unspecified: Secondary | ICD-10-CM | POA: Insufficient documentation

## 2016-10-05 DIAGNOSIS — K592 Neurogenic bowel, not elsewhere classified: Secondary | ICD-10-CM | POA: Diagnosis not present

## 2016-10-05 DIAGNOSIS — R51 Headache: Secondary | ICD-10-CM | POA: Insufficient documentation

## 2016-10-05 DIAGNOSIS — G8254 Quadriplegia, C5-C7 incomplete: Secondary | ICD-10-CM | POA: Diagnosis present

## 2016-10-05 DIAGNOSIS — M4722 Other spondylosis with radiculopathy, cervical region: Secondary | ICD-10-CM | POA: Diagnosis not present

## 2016-10-05 DIAGNOSIS — M5416 Radiculopathy, lumbar region: Secondary | ICD-10-CM

## 2016-10-05 DIAGNOSIS — S14109A Unspecified injury at unspecified level of cervical spinal cord, initial encounter: Secondary | ICD-10-CM | POA: Diagnosis not present

## 2016-10-05 DIAGNOSIS — R52 Pain, unspecified: Secondary | ICD-10-CM | POA: Diagnosis present

## 2016-10-05 DIAGNOSIS — G4700 Insomnia, unspecified: Secondary | ICD-10-CM | POA: Diagnosis not present

## 2016-10-05 MED ORDER — METOCLOPRAMIDE HCL 5 MG PO TABS: 5.0000 mg | ORAL_TABLET | Freq: Three times a day (TID) | ORAL | 4 refills | Status: DC

## 2016-10-05 MED ORDER — TAPENTADOL HCL 75 MG PO TABS: 75.0000 mg | ORAL_TABLET | Freq: Four times a day (QID) | ORAL | 0 refills | Status: DC | PRN

## 2016-10-05 NOTE — Telephone Encounter (Signed)
Called to report drug interaction warning between duloxetine and reglan. I consulted Dr. Riley Kill. He said it was okay to proceed. Contacted pharmacy and gave the approval
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
## 2016-10-05 NOTE — Patient Instructions (Signed)
PLEASE FEEL FREE TO CALL OUR OFFICE WITH ANY PROBLEMS OR QUESTIONS [phone])    SPLIT THE  NUCYNTAS IN HALF AND ADD TO A  TABS.

## 2016-10-06 ENCOUNTER — Ambulatory Visit: Payer: BLUE CROSS/BLUE SHIELD | Attending: Physical Medicine & Rehabilitation | Admitting: Physical Therapy

## 2016-10-06 ENCOUNTER — Encounter: Payer: Self-pay | Admitting: Physical Therapy

## 2016-10-06 DIAGNOSIS — G8250 Quadriplegia, unspecified: Secondary | ICD-10-CM | POA: Diagnosis present

## 2016-10-06 DIAGNOSIS — M6281 Muscle weakness (generalized): Secondary | ICD-10-CM

## 2016-10-06 DIAGNOSIS — R293 Abnormal posture: Secondary | ICD-10-CM | POA: Insufficient documentation
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
## 2016-10-09 ENCOUNTER — Other Ambulatory Visit: Payer: Self-pay | Admitting: Physical Medicine & Rehabilitation

## 2016-10-09 DIAGNOSIS — S14105S Unspecified injury at C5 level of cervical spinal cord, sequela: Secondary | ICD-10-CM

## 2016-10-09 DIAGNOSIS — G8250 Quadriplegia, unspecified: Secondary | ICD-10-CM

## 2016-10-12 ENCOUNTER — Ambulatory Visit: Payer: BLUE CROSS/BLUE SHIELD | Admitting: Rehabilitation

## 2016-10-12 ENCOUNTER — Telehealth: Payer: Self-pay | Admitting: Rehabilitation

## 2016-10-12 ENCOUNTER — Encounter: Payer: Self-pay | Admitting: Rehabilitation

## 2016-10-12 DIAGNOSIS — G8250 Quadriplegia, unspecified: Secondary | ICD-10-CM

## 2016-10-12 DIAGNOSIS — M6281 Muscle weakness (generalized): Secondary | ICD-10-CM | POA: Diagnosis not present

## 2016-10-12 DIAGNOSIS — R293 Abnormal posture: Secondary | ICD-10-CM

## 2016-10-12 NOTE — Telephone Encounter (Signed)
Dr. Riley KillSwartz,   I am seeing Jasmine [name] at [facility] for PT.  Note her recent botox and lack of functional use and pain in LUE.  Feel that she would greatly benefit from OT evaluation.  Please place order in epic of you agree.  Also note that she needs a more custom w/c that is more lightweight and appropriately sized for her in order to be more independent in the house.  Please also write separate order for w/c evaluation if you agree.    Thanks,  Harriet ButteEmily Andray Assefa, PT, MPT [facility] [address] [place], [place], [address] Phone: [phone]   Fax:  [phone] 10/12/16, 5:01 PM
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
## 2016-10-14 NOTE — Telephone Encounter (Signed)
Referral made,  thx

## 2016-10-19 DIAGNOSIS — N390 Urinary tract infection, site not specified: Secondary | ICD-10-CM | POA: Insufficient documentation

## 2016-10-21 ENCOUNTER — Ambulatory Visit: Payer: BLUE CROSS/BLUE SHIELD | Admitting: Gastroenterology

## 2016-10-22 ENCOUNTER — Ambulatory Visit: Payer: BLUE CROSS/BLUE SHIELD | Admitting: Physical Therapy

## 2016-10-26 ENCOUNTER — Ambulatory Visit: Payer: BLUE CROSS/BLUE SHIELD | Admitting: Rehabilitation

## 2016-10-28 ENCOUNTER — Other Ambulatory Visit: Payer: Self-pay | Admitting: Physical Medicine & Rehabilitation

## 2016-10-28 NOTE — Telephone Encounter (Signed)
Spoke with dr about amount of medication, so it can be increased on amount to a 1 month supply instead of a two week supply, he agreed with the change and also stated that this medication can be purchased over the counter as well, called patients husband and told him of advise and changes.

## 2016-11-03 ENCOUNTER — Ambulatory Visit: Payer: BLUE CROSS/BLUE SHIELD | Admitting: Physical Therapy

## 2016-11-04 ENCOUNTER — Telehealth: Payer: Self-pay | Admitting: Obstetrics and Gynecology

## 2016-11-04 ENCOUNTER — Other Ambulatory Visit: Payer: Self-pay | Admitting: Obstetrics and Gynecology

## 2016-11-04 ENCOUNTER — Encounter: Payer: Self-pay | Admitting: Physical Medicine & Rehabilitation

## 2016-11-04 ENCOUNTER — Encounter
Payer: BLUE CROSS/BLUE SHIELD | Attending: Physical Medicine & Rehabilitation | Admitting: Physical Medicine & Rehabilitation

## 2016-11-04 ENCOUNTER — Other Ambulatory Visit: Payer: BLUE CROSS/BLUE SHIELD

## 2016-11-04 VITALS — BP 116/77 | HR 84

## 2016-11-04 DIAGNOSIS — R531 Weakness: Secondary | ICD-10-CM | POA: Insufficient documentation

## 2016-11-04 DIAGNOSIS — N3289 Other specified disorders of bladder: Secondary | ICD-10-CM | POA: Insufficient documentation

## 2016-11-04 DIAGNOSIS — S14109A Unspecified injury at unspecified level of cervical spinal cord, initial encounter: Secondary | ICD-10-CM | POA: Diagnosis not present

## 2016-11-04 DIAGNOSIS — R52 Pain, unspecified: Secondary | ICD-10-CM | POA: Diagnosis present

## 2016-11-04 DIAGNOSIS — M4722 Other spondylosis with radiculopathy, cervical region: Secondary | ICD-10-CM

## 2016-11-04 DIAGNOSIS — K219 Gastro-esophageal reflux disease without esophagitis: Secondary | ICD-10-CM | POA: Insufficient documentation

## 2016-11-04 DIAGNOSIS — R011 Cardiac murmur, unspecified: Secondary | ICD-10-CM | POA: Insufficient documentation

## 2016-11-04 DIAGNOSIS — R300 Dysuria: Secondary | ICD-10-CM

## 2016-11-04 DIAGNOSIS — N319 Neuromuscular dysfunction of bladder, unspecified: Secondary | ICD-10-CM | POA: Diagnosis not present

## 2016-11-04 DIAGNOSIS — S14105S Unspecified injury at C5 level of cervical spinal cord, sequela: Secondary | ICD-10-CM

## 2016-11-04 DIAGNOSIS — Z79899 Other long term (current) drug therapy: Secondary | ICD-10-CM

## 2016-11-04 DIAGNOSIS — R252 Cramp and spasm: Secondary | ICD-10-CM | POA: Diagnosis not present

## 2016-11-04 DIAGNOSIS — R200 Anesthesia of skin: Secondary | ICD-10-CM | POA: Insufficient documentation

## 2016-11-04 DIAGNOSIS — G8254 Quadriplegia, C5-C7 incomplete: Secondary | ICD-10-CM | POA: Diagnosis present

## 2016-11-04 DIAGNOSIS — G8250 Quadriplegia, unspecified: Secondary | ICD-10-CM

## 2016-11-04 DIAGNOSIS — Z5181 Encounter for therapeutic drug level monitoring: Secondary | ICD-10-CM | POA: Diagnosis not present

## 2016-11-04 DIAGNOSIS — G4700 Insomnia, unspecified: Secondary | ICD-10-CM | POA: Diagnosis not present

## 2016-11-04 DIAGNOSIS — M4802 Spinal stenosis, cervical region: Secondary | ICD-10-CM

## 2016-11-04 DIAGNOSIS — M5416 Radiculopathy, lumbar region: Secondary | ICD-10-CM

## 2016-11-04 DIAGNOSIS — N390 Urinary tract infection, site not specified: Secondary | ICD-10-CM

## 2016-11-04 DIAGNOSIS — R51 Headache: Secondary | ICD-10-CM | POA: Insufficient documentation

## 2016-11-04 MED ORDER — DANTROLENE SODIUM 100 MG PO CAPS: 100.0000 mg | ORAL_CAPSULE | Freq: Four times a day (QID) | ORAL | 4 refills | Status: DC

## 2016-11-04 MED ORDER — TAPENTADOL HCL 75 MG PO TABS: 75.0000 mg | ORAL_TABLET | Freq: Four times a day (QID) | ORAL | 0 refills | Status: DC | PRN

## 2016-11-04 NOTE — Telephone Encounter (Signed)
Husband dropped off urine. Please put in an order for culture.

## 2016-11-04 NOTE — Telephone Encounter (Signed)
Order is in.

## 2016-11-04 NOTE — Patient Instructions (Signed)
PLEASE FEEL FREE TO CALL OUR OFFICE WITH ANY PROBLEMS OR QUESTIONS ([phone])      

## 2016-11-04 NOTE — Telephone Encounter (Signed)
Please advise 

## 2016-11-04 NOTE — Telephone Encounter (Signed)
Urine has been sent for Culture
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
## 2016-11-04 NOTE — Telephone Encounter (Signed)
Pt's Husband is calling about needing bring a urine specimen by due to her wife being in a lot of pain. Please advise

## 2016-11-05 ENCOUNTER — Telehealth: Payer: Self-pay

## 2016-11-05 NOTE — Telephone Encounter (Signed)
Pt aware, through patient portal, I sent the urine for a culture and results be back tomorrow.

## 2016-11-05 NOTE — Telephone Encounter (Signed)
Pt calling triage for Kim. Wondering about her urine sample that she dropped off yesterday. Please advise pt.

## 2016-11-06 ENCOUNTER — Encounter: Payer: Self-pay | Admitting: Obstetrics and Gynecology

## 2016-11-06 LAB — URINE CULTURE

## 2016-11-09 ENCOUNTER — Ambulatory Visit: Payer: BLUE CROSS/BLUE SHIELD | Admitting: Rehabilitation

## 2016-11-09 LAB — DRUG TOX MONITOR 1 W/CONF, ORAL FLD
Amphetamines: NEGATIVE ng/mL (ref <10)
BUPRENORPHINE: NEGATIVE ng/mL (ref <0.025)
Barbiturates: NEGATIVE ng/mL (ref <10)
Benzodiazepines: NEGATIVE ng/mL (ref <0.50)
Cocaine: NEGATIVE ng/mL (ref <2.5)
FENTANYL: NEGATIVE ng/mL (ref <0.10)
HEROIN METABOLITE: NEGATIVE ng/mL (ref <1.0)
MARIJUANA: NEGATIVE ng/mL (ref <2.5)
MDMA: NEGATIVE ng/mL (ref <10)
MEPROBAMATE: NEGATIVE ng/mL (ref <2.5)
METHADONE: NEGATIVE ng/mL (ref <5.0)
Meperidine: NEGATIVE ng/mL (ref <5.0)
Nicotine Metabolite: NEGATIVE ng/mL (ref <5.0)
Opiates: NEGATIVE ng/mL (ref <2.5)
PROPOXYPHENE: NEGATIVE ng/mL (ref <5.0)
Phencyclidine: NEGATIVE ng/mL (ref <10)
TAPENTADOL: 25.7 ng/mL — AB (ref <5.0)
TAPENTADOL: POSITIVE ng/mL — AB (ref <5.0)
TRAMADOL: NEGATIVE ng/mL (ref <5.0)
Zolpidem: NEGATIVE ng/mL (ref <5.0)

## 2016-11-09 LAB — DRUG TOX METHYLPHEN W/CONF,ORAL FLD: METHYLPHENIDATE: NEGATIVE ng/mL (ref <1.0)

## 2016-11-09 LAB — DRUG TOX ALC METAB W/CON, ORAL FLD: ALCOHOL METABOLITE: NEGATIVE ng/mL (ref <25)

## 2016-11-11 ENCOUNTER — Telehealth: Payer: Self-pay | Admitting: *Deleted

## 2016-11-11 NOTE — Telephone Encounter (Signed)
Oral swab drug screen for this encounter is consistent for prescribed medication. 

## 2016-11-12 ENCOUNTER — Telehealth: Payer: Self-pay

## 2016-11-12 NOTE — Telephone Encounter (Signed)
[facility] [phone]  Requesting verbal orders for 1xwk X 8wks, called her back and left voicemail to call us back due to not knowing if that is a confidential voicemail box.

## 2016-11-13 ENCOUNTER — Telehealth: Payer: Self-pay | Admitting: *Deleted

## 2016-11-13 NOTE — Telephone Encounter (Signed)
Called CortlandErin today and verified and approved verbal orders

## 2016-11-13 NOTE — Telephone Encounter (Signed)
[facility]  Is asking for confirmation of medical diagnosis and DOI.  They had 10/10/15.  [facility] I see the DOI is actually 09/22/12 and her diagnosis S14.105S, G82.50. I notified Erin by secure VM.

## 2016-11-16 ENCOUNTER — Ambulatory Visit: Payer: BLUE CROSS/BLUE SHIELD | Admitting: Rehabilitation

## 2016-11-20 ENCOUNTER — Other Ambulatory Visit: Payer: Self-pay

## 2016-11-20 DIAGNOSIS — Z5181 Encounter for therapeutic drug level monitoring: Secondary | ICD-10-CM

## 2016-11-20 DIAGNOSIS — G8250 Quadriplegia, unspecified: Secondary | ICD-10-CM

## 2016-11-20 DIAGNOSIS — M5416 Radiculopathy, lumbar region: Secondary | ICD-10-CM

## 2016-11-20 DIAGNOSIS — S14105S Unspecified injury at C5 level of cervical spinal cord, sequela: Secondary | ICD-10-CM

## 2016-11-20 DIAGNOSIS — M4802 Spinal stenosis, cervical region: Secondary | ICD-10-CM

## 2016-11-20 DIAGNOSIS — M4722 Other spondylosis with radiculopathy, cervical region: Secondary | ICD-10-CM

## 2016-11-20 MED ORDER — DICLOFENAC SODIUM 75 MG PO TBEC: 75.0000 mg | DELAYED_RELEASE_TABLET | Freq: Two times a day (BID) | ORAL | 3 refills | Status: DC

## 2016-11-23 ENCOUNTER — Ambulatory Visit: Payer: BLUE CROSS/BLUE SHIELD | Admitting: Rehabilitation

## 2016-11-24 ENCOUNTER — Other Ambulatory Visit: Payer: Self-pay | Admitting: Physical Medicine & Rehabilitation

## 2016-11-30 ENCOUNTER — Ambulatory Visit: Payer: BLUE CROSS/BLUE SHIELD | Admitting: Rehabilitation

## 2016-11-30 ENCOUNTER — Other Ambulatory Visit: Payer: Self-pay

## 2016-11-30 ENCOUNTER — Encounter: Payer: Self-pay | Admitting: Gastroenterology

## 2016-11-30 DIAGNOSIS — K592 Neurogenic bowel, not elsewhere classified: Secondary | ICD-10-CM

## 2016-11-30 DIAGNOSIS — K5901 Slow transit constipation: Secondary | ICD-10-CM

## 2016-11-30 MED ORDER — PHENYLEPHRINE IN HARD FAT 0.25 % RE SUPP: 1.0000 | Freq: Two times a day (BID) | RECTAL | 0 refills | Status: DC

## 2016-11-30 MED ORDER — HYDROCORTISONE ACETATE 25 MG RE SUPP: 25.0000 mg | Freq: Two times a day (BID) | RECTAL | 1 refills | Status: DC

## 2016-11-30 NOTE — Telephone Encounter (Signed)
-----   Message from Wyline MoodKiran Anna, MD sent at 11/30/2016  1:14 PM EDT ----- Regarding: RE: Syzminski Response 1. Avoid digital manipulation  2. Suggest 7 day course of anusol supp 3. Daily sit in warm water tub  4. Is the charcoal tablets helping with the gas? 5. Would she be interested to have ano rectal manometry tried to see if she is suffering from excess contraction of her anus .  6. No evidence of ulcerative colitis seen on colonoscopy in 08/2016    Let me know her throughts   ----- Message ----- From: Ethlyn Galleryarter, Berlyn Malina, CMA Sent: 11/30/2016  11:29 AM To: Wyline MoodKiran Anna, MD Subject: Seabron SpatesSyzminski Response                             Hi Dr. Tobi BastosAnna,   The family is calling and texting about a response.   Please advise.  Thanks   ----- Message ----- From: Willy EddyBrenda Schiraldi Sent: 11/30/2016  5:04 AM To: [facility] Subject: Visit Follow-Up Question             Dr Tobi BastosAnna,  My life is being consumed and I have severe pain in my lower bowel to anus really bad. I haven't been able to do physical therapy for 7 days. My pain level is a 9 or 10 every day. I can't sit in my wheel chair or even lay in bed. I cleaned out with a laxative for 2 days last Thursday & Friday. I feel all the Suppositories and enemas are irritating me and causing inflammation. Please help me by prescribing me a antibiotic or medication to help with the pain and urge to go every second. I believe I may have IBD or Tenesmus it fits my symptoms perfectly.  It's a worrying and bothersome sensation frequently having the urge to go to the bathroom, even though your bowels are empty. If you have inflammatory bowel disease (IBD), you may be all too familiar with this symptom, which is called tenesmus. Fortunately, your doctor can help you manage it. Tenesmus is difficult to ignore, and running to the bathroom repeatedly affects quality of life significantly

## 2016-12-01 ENCOUNTER — Other Ambulatory Visit: Payer: Self-pay

## 2016-12-01 ENCOUNTER — Telehealth: Payer: Self-pay | Admitting: Gastroenterology

## 2016-12-01 DIAGNOSIS — R140 Abdominal distension (gaseous): Secondary | ICD-10-CM

## 2016-12-01 MED ORDER — SIMETHICONE EXTRA STRENGTH 125 MG PO CAPS: 125.0000 mg | ORAL_CAPSULE | Freq: Three times a day (TID) | ORAL | 3 refills | Status: DC

## 2016-12-01 NOTE — Telephone Encounter (Signed)
Patients husband called and stated he needed to talk to you and you would know why. Please call

## 2016-12-02 ENCOUNTER — Emergency Department: Payer: BLUE CROSS/BLUE SHIELD

## 2016-12-02 ENCOUNTER — Emergency Department
Admission: EM | Admit: 2016-12-02 | Discharge: 2016-12-02 | Disposition: A | Discharge status: Home / Self Care | Payer: BLUE CROSS/BLUE SHIELD | Attending: Student in an Organized Health Care Education/Training Program | Admitting: Student in an Organized Health Care Education/Training Program

## 2016-12-02 ENCOUNTER — Encounter: Payer: Self-pay | Admitting: *Deleted

## 2016-12-02 DIAGNOSIS — R102 Pelvic and perineal pain: Secondary | ICD-10-CM | POA: Insufficient documentation

## 2016-12-02 DIAGNOSIS — Z79899 Other long term (current) drug therapy: Secondary | ICD-10-CM | POA: Insufficient documentation

## 2016-12-02 DIAGNOSIS — N3000 Acute cystitis without hematuria: Secondary | ICD-10-CM

## 2016-12-02 DIAGNOSIS — K5903 Drug induced constipation: Secondary | ICD-10-CM

## 2016-12-02 DIAGNOSIS — M533 Sacrococcygeal disorders, not elsewhere classified: Secondary | ICD-10-CM | POA: Insufficient documentation

## 2016-12-02 DIAGNOSIS — M549 Dorsalgia, unspecified: Secondary | ICD-10-CM

## 2016-12-02 LAB — URINALYSIS, COMPLETE (UACMP) WITH MICROSCOPIC
Bilirubin Urine: NEGATIVE
Glucose, UA: NEGATIVE mg/dL
Hgb urine dipstick: NEGATIVE
Ketones, ur: NEGATIVE mg/dL
Leukocytes, UA: NEGATIVE
Nitrite: POSITIVE — AB
PROTEIN: NEGATIVE mg/dL
Specific Gravity, Urine: 1.012 (ref 1.005–1.030)
pH: 7 (ref 5.0–8.0)

## 2016-12-02 LAB — COMPREHENSIVE METABOLIC PANEL
ALBUMIN: 4.2 g/dL (ref 3.5–5.0)
ALK PHOS: 81 U/L (ref 38–126)
ALT: 20 U/L (ref 14–54)
ANION GAP: 9 (ref 5–15)
AST: 24 U/L (ref 15–41)
BUN: 15 mg/dL (ref 6–20)
CO2: 26 mmol/L (ref 22–32)
Calcium: 9.2 mg/dL (ref 8.9–10.3)
Chloride: 103 mmol/L (ref 101–111)
Creatinine, Ser: 0.4 mg/dL — ABNORMAL LOW (ref 0.44–1.00)
GFR calc Af Amer: >60 mL/min (ref >60)
GFR calc non Af Amer: >60 mL/min (ref >60)
GLUCOSE: 113 mg/dL — AB (ref 65–99)
Potassium: 4.2 mmol/L (ref 3.5–5.1)
SODIUM: 138 mmol/L (ref 135–145)
Total Bilirubin: 0.8 mg/dL (ref 0.3–1.2)
Total Protein: 7.5 g/dL (ref 6.5–8.1)

## 2016-12-02 LAB — CBC
HCT: 35.9 % (ref 35.0–47.0)
HEMOGLOBIN: 12.1 g/dL (ref 12.0–16.0)
MCH: 29 pg (ref 26.0–34.0)
MCHC: 33.7 g/dL (ref 32.0–36.0)
MCV: 85.9 fL (ref 80.0–100.0)
Platelets: 253 10*3/uL (ref 150–440)
RBC: 4.17 MIL/uL (ref 3.80–5.20)
RDW: 13.2 % (ref 11.5–14.5)
WBC: 10.9 10*3/uL (ref 3.6–11.0)

## 2016-12-02 LAB — PREGNANCY, URINE: PREG TEST UR: NEGATIVE

## 2016-12-02 LAB — LIPASE, BLOOD: Lipase: 23 U/L (ref 11–51)

## 2016-12-02 MED ORDER — IOPAMIDOL (ISOVUE-300) INJECTION 61%
100.0000 mL | Freq: Once | INTRAVENOUS | Status: AC | PRN
  Administered 2016-12-02: 100 mL via INTRAVENOUS

## 2016-12-02 MED ORDER — MORPHINE SULFATE (PF) 4 MG/ML IV SOLN
4.0000 mg | INTRAVENOUS | Status: DC | PRN
  Administered 2016-12-02: 4 mg via INTRAVENOUS
  Filled 2016-12-02: qty 1

## 2016-12-02 MED ORDER — KETOROLAC TROMETHAMINE 10 MG PO TABS: 10.0000 mg | ORAL_TABLET | Freq: Three times a day (TID) | ORAL | 0 refills | Status: DC | PRN

## 2016-12-02 MED ORDER — CEPHALEXIN 500 MG PO CAPS
500.0000 mg | ORAL_CAPSULE | Freq: Once | ORAL | Status: AC
  Administered 2016-12-02: 500 mg via ORAL
  Filled 2016-12-02: qty 1

## 2016-12-02 MED ORDER — KETOROLAC TROMETHAMINE 30 MG/ML IJ SOLN
30.0000 mg | Freq: Once | INTRAMUSCULAR | Status: AC
  Administered 2016-12-02: 30 mg via INTRAVENOUS
  Filled 2016-12-02: qty 1

## 2016-12-02 MED ORDER — CEPHALEXIN 500 MG PO CAPS: 500.0000 mg | ORAL_CAPSULE | Freq: Four times a day (QID) | ORAL | 0 refills | Status: AC

## 2016-12-02 NOTE — ED Provider Notes (Signed)
This patient was signed out to me by Dr. Willy EddyPatrick Robinson. 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 The patient underwent MRI which did show some inflammation around the coccyx that was consistent with coccydynia. I've spoken to the patient, and she is not been having any fevers, her white blood cell count is normal here. She has no evidence of decubitus ulcer or other skin breakdown on her bottom. At this time, these changes are most likely consistent with an inflammatory response from constant pressure as the patient is always laying down or in her chair, rather than an acute infection or osteomyelitis. She understands that she needs to keep a close eye on the skin around her bottom, for any systemic symptoms, and decreased pressure on her bottom as much as possible. In addition, the patient does have a urinary tract infection. Her previous cultures have not grown anything but I will start her on Keflex tonight, and give her prescription for a seven-day course of treatment. A urine culture has been sent and she has instructions to have her primary care physician follow-up the results. At this time, the patient is stable for discharge. Return precautions as well as follow-up instructions were discussed with the patient and her husband.   Rockne MenghiniNorman, Anne-Caroline, MD 12/02/16 [phone]

## 2016-12-02 NOTE — Discharge Instructions (Signed)
Please take the entire course of antibiotics, even if you're feeling better. Please have your primary care physician look up the results of your urine culture.  You may take Toradol for your pain. Do not take other NSAID medications when you're taking this medication. Stop taking diclofenac, do not take Aleve, Advil, Motrin, ibuprofen. Take this medication with a small amount of food to prevent irritation of the stomach.  Return to the emergency department if you develop severe pain, fever, breakdown of the skin around your bottom, vomiting, or any other symptoms concerning to you.

## 2016-12-02 NOTE — ED Notes (Addendum)
Pt has abd pain and rectal pain.  Paraplegic.  No v/d.  Pt reports pain in tailbone for 10 days..  No injury to tailbone.  Pt taking azo and monistat for uti and yeast.  Pt alert.  Pt had neck fusion c5-c6 at [facility] in 10/2015, paralyzed since surgery.

## 2016-12-02 NOTE — ED Notes (Signed)
Iv inserted by dr Roxan Hockeyrobinson with u/s  Pt tolerated well.  Pt to ct scan.
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
## 2016-12-02 NOTE — ED Notes (Signed)
Pt in mri 

## 2016-12-02 NOTE — ED Triage Notes (Signed)
States rectal pressure, states constantly feeling as if she has to urinate, pt is paralyzed from the waist down, wheelchair bound, pt was seen by GI yesterday and given gas x but states continued abd distention, pt has hemorroids and is currently getting suppositorys, family with pt, awake and alert, pt incontinent of bowel and bladder, denies any blood, states recent yeast infection

## 2016-12-02 NOTE — ED Notes (Signed)
[place]  [place] alert.  Oxygen sats low, placed on 3liters oxygen.  Family with pt.  Iv in place

## 2016-12-05 LAB — URINE CULTURE

## 2016-12-06 ENCOUNTER — Other Ambulatory Visit: Payer: Self-pay | Admitting: Physical Medicine & Rehabilitation

## 2016-12-06 DIAGNOSIS — K592 Neurogenic bowel, not elsewhere classified: Secondary | ICD-10-CM

## 2016-12-08 ENCOUNTER — Other Ambulatory Visit: Payer: Self-pay | Admitting: Physical Medicine & Rehabilitation

## 2016-12-08 DIAGNOSIS — G8250 Quadriplegia, unspecified: Secondary | ICD-10-CM

## 2016-12-08 DIAGNOSIS — S14105S Unspecified injury at C5 level of cervical spinal cord, sequela: Secondary | ICD-10-CM

## 2016-12-16 ENCOUNTER — Encounter
Payer: BLUE CROSS/BLUE SHIELD | Attending: Physical Medicine & Rehabilitation | Admitting: Physical Medicine & Rehabilitation

## 2016-12-16 ENCOUNTER — Encounter: Payer: Self-pay | Admitting: Physical Medicine & Rehabilitation

## 2016-12-16 ENCOUNTER — Telehealth: Payer: Self-pay | Admitting: *Deleted

## 2016-12-16 VITALS — BP 132/86 | HR 83

## 2016-12-16 DIAGNOSIS — S14109A Unspecified injury at unspecified level of cervical spinal cord, initial encounter: Secondary | ICD-10-CM | POA: Insufficient documentation

## 2016-12-16 DIAGNOSIS — K219 Gastro-esophageal reflux disease without esophagitis: Secondary | ICD-10-CM | POA: Insufficient documentation

## 2016-12-16 DIAGNOSIS — G8254 Quadriplegia, C5-C7 incomplete: Secondary | ICD-10-CM

## 2016-12-16 DIAGNOSIS — R51 Headache: Secondary | ICD-10-CM | POA: Insufficient documentation

## 2016-12-16 DIAGNOSIS — R531 Weakness: Secondary | ICD-10-CM | POA: Insufficient documentation

## 2016-12-16 DIAGNOSIS — R200 Anesthesia of skin: Secondary | ICD-10-CM | POA: Diagnosis not present

## 2016-12-16 DIAGNOSIS — N3289 Other specified disorders of bladder: Secondary | ICD-10-CM | POA: Diagnosis not present

## 2016-12-16 DIAGNOSIS — M4722 Other spondylosis with radiculopathy, cervical region: Secondary | ICD-10-CM

## 2016-12-16 DIAGNOSIS — R252 Cramp and spasm: Secondary | ICD-10-CM | POA: Diagnosis not present

## 2016-12-16 DIAGNOSIS — M5416 Radiculopathy, lumbar region: Secondary | ICD-10-CM | POA: Diagnosis not present

## 2016-12-16 DIAGNOSIS — R52 Pain, unspecified: Secondary | ICD-10-CM | POA: Diagnosis present

## 2016-12-16 DIAGNOSIS — F4323 Adjustment disorder with mixed anxiety and depressed mood: Secondary | ICD-10-CM

## 2016-12-16 DIAGNOSIS — G4700 Insomnia, unspecified: Secondary | ICD-10-CM | POA: Insufficient documentation

## 2016-12-16 DIAGNOSIS — S14105S Unspecified injury at C5 level of cervical spinal cord, sequela: Secondary | ICD-10-CM | POA: Diagnosis not present

## 2016-12-16 DIAGNOSIS — R011 Cardiac murmur, unspecified: Secondary | ICD-10-CM | POA: Insufficient documentation

## 2016-12-16 MED ORDER — SULINDAC 150 MG PO TABS: 150.0000 mg | ORAL_TABLET | Freq: Two times a day (BID) | ORAL | 2 refills | Status: DC

## 2016-12-16 MED ORDER — DULOXETINE HCL 60 MG PO CPEP: 60.0000 mg | ORAL_CAPSULE | Freq: Every day | ORAL | 3 refills | Status: DC

## 2016-12-16 MED ORDER — TAPENTADOL HCL 75 MG PO TABS: 75.0000 mg | ORAL_TABLET | Freq: Four times a day (QID) | ORAL | 0 refills | Status: DC | PRN

## 2016-12-16 NOTE — Telephone Encounter (Signed)
[name] RN [name] called and said the the phy therapist has concerns about Jasmine [name]'s depression and feels it has worsened and is requesting an order for psych nurse eval.  Please advise. [name](Jasmine [name] has an appt and is here to see Dr Riley KillSwartz at present. He will be informed.

## 2016-12-16 NOTE — Patient Instructions (Signed)
PLEASE FEEL FREE TO CALL OUR OFFICE WITH ANY PROBLEMS OR QUESTIONS ([phone])      

## 2016-12-16 NOTE — Progress Notes (Signed)
Botox Injection for spasticity using needle EMG guidance Indication: Spinal cord injury at C5-C7 level without injury of spinal bone, sequela (HCC) - Plan: sulindac (CLINORIL) 150 MG tablet, tapentadol HCl (NUCYNTA) 75 MG tablet  Chronic lumbar radiculopathy - Plan: sulindac (CLINORIL) 150 MG tablet, tapentadol HCl (NUCYNTA) 75 MG tablet  C5-C7 incomplete quadriplegia (HCC) - Plan: sulindac (CLINORIL) 150 MG tablet  Adjustment disorder with mixed anxiety and depressed mood - Plan: DULoxetine (CYMBALTA) 60 MG capsule, Ambulatory referral to Neuropsychology  Cervical radiculopathy due to degenerative joint disease of spine - Plan: tapentadol HCl (NUCYNTA) 75 MG tablet   Dilution: 100 Units/ml        Total Units Injected: 500 Indication: Severe spasticity which interferes with ADL,mobility and/or  hygiene and is unresponsive to medication management and other conservative care Informed consent was obtained after describing risks and benefits of the procedure with the patient. This includes bleeding, bruising, infection, excessive weakness, or medication side effects. A REMS form is on file and signed.  Needle: 50mm injectable monopolar needle electrode  Number of units per muscle Pectoralis Major 0 units Pectoralis Minor 0 units Biceps 0 units Brachioradialis 0 units FCR 0 units FCU 0 units FDS 50 units left and 50 units right FDP 50 units left and 50 units right FPL 0 units Pronator Teres 0 units Pronator Quadratus 0 units Quadriceps 0 units Gastroc/soleus 0 units Hamstrings 300 units on left between semimembranosus and semintendinosus 200u and biceps femoris 100u Tibialis Posterior 0 units EHL 0 units All injections were done after obtaining appropriate EMG activity and after negative drawback for blood. The patient tolerated the procedure well. Post procedure instructions were given. Return in about 1 month (around 01/16/2017) for me or np.    Increased cymbalta for depression to  60mg  Trial of clinoril for pain, 150mg  BID Referral to Dr. Kieth Brightlyodenbough for depression/anxiety

## 2016-12-18 ENCOUNTER — Encounter: Payer: Self-pay | Admitting: Gastroenterology

## 2016-12-21 ENCOUNTER — Encounter (HOSPITAL_BASED_OUTPATIENT_CLINIC_OR_DEPARTMENT_OTHER): Payer: BLUE CROSS/BLUE SHIELD | Admitting: Psychology

## 2016-12-21 ENCOUNTER — Telehealth: Payer: Self-pay | Admitting: *Deleted

## 2016-12-21 DIAGNOSIS — F4323 Adjustment disorder with mixed anxiety and depressed mood: Secondary | ICD-10-CM | POA: Diagnosis not present

## 2016-12-21 DIAGNOSIS — G8254 Quadriplegia, C5-C7 incomplete: Secondary | ICD-10-CM

## 2016-12-21 DIAGNOSIS — S14105S Unspecified injury at C5 level of cervical spinal cord, sequela: Secondary | ICD-10-CM | POA: Diagnosis not present

## 2016-12-21 NOTE — Telephone Encounter (Signed)
[name] is here to see Dr Kieth Brightlyodenbough and they wanted Dr Riley KillSwartz to know that the new anti inflammatory sulindac has not changed anything, and they nucynta is not helping her pain. She felt much better on the oxycodone even though she was having the constipation issues.  Please advise.
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
## 2016-12-21 NOTE — Telephone Encounter (Signed)
The nucynta is not a "new" rx. We have discussed her response to this medication in the past. Can increase nucynta to 100mg  at next refill (or change back to oxycodone). First and foremost, we need to improve her mood.

## 2016-12-22 NOTE — Telephone Encounter (Signed)
Spoke with patient. She understood, however, she asks if we could increase the nucynta dosage now. She says that may help in making a determination if an increased dose of nucynta is effective versus switching back to oxycodone prior to next visit with eunice

## 2016-12-23 MED ORDER — TAPENTADOL HCL 100 MG PO TABS: 100.0000 mg | ORAL_TABLET | Freq: Four times a day (QID) | ORAL | 0 refills | Status: DC | PRN

## 2016-12-23 NOTE — Telephone Encounter (Signed)
I have written for the 100mg  nucynta.

## 2016-12-23 NOTE — Telephone Encounter (Signed)
Contacted husbands mobile phone, left a message stating that script was available for pick up.  Contacted patient as well to inform that script was available for pick up.

## 2016-12-24 ENCOUNTER — Encounter: Payer: Self-pay | Admitting: Rehabilitation

## 2016-12-24 NOTE — Therapy (Signed)
[facility] [address] [place], [place], [address] Phone: [phone]   Fax:  [phone]  Patient Details  Name: Jasmine [name] MRN: [id number] Date of Birth: [birth year] Referring Provider:  No ref. provider found  Encounter Date: 12/24/2016  PHYSICAL THERAPY DISCHARGE SUMMARY  Visits from Start of Care: 4  Current functional level related to goals / functional outcomes:     PT Long Term Goals - 09/11/16 0659      PT LONG TERM GOAL #1   Title Pt will perform rolling R and L at min A level in order to indicate improved functional independence.  (Target Date: 11/19/16)   Time 10   Period Weeks   Status New     PT LONG TERM GOAL #2   Title Pt will perform SL<>sit at min A level in order to indicate improved functional independence.     Time 10   Period Weeks   Status New     PT LONG TERM GOAL #3   Title Pt will tolerate sitting at EOB x 10 mins without UE support reaching outside of BOS at mod I level in order to indicate improved independence with ADLs.     Time 10   Period Weeks   Status New     PT LONG TERM GOAL #4   Title Pt will perform slideboard transfers at min A level in order to indicate improved functional indpendence and decreased burden of care.    Time 10   Period Weeks   Status New     PT LONG TERM GOAL #5   Title Pt will self propel her w/c (once decided manual vs power) at mod I level x 25' in order to indicate improved independence in home.     Time 10   Period Weeks   Status New     Additional Long Term Goals   Additional Long Term Goals Yes     PT LONG TERM GOAL #6   Title Will assess standing as able and appropriate to indicate improved functional independence.    Time 10   Period Weeks   Status New        Remaining deficits: Unsure as pt did not want to return due to GI issues.    Education / Equipment: HEP  Plan: Patient agrees to discharge.  Patient goals were not  met. Patient is being discharged due to not returning since the last visit.  ?????     Cameron Sprang, PT, MPT [facility] [address] [place], [place], [address] Phone: [phone]   Fax:  [phone] 12/24/16, 3:01 PM

## 2017-01-01 ENCOUNTER — Encounter: Payer: Self-pay | Admitting: Physical Medicine & Rehabilitation

## 2017-01-01 ENCOUNTER — Telehealth: Payer: Self-pay | Admitting: *Deleted

## 2017-01-01 MED ORDER — CITALOPRAM HYDROBROMIDE 20 MG PO TABS: 20.0000 mg | ORAL_TABLET | Freq: Every day | ORAL | 2 refills | Status: DC

## 2017-01-01 MED ORDER — TAPENTADOL HCL 75 MG PO TABS: 75.0000 mg | ORAL_TABLET | Freq: Four times a day (QID) | ORAL | 0 refills | Status: DC | PRN

## 2017-01-01 NOTE — Telephone Encounter (Signed)
[facility] Dr [facility] [place]: Called husband and discussed. Likely due to cymbalta---we are stopping this today. I have sent in an rx for celexa to pharmacy which she'll start tonight. Also advised to decreased nucynta to75mg  (they have supply still at home).  I have changed the nucynta in the medictions to the new dose for future ordering

## 2017-01-01 NOTE — Telephone Encounter (Signed)
Called husband and discussed. Likely due to cymbalta---we are stopping this today. I have sent in an rx for celexa to pharmacy which she'll start tonight. Also advised to decreased nucynta to75mg  (they have supply still at home).

## 2017-01-03 ENCOUNTER — Other Ambulatory Visit: Payer: Self-pay | Admitting: Physical Medicine & Rehabilitation

## 2017-01-04 ENCOUNTER — Other Ambulatory Visit: Payer: Self-pay | Admitting: Physical Medicine & Rehabilitation

## 2017-01-04 ENCOUNTER — Encounter: Payer: Self-pay | Admitting: Physical Medicine & Rehabilitation

## 2017-01-12 ENCOUNTER — Other Ambulatory Visit: Payer: Self-pay | Admitting: Physical Medicine & Rehabilitation

## 2017-01-12 DIAGNOSIS — M5416 Radiculopathy, lumbar region: Secondary | ICD-10-CM

## 2017-01-12 DIAGNOSIS — M4722 Other spondylosis with radiculopathy, cervical region: Secondary | ICD-10-CM

## 2017-01-12 DIAGNOSIS — G8250 Quadriplegia, unspecified: Secondary | ICD-10-CM

## 2017-01-12 DIAGNOSIS — S14105S Unspecified injury at C5 level of cervical spinal cord, sequela: Secondary | ICD-10-CM

## 2017-01-12 DIAGNOSIS — Z5181 Encounter for therapeutic drug level monitoring: Secondary | ICD-10-CM

## 2017-01-12 DIAGNOSIS — M4802 Spinal stenosis, cervical region: Secondary | ICD-10-CM

## 2017-01-14 ENCOUNTER — Telehealth: Payer: Self-pay | Admitting: Physical Medicine & Rehabilitation

## 2017-01-14 ENCOUNTER — Other Ambulatory Visit: Payer: Self-pay

## 2017-01-14 DIAGNOSIS — M5416 Radiculopathy, lumbar region: Secondary | ICD-10-CM

## 2017-01-14 DIAGNOSIS — G8254 Quadriplegia, C5-C7 incomplete: Secondary | ICD-10-CM

## 2017-01-14 DIAGNOSIS — S14105S Unspecified injury at C5 level of cervical spinal cord, sequela: Secondary | ICD-10-CM

## 2017-01-14 MED ORDER — SULINDAC 150 MG PO TABS: 150.0000 mg | ORAL_TABLET | Freq: Two times a day (BID) | ORAL | 1 refills | Status: DC

## 2017-01-14 NOTE — Telephone Encounter (Signed)
Patient returned your call.

## 2017-01-14 NOTE — Telephone Encounter (Signed)
Checked office, no one here called her today.

## 2017-01-15 ENCOUNTER — Telehealth: Payer: Self-pay | Admitting: *Deleted

## 2017-01-15 ENCOUNTER — Telehealth: Payer: Self-pay | Admitting: Physical Medicine & Rehabilitation

## 2017-01-15 NOTE — Telephone Encounter (Signed)
Connie OT wiJunious Dresserth [facility] needs to get verbal orders for recertification 1w8 for patient.  Please call her at [phone].

## 2017-01-15 NOTE — Telephone Encounter (Signed)
Duplicate. This has been done.  

## 2017-01-15 NOTE — Telephone Encounter (Signed)
Jasmine [name] OT calling to recert OT orders 1wk8.  Approval given

## 2017-01-18 ENCOUNTER — Telehealth: Payer: Self-pay | Admitting: Physical Medicine & Rehabilitation

## 2017-01-18 NOTE — Telephone Encounter (Signed)
Erin notified approval given.

## 2017-01-18 NOTE — Telephone Encounter (Signed)
Erin PT with [facility] needs to get verbal orders for continuing care 1w8 for transfer training, strengthening, and range of motion.  Please call her at [phone].

## 2017-01-19 ENCOUNTER — Encounter: Payer: BLUE CROSS/BLUE SHIELD | Attending: Physical Medicine & Rehabilitation | Admitting: Registered Nurse

## 2017-01-19 ENCOUNTER — Encounter: Payer: Self-pay | Admitting: Registered Nurse

## 2017-01-19 VITALS — BP 120/85 | HR 92

## 2017-01-19 DIAGNOSIS — M5416 Radiculopathy, lumbar region: Secondary | ICD-10-CM | POA: Diagnosis not present

## 2017-01-19 DIAGNOSIS — R51 Headache: Secondary | ICD-10-CM | POA: Insufficient documentation

## 2017-01-19 DIAGNOSIS — Z5181 Encounter for therapeutic drug level monitoring: Secondary | ICD-10-CM

## 2017-01-19 DIAGNOSIS — K219 Gastro-esophageal reflux disease without esophagitis: Secondary | ICD-10-CM | POA: Insufficient documentation

## 2017-01-19 DIAGNOSIS — G8254 Quadriplegia, C5-C7 incomplete: Secondary | ICD-10-CM | POA: Insufficient documentation

## 2017-01-19 DIAGNOSIS — N3289 Other specified disorders of bladder: Secondary | ICD-10-CM | POA: Diagnosis not present

## 2017-01-19 DIAGNOSIS — R200 Anesthesia of skin: Secondary | ICD-10-CM | POA: Insufficient documentation

## 2017-01-19 DIAGNOSIS — R252 Cramp and spasm: Secondary | ICD-10-CM | POA: Diagnosis not present

## 2017-01-19 DIAGNOSIS — F4323 Adjustment disorder with mixed anxiety and depressed mood: Secondary | ICD-10-CM | POA: Diagnosis not present

## 2017-01-19 DIAGNOSIS — Z79899 Other long term (current) drug therapy: Secondary | ICD-10-CM

## 2017-01-19 DIAGNOSIS — R531 Weakness: Secondary | ICD-10-CM | POA: Insufficient documentation

## 2017-01-19 DIAGNOSIS — R011 Cardiac murmur, unspecified: Secondary | ICD-10-CM | POA: Diagnosis not present

## 2017-01-19 DIAGNOSIS — R52 Pain, unspecified: Secondary | ICD-10-CM | POA: Diagnosis present

## 2017-01-19 DIAGNOSIS — S14105S Unspecified injury at C5 level of cervical spinal cord, sequela: Secondary | ICD-10-CM

## 2017-01-19 DIAGNOSIS — G8250 Quadriplegia, unspecified: Secondary | ICD-10-CM

## 2017-01-19 DIAGNOSIS — K592 Neurogenic bowel, not elsewhere classified: Secondary | ICD-10-CM

## 2017-01-19 DIAGNOSIS — N319 Neuromuscular dysfunction of bladder, unspecified: Secondary | ICD-10-CM

## 2017-01-19 DIAGNOSIS — S14109A Unspecified injury at unspecified level of cervical spinal cord, initial encounter: Secondary | ICD-10-CM | POA: Insufficient documentation

## 2017-01-19 DIAGNOSIS — G4700 Insomnia, unspecified: Secondary | ICD-10-CM | POA: Diagnosis not present

## 2017-01-19 MED ORDER — TAPENTADOL HCL 100 MG PO TABS: 100.0000 mg | ORAL_TABLET | Freq: Four times a day (QID) | ORAL | 0 refills | Status: DC
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
## 2017-01-20 ENCOUNTER — Telehealth: Payer: Self-pay | Admitting: Registered Nurse

## 2017-01-20 NOTE — Telephone Encounter (Signed)
On 01/20/2017 the  NCCSR was reviewed no conflict was seen on the [facility] [place] Cont[place] Substance Reporting System with multiple prescribers. [name] has a signed narcotic contract with our office. If there were any discrepancies this would have been reported to her physician.

## 2017-01-20 NOTE — Telephone Encounter (Signed)
[name] was seen in the office on 01/19/2017, she and her husband stated the Clinoril is not working. I let her know I would speak with Dr. Riley KillSwartz, she verbalizes understanding. I spoke with Dr. Riley KillSwartz today he recommended to discontinue the Clinoril. Medication history was reviewed she has been prescribed Mobic and  Voltaren they were ineffective as well. She verbalizes understanding.   Also instructed to call office in two weeks to evaluate Nucynta, she verbalizes understanding.

## 2017-01-25 ENCOUNTER — Encounter: Payer: Self-pay | Admitting: Psychology

## 2017-01-25 ENCOUNTER — Encounter (HOSPITAL_BASED_OUTPATIENT_CLINIC_OR_DEPARTMENT_OTHER): Payer: BLUE CROSS/BLUE SHIELD | Admitting: Psychology

## 2017-01-25 DIAGNOSIS — N319 Neuromuscular dysfunction of bladder, unspecified: Secondary | ICD-10-CM

## 2017-01-25 DIAGNOSIS — F4323 Adjustment disorder with mixed anxiety and depressed mood: Secondary | ICD-10-CM | POA: Diagnosis not present

## 2017-01-25 DIAGNOSIS — S14105S Unspecified injury at C5 level of cervical spinal cord, sequela: Secondary | ICD-10-CM | POA: Diagnosis not present

## 2017-01-25 DIAGNOSIS — F4321 Adjustment disorder with depressed mood: Secondary | ICD-10-CM

## 2017-01-25 DIAGNOSIS — G8254 Quadriplegia, C5-C7 incomplete: Secondary | ICD-10-CM

## 2017-01-25 NOTE — Progress Notes (Signed)
Patient:  Jasmine [name]   DOB: 02-26-1976  MR Number: [id number]  Location: [facility] [address], [place] [id number] [place] [address] Dept: [phone]  Start: 3 PM 4 PM End: 4 PM  Provider/Observer:     Hershal Coria PSYD  Chief Complaint:      Chief Complaint  Patient presents with  . Anxiety  . Depression  . Stress  . Pain  . Other    Paralysis of legs and left arm    Reason For Service:     The patient was referred by Dr. Riley Kill for psychological/neuropsychological consultation interventions. The patient has been dealing with significant depression following spinal cord injury and loss of functioning. She now is coping with incomplete quadriplegia. The patient describes severe depression, anxiety, mood changes and reports that she spends a lot of time crying and tearing for an unknown outcome. She reports that this is been going on for the past 6 months.   Interventions Strategy:  Cognitive/behavioral psychotherapeutic interventions as well as working on Producer, television/film/video and strategies to better adaptive and adjust to paralysis.  Participation Level:   Active  Participation Quality:  Appropriate and Attentive      Behavioral Observation:  Well Groomed, Alert, and Appropriate.   Current Psychosocial Factors: The patient reports that she has been very upset about a recent reduction in functioning. She reports that she had bowel issues and was not able to engage in her physical therapies that she had been doing. She also reports that she developed significant pain in her coccyx bone potentially due to riding or working on recombinant bicycle. She reports that she had been able to stand over 40 minutes and walk in the water until recently and now she is not able to do such. She reports that this is had a significant deleterious effect on her mood states.  Her husband continues to be quite anxious and worried about her not doing enough to recover.  Content of Session:   Reviewed current symptoms and worked on continuing to develop further therapeutic interventions.  Current Status:   The patient reports that she has been more depressed recently with a recent setback in her functioning.  Patient Progress:   The patient reports that she did work on some of the issues that we began developing but she has had a great deal of difficulty recently coping with a recent reduction in physical functioning that she had made gains in through physical therapy.  Target Goals:   Improve coping skills and strategies around adjusting to coping with her paralysis.   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 She continues to have extensive and significant pain symptoms, incomplete quadriplegia, neurogenic bowel and bladder, and chronic pain symptoms. She has developed significant depression and anxiety following this significant change and disruption to her life functioning.  Diagnosis:   Spinal cord injury at C5-C7 level without injury of spinal bone, sequela (HCC)  C5-C7 incomplete quadriplegia (HCC)  Adjustment disorder with mixed anxiety and depressed mood  Neurogenic bladder  Adjustment disorder with depressed mood

## 2017-01-29 ENCOUNTER — Other Ambulatory Visit: Payer: Self-pay | Admitting: Physical Medicine & Rehabilitation

## 2017-02-01 ENCOUNTER — Encounter (HOSPITAL_BASED_OUTPATIENT_CLINIC_OR_DEPARTMENT_OTHER): Payer: BLUE CROSS/BLUE SHIELD | Admitting: Psychology

## 2017-02-01 DIAGNOSIS — S14105S Unspecified injury at C5 level of cervical spinal cord, sequela: Secondary | ICD-10-CM | POA: Diagnosis not present

## 2017-02-01 DIAGNOSIS — F4321 Adjustment disorder with depressed mood: Secondary | ICD-10-CM | POA: Diagnosis not present

## 2017-02-01 DIAGNOSIS — G8254 Quadriplegia, C5-C7 incomplete: Secondary | ICD-10-CM

## 2017-02-01 DIAGNOSIS — F4323 Adjustment disorder with mixed anxiety and depressed mood: Secondary | ICD-10-CM

## 2017-02-09 ENCOUNTER — Other Ambulatory Visit: Payer: Self-pay | Admitting: Registered Nurse

## 2017-02-09 MED ORDER — TAPENTADOL HCL 100 MG PO TABS: 100.0000 mg | ORAL_TABLET | Freq: Four times a day (QID) | ORAL | 0 refills | Status: DC

## 2017-02-09 NOTE — Telephone Encounter (Signed)
[name] is requesting refill on her Nucynta. Rx printed for [name] to sign.  She did not receive Rx on 01/19/17 as Epic shows (per MadroneEunice) [place] notified.

## 2017-02-09 NOTE — Progress Notes (Signed)
Patient:  Jasmine EddyBrenda [name]   DOB: 03/26/1976  MR Number: [id number]  [facility] [address], [place] [address] [id number] [place] [id number] Dept: [phone]  Start: 3 PM End: 4 PM  Provider/Observer:     Hershal CoriaJohn R Rodenbough PSYD  Chief Complaint:      Chief Complaint  Patient presents with  . Anxiety  . Depression  . Stress  . Pain    Reason For Service:     The patient was referred by Dr. Riley KillSwartz for psychological/neuropsychological consultation interventions. The patient has been dealing with significant depression following spinal cord injury and loss of functioning. She now is coping with incomplete quadriplegia. The patient describes severe depression, anxiety, mood changes and reports that she spends a lot of time crying and tearing for an unknown outcome. She reports that this is been going on for the past 6 months.   Interventions Strategy:  Cognitive/behavioral psychotherapeutic interventions as well as working on Producer, television/film/videobuilding coping skills and strategies to better adaptive and adjust to paralysis.  Participation Level:   Active  Participation Quality:  Appropriate and Attentive      Behavioral Observation:  Well Groomed, Alert, and Appropriate.   Current Psychosocial Factors: The patient reports that she has been very upset about a recent reduction in functioning. She reports that she had bowel issues and was not able to engage in her physical therapies that she had been doing. She also reports that she developed significant pain in her coccyx bone potentially due to riding or working on recombinant bicycle. She reports that she had been able to stand over 40 minutes and walk in the water until recently and now she is not able to do such. She reports that this is had a significant deleterious effect on her mood states. Her husband continues to be quite anxious and  worried about her not doing enough to recover.  Content of Session:   Reviewed current symptoms and worked on continuing to develop further therapeutic interventions.  Current Status:   The patient reports that she has been more depressed recently with a recent setback in her functioning.  Patient Progress:   The patient reports that she did work on some of the issues that we began developing but she has had a great deal of difficulty recently coping with a recent reduction in physical functioning that she had made gains in through physical therapy.  Target Goals:   Improve coping skills and strategies around adjusting to coping with her paralysis.   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 She continues to have extensive and significant pain symptoms, incomplete quadriplegia, neurogenic bowel and bladder, and chronic pain symptoms. She has developed significant depression and anxiety following this significant change and disruption to her life functioning.  Diagnosis:   Spinal cord injury at C5-C7 level without injury of spinal bone, sequela (HCC)  Adjustment disorder with mixed anxiety and depressed mood  C5-C7 incomplete quadriplegia (HCC)

## 2017-02-11 ENCOUNTER — Encounter: Payer: BLUE CROSS/BLUE SHIELD | Admitting: Psychology

## 2017-02-14 ENCOUNTER — Other Ambulatory Visit: Payer: Self-pay | Admitting: Physical Medicine & Rehabilitation

## 2017-02-14 DIAGNOSIS — G8250 Quadriplegia, unspecified: Secondary | ICD-10-CM

## 2017-02-14 DIAGNOSIS — S14105S Unspecified injury at C5 level of cervical spinal cord, sequela: Secondary | ICD-10-CM

## 2017-02-22 ENCOUNTER — Encounter: Payer: Self-pay | Admitting: Physical Medicine & Rehabilitation

## 2017-02-22 ENCOUNTER — Encounter
Payer: BLUE CROSS/BLUE SHIELD | Attending: Physical Medicine & Rehabilitation | Admitting: Physical Medicine & Rehabilitation

## 2017-02-22 VITALS — BP 103/70 | HR 91 | Resp 14

## 2017-02-22 DIAGNOSIS — R52 Pain, unspecified: Secondary | ICD-10-CM | POA: Diagnosis present

## 2017-02-22 DIAGNOSIS — R51 Headache: Secondary | ICD-10-CM | POA: Diagnosis not present

## 2017-02-22 DIAGNOSIS — R011 Cardiac murmur, unspecified: Secondary | ICD-10-CM | POA: Diagnosis not present

## 2017-02-22 DIAGNOSIS — M5416 Radiculopathy, lumbar region: Secondary | ICD-10-CM | POA: Diagnosis not present

## 2017-02-22 DIAGNOSIS — G8250 Quadriplegia, unspecified: Secondary | ICD-10-CM

## 2017-02-22 DIAGNOSIS — S14105S Unspecified injury at C5 level of cervical spinal cord, sequela: Secondary | ICD-10-CM

## 2017-02-22 DIAGNOSIS — R200 Anesthesia of skin: Secondary | ICD-10-CM | POA: Insufficient documentation

## 2017-02-22 DIAGNOSIS — G4700 Insomnia, unspecified: Secondary | ICD-10-CM | POA: Diagnosis not present

## 2017-02-22 DIAGNOSIS — R252 Cramp and spasm: Secondary | ICD-10-CM | POA: Diagnosis not present

## 2017-02-22 DIAGNOSIS — R531 Weakness: Secondary | ICD-10-CM | POA: Diagnosis not present

## 2017-02-22 DIAGNOSIS — M4722 Other spondylosis with radiculopathy, cervical region: Secondary | ICD-10-CM | POA: Diagnosis not present

## 2017-02-22 DIAGNOSIS — K219 Gastro-esophageal reflux disease without esophagitis: Secondary | ICD-10-CM | POA: Insufficient documentation

## 2017-02-22 DIAGNOSIS — S14109A Unspecified injury at unspecified level of cervical spinal cord, initial encounter: Secondary | ICD-10-CM | POA: Diagnosis not present

## 2017-02-22 DIAGNOSIS — G8254 Quadriplegia, C5-C7 incomplete: Secondary | ICD-10-CM | POA: Diagnosis not present

## 2017-02-22 DIAGNOSIS — N3289 Other specified disorders of bladder: Secondary | ICD-10-CM | POA: Diagnosis not present

## 2017-02-22 MED ORDER — DANTROLENE SODIUM 100 MG PO CAPS: 100.0000 mg | ORAL_CAPSULE | Freq: Two times a day (BID) | ORAL | 4 refills | Status: DC

## 2017-02-22 MED ORDER — MORPHINE SULFATE 15 MG PO TABS: 15.0000 mg | ORAL_TABLET | Freq: Four times a day (QID) | ORAL | 0 refills | Status: DC | PRN

## 2017-02-22 NOTE — Patient Instructions (Signed)
PLEASE FEEL FREE TO CALL OUR OFFICE WITH ANY PROBLEMS OR QUESTIONS ([phone])      
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
## 2017-02-24 ENCOUNTER — Ambulatory Visit: Payer: BLUE CROSS/BLUE SHIELD | Admitting: Physical Medicine & Rehabilitation

## 2017-03-04 ENCOUNTER — Encounter (HOSPITAL_BASED_OUTPATIENT_CLINIC_OR_DEPARTMENT_OTHER): Payer: BLUE CROSS/BLUE SHIELD | Admitting: Psychology

## 2017-03-04 DIAGNOSIS — F4323 Adjustment disorder with mixed anxiety and depressed mood: Secondary | ICD-10-CM | POA: Diagnosis not present

## 2017-03-04 DIAGNOSIS — M5416 Radiculopathy, lumbar region: Secondary | ICD-10-CM

## 2017-03-04 DIAGNOSIS — G8254 Quadriplegia, C5-C7 incomplete: Secondary | ICD-10-CM | POA: Diagnosis not present

## 2017-03-04 DIAGNOSIS — S14105S Unspecified injury at C5 level of cervical spinal cord, sequela: Secondary | ICD-10-CM

## 2017-03-04 DIAGNOSIS — F4321 Adjustment disorder with depressed mood: Secondary | ICD-10-CM | POA: Diagnosis not present

## 2017-03-04 NOTE — Progress Notes (Signed)
Patient:  Jasmine [name]   DOB: 1975/09/12  MR Number: [id number]  Location: [facility] FOR PAIN AND [facility] MEDICINE [facility] PHYSICAL MEDICINE AND REHABILITATION [address], [place] [address] [id number] [place] [address] Dept: [phone]  Start: 3 PM End: 4 PM  Provider/Observer:     Hershal Coria PSYD  Chief Complaint:      Chief Complaint  Patient presents with  . Pain  . Sleeping Problem  . Depression  . Anxiety  . Agitation  . Stress    Reason For Service:     The patient was referred by Dr. Riley Kill for psychological/neuropsychological consultation interventions. The patient has been dealing with significant depression following spinal cord injury and loss of functioning. She now is coping with incomplete quadriplegia. The patient describes severe depression, anxiety, mood changes and reports that she spends a lot of time crying and tearing for an unknown outcome. She reports that this is been going on for the past 6 months.   Interventions Strategy:  Cognitive/behavioral psychotherapeutic interventions as well as working on Producer, television/film/video and strategies to better adaptive and adjust to paralysis.  Participation Level:   Active  Participation Quality:  Appropriate and Attentive      Behavioral Observation:  Well Groomed, Alert, and Appropriate.   Current Psychosocial Factors: The patient reports that She has gone to her first group meeting for spinal cord injury patients. The patient reports that this was a very good experience for her overall. She reports that it really helped to see people been going through the same thing that she has been going. The patient reports that she does plan on continuing.  Content of Session:   Reviewed current symptoms and worked on continuing to develop further therapeutic interventions.  Current Status:   The patient reports that she has has done much better over the past month or so since I saw  her last. She reports that she has been more active. One of the major benefits was that her gastrointestinal functioning improved which is allow for significant improvement in her activities including going to aqua therapy.  Patient Progress:   The patient reports that she did work on some of the issues that we began developing but she has had a great deal of difficulty recently coping with a recent reduction in physical functioning that she had made gains in through physical therapy.  Target Goals:   Improve coping skills and strategies around adjusting to coping with her paralysis.   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 She continues to have extensive and significant pain symptoms, incomplete quadriplegia, neurogenic bowel and bladder, and chronic pain symptoms. She has developed significant depression and anxiety following this significant change and disruption to her life functioning.  Diagnosis:   Spinal cord injury at C5-C7 level without injury of spinal bone, sequela (HCC)  Chronic lumbar radiculopathy  Adjustment disorder with mixed anxiety and depressed mood  C5-C7 incomplete quadriplegia (HCC)  Adjustment disorder with depressed mood

## 2017-03-18 ENCOUNTER — Ambulatory Visit: Payer: BLUE CROSS/BLUE SHIELD | Admitting: Psychology

## 2017-03-22 ENCOUNTER — Other Ambulatory Visit: Payer: Self-pay | Admitting: Physical Medicine & Rehabilitation

## 2017-03-22 DIAGNOSIS — W11XXXS Fall on and from ladder, sequela: Secondary | ICD-10-CM | POA: Diagnosis not present

## 2017-03-22 DIAGNOSIS — Z7901 Long term (current) use of anticoagulants: Secondary | ICD-10-CM | POA: Diagnosis not present

## 2017-03-22 DIAGNOSIS — S14155S Other incomplete lesion at C5 level of cervical spinal cord, sequela: Secondary | ICD-10-CM | POA: Diagnosis not present

## 2017-03-22 DIAGNOSIS — G8250 Quadriplegia, unspecified: Secondary | ICD-10-CM | POA: Diagnosis not present

## 2017-03-22 DIAGNOSIS — F4323 Adjustment disorder with mixed anxiety and depressed mood: Secondary | ICD-10-CM | POA: Diagnosis not present

## 2017-03-22 DIAGNOSIS — N319 Neuromuscular dysfunction of bladder, unspecified: Secondary | ICD-10-CM | POA: Diagnosis not present

## 2017-03-22 DIAGNOSIS — M150 Primary generalized (osteo)arthritis: Secondary | ICD-10-CM | POA: Diagnosis not present

## 2017-03-22 DIAGNOSIS — Z9181 History of falling: Secondary | ICD-10-CM | POA: Diagnosis not present

## 2017-03-22 DIAGNOSIS — K592 Neurogenic bowel, not elsewhere classified: Secondary | ICD-10-CM | POA: Diagnosis not present

## 2017-03-22 DIAGNOSIS — Z981 Arthrodesis status: Secondary | ICD-10-CM | POA: Diagnosis not present

## 2017-03-24 ENCOUNTER — Encounter: Payer: Self-pay | Admitting: Physical Medicine & Rehabilitation

## 2017-03-24 ENCOUNTER — Encounter: Payer: 59 | Attending: Physical Medicine & Rehabilitation | Admitting: Physical Medicine & Rehabilitation

## 2017-03-24 VITALS — BP 110/74 | HR 86

## 2017-03-24 DIAGNOSIS — S14105S Unspecified injury at C5 level of cervical spinal cord, sequela: Secondary | ICD-10-CM

## 2017-03-24 DIAGNOSIS — M4722 Other spondylosis with radiculopathy, cervical region: Secondary | ICD-10-CM

## 2017-03-24 DIAGNOSIS — S14109A Unspecified injury at unspecified level of cervical spinal cord, initial encounter: Secondary | ICD-10-CM | POA: Insufficient documentation

## 2017-03-24 DIAGNOSIS — M5416 Radiculopathy, lumbar region: Secondary | ICD-10-CM

## 2017-03-24 DIAGNOSIS — G8250 Quadriplegia, unspecified: Secondary | ICD-10-CM

## 2017-03-24 DIAGNOSIS — N3289 Other specified disorders of bladder: Secondary | ICD-10-CM | POA: Insufficient documentation

## 2017-03-24 DIAGNOSIS — K219 Gastro-esophageal reflux disease without esophagitis: Secondary | ICD-10-CM | POA: Diagnosis not present

## 2017-03-24 DIAGNOSIS — R51 Headache: Secondary | ICD-10-CM | POA: Diagnosis not present

## 2017-03-24 DIAGNOSIS — G4700 Insomnia, unspecified: Secondary | ICD-10-CM | POA: Insufficient documentation

## 2017-03-24 DIAGNOSIS — R011 Cardiac murmur, unspecified: Secondary | ICD-10-CM | POA: Diagnosis not present

## 2017-03-24 DIAGNOSIS — R252 Cramp and spasm: Secondary | ICD-10-CM | POA: Diagnosis not present

## 2017-03-24 DIAGNOSIS — G8254 Quadriplegia, C5-C7 incomplete: Secondary | ICD-10-CM | POA: Insufficient documentation

## 2017-03-24 DIAGNOSIS — R52 Pain, unspecified: Secondary | ICD-10-CM | POA: Diagnosis present

## 2017-03-24 DIAGNOSIS — R200 Anesthesia of skin: Secondary | ICD-10-CM | POA: Diagnosis not present

## 2017-03-24 DIAGNOSIS — R531 Weakness: Secondary | ICD-10-CM | POA: Insufficient documentation

## 2017-03-24 MED ORDER — MORPHINE SULFATE 15 MG PO TABS: 15.0000 mg | ORAL_TABLET | Freq: Four times a day (QID) | ORAL | 0 refills | Status: DC | PRN

## 2017-03-24 NOTE — Progress Notes (Signed)
Botox Injection for spasticity using needle EMG guidance Indication: Spastic tetraplegia (HCC) - Plan: morphine (MSIR) 15 MG tablet, DISCONTINUED: morphine (MSIR) 15 MG tablet  Spinal cord injury at C5-C7 level without injury of spinal bone, sequela (HCC) - Plan: morphine (MSIR) 15 MG tablet, DISCONTINUED: morphine (MSIR) 15 MG tablet  Chronic lumbar radiculopathy - Plan: morphine (MSIR) 15 MG tablet, DISCONTINUED: morphine (MSIR) 15 MG tablet  Cervical radiculopathy due to degenerative joint disease of spine - Plan: morphine (MSIR) 15 MG tablet, DISCONTINUED: morphine (MSIR) 15 MG tablet  LUE and LLE Dilution: 100 Units/ml        Total Units Injected: 500 Indication: Severe spasticity which interferes with ADL,mobility and/or  hygiene and is unresponsive to medication management and other conservative care Informed consent was obtained after describing risks and benefits of the procedure with the patient. This includes bleeding, bruising, infection, excessive weakness, or medication side effects. A REMS form is on file and signed.  Needle: 50mm injectable monopolar needle electrode  Number of units per muscle Pectoralis Major 0 units Pectoralis Minor 0 units Biceps 0 units Brachioradialis 0 units FCR 25 units FCU 25 units FDS 100 units FDP 100 units FPL 0 units Pronator Teres 0 units Pronator Quadratus 0 units Lumbricals 4, 50 units Quadriceps 0 units Gastroc/soleus 0 units Hamstrings 200 units, 4 access points Tibialis Posterior 0 units EHL 0 units All injections were done after obtaining appropriate EMG activity and after negative drawback for blood. The patient tolerated the procedure well. Post procedure instructions were given. Return in about 2 months (around 05/24/2017).   Consider outpt PT referral over the next few months. May be an orthotic candidate

## 2017-03-24 NOTE — Patient Instructions (Addendum)
PLEASE FEEL FREE TO CALL OUR OFFICE WITH ANY PROBLEMS OR QUESTIONS [phone])    DECREASE DANTRIUM TO 100MG  ONCE DAILY THEN OFF

## 2017-03-25 ENCOUNTER — Telehealth: Payer: Self-pay | Admitting: Physical Medicine & Rehabilitation

## 2017-03-25 DIAGNOSIS — S14105S Unspecified injury at C5 level of cervical spinal cord, sequela: Secondary | ICD-10-CM

## 2017-03-25 DIAGNOSIS — M4722 Other spondylosis with radiculopathy, cervical region: Secondary | ICD-10-CM

## 2017-03-25 DIAGNOSIS — M5416 Radiculopathy, lumbar region: Secondary | ICD-10-CM

## 2017-03-25 DIAGNOSIS — G8250 Quadriplegia, unspecified: Secondary | ICD-10-CM

## 2017-03-25 NOTE — Telephone Encounter (Signed)
Recieved patient generated electronic medication refill request asking that we perform a prior authorization for patients Morphine 15mg  (MSIR),  On patients last note, yesterday, it was mentioned 4 times that the patients morphine was discontinued, also On the patients medication list, a refill for the morphine was generated and handed to the patient as well on 03/24/17  I am not sure which direction to go with based on avaliable information, is this patient still taking morphine or was it discontinued?  Please advise

## 2017-03-26 ENCOUNTER — Telehealth: Payer: Self-pay

## 2017-03-26 NOTE — Telephone Encounter (Signed)
Patients husband emailed and called stating that the patient was out of morphine and that they have recently changed insurance [place] [employer] and needed a prior authorization for the new prescriptions  Prior Auth submitted on 03-25-17  Prior Auth has returned with approved status, patients pharmacy called and left voicemail to continue to fill the medication for the patient.  Called patients husbands and left voicemail informing them of approval

## 2017-04-02 ENCOUNTER — Encounter: Payer: Self-pay | Admitting: Physical Medicine & Rehabilitation

## 2017-04-04 ENCOUNTER — Other Ambulatory Visit: Payer: Self-pay | Admitting: Physical Medicine & Rehabilitation

## 2017-04-05 ENCOUNTER — Encounter: Payer: Self-pay | Admitting: Physical Medicine & Rehabilitation

## 2017-04-06 NOTE — Telephone Encounter (Signed)
Morphine Sulfate 15 mg IR approved by [employer] 03/25/2017- 03/25/2018.

## 2017-04-11 ENCOUNTER — Other Ambulatory Visit: Payer: Self-pay | Admitting: Physical Medicine & Rehabilitation

## 2017-04-11 DIAGNOSIS — S14105S Unspecified injury at C5 level of cervical spinal cord, sequela: Secondary | ICD-10-CM

## 2017-04-11 DIAGNOSIS — G8250 Quadriplegia, unspecified: Secondary | ICD-10-CM

## 2017-04-11 DIAGNOSIS — M4722 Other spondylosis with radiculopathy, cervical region: Secondary | ICD-10-CM

## 2017-04-14 ENCOUNTER — Encounter: Payer: Self-pay | Admitting: Physical Medicine & Rehabilitation

## 2017-04-21 DIAGNOSIS — N319 Neuromuscular dysfunction of bladder, unspecified: Secondary | ICD-10-CM | POA: Diagnosis not present

## 2017-04-21 DIAGNOSIS — W11XXXS Fall on and from ladder, sequela: Secondary | ICD-10-CM | POA: Diagnosis not present

## 2017-04-21 DIAGNOSIS — M150 Primary generalized (osteo)arthritis: Secondary | ICD-10-CM | POA: Diagnosis not present

## 2017-04-21 DIAGNOSIS — G8250 Quadriplegia, unspecified: Secondary | ICD-10-CM | POA: Diagnosis not present

## 2017-04-21 DIAGNOSIS — Z9181 History of falling: Secondary | ICD-10-CM | POA: Diagnosis not present

## 2017-04-21 DIAGNOSIS — K592 Neurogenic bowel, not elsewhere classified: Secondary | ICD-10-CM | POA: Diagnosis not present

## 2017-04-21 DIAGNOSIS — Z7901 Long term (current) use of anticoagulants: Secondary | ICD-10-CM | POA: Diagnosis not present

## 2017-04-21 DIAGNOSIS — M5116 Intervertebral disc disorders with radiculopathy, lumbar region: Secondary | ICD-10-CM | POA: Diagnosis not present

## 2017-04-21 DIAGNOSIS — Z981 Arthrodesis status: Secondary | ICD-10-CM | POA: Diagnosis not present

## 2017-04-21 DIAGNOSIS — S14155S Other incomplete lesion at C5 level of cervical spinal cord, sequela: Secondary | ICD-10-CM | POA: Diagnosis not present

## 2017-04-21 DIAGNOSIS — F4323 Adjustment disorder with mixed anxiety and depressed mood: Secondary | ICD-10-CM | POA: Diagnosis not present

## 2017-05-05 ENCOUNTER — Other Ambulatory Visit: Payer: Self-pay | Admitting: Physical Medicine & Rehabilitation

## 2017-05-05 DIAGNOSIS — S14105S Unspecified injury at C5 level of cervical spinal cord, sequela: Secondary | ICD-10-CM

## 2017-05-05 DIAGNOSIS — G8250 Quadriplegia, unspecified: Secondary | ICD-10-CM

## 2017-05-18 ENCOUNTER — Other Ambulatory Visit: Payer: Self-pay | Admitting: Physical Medicine & Rehabilitation

## 2017-05-23 ENCOUNTER — Other Ambulatory Visit: Payer: Self-pay | Admitting: Physical Medicine & Rehabilitation

## 2017-05-23 DIAGNOSIS — M4802 Spinal stenosis, cervical region: Secondary | ICD-10-CM

## 2017-05-23 DIAGNOSIS — S14105S Unspecified injury at C5 level of cervical spinal cord, sequela: Secondary | ICD-10-CM

## 2017-05-23 DIAGNOSIS — G8250 Quadriplegia, unspecified: Secondary | ICD-10-CM

## 2017-05-23 DIAGNOSIS — Z5181 Encounter for therapeutic drug level monitoring: Secondary | ICD-10-CM

## 2017-05-23 DIAGNOSIS — M5416 Radiculopathy, lumbar region: Secondary | ICD-10-CM

## 2017-05-23 DIAGNOSIS — M4722 Other spondylosis with radiculopathy, cervical region: Secondary | ICD-10-CM

## 2017-05-24 ENCOUNTER — Encounter: Payer: 59 | Attending: Physical Medicine & Rehabilitation | Admitting: Physical Medicine & Rehabilitation

## 2017-05-24 ENCOUNTER — Encounter: Payer: Self-pay | Admitting: Physical Medicine & Rehabilitation

## 2017-05-24 VITALS — BP 120/82 | HR 90

## 2017-05-24 DIAGNOSIS — N319 Neuromuscular dysfunction of bladder, unspecified: Secondary | ICD-10-CM | POA: Diagnosis not present

## 2017-05-24 DIAGNOSIS — K592 Neurogenic bowel, not elsewhere classified: Secondary | ICD-10-CM | POA: Diagnosis not present

## 2017-05-24 DIAGNOSIS — R011 Cardiac murmur, unspecified: Secondary | ICD-10-CM | POA: Diagnosis not present

## 2017-05-24 DIAGNOSIS — G4700 Insomnia, unspecified: Secondary | ICD-10-CM | POA: Diagnosis not present

## 2017-05-24 DIAGNOSIS — R51 Headache: Secondary | ICD-10-CM | POA: Insufficient documentation

## 2017-05-24 DIAGNOSIS — S14109A Unspecified injury at unspecified level of cervical spinal cord, initial encounter: Secondary | ICD-10-CM | POA: Insufficient documentation

## 2017-05-24 DIAGNOSIS — M4722 Other spondylosis with radiculopathy, cervical region: Secondary | ICD-10-CM | POA: Diagnosis not present

## 2017-05-24 DIAGNOSIS — S14105S Unspecified injury at C5 level of cervical spinal cord, sequela: Secondary | ICD-10-CM | POA: Diagnosis not present

## 2017-05-24 DIAGNOSIS — M5416 Radiculopathy, lumbar region: Secondary | ICD-10-CM | POA: Diagnosis not present

## 2017-05-24 DIAGNOSIS — R252 Cramp and spasm: Secondary | ICD-10-CM | POA: Diagnosis not present

## 2017-05-24 DIAGNOSIS — G8250 Quadriplegia, unspecified: Secondary | ICD-10-CM

## 2017-05-24 DIAGNOSIS — G8254 Quadriplegia, C5-C7 incomplete: Secondary | ICD-10-CM | POA: Insufficient documentation

## 2017-05-24 DIAGNOSIS — K219 Gastro-esophageal reflux disease without esophagitis: Secondary | ICD-10-CM | POA: Insufficient documentation

## 2017-05-24 DIAGNOSIS — R531 Weakness: Secondary | ICD-10-CM | POA: Diagnosis not present

## 2017-05-24 DIAGNOSIS — N3289 Other specified disorders of bladder: Secondary | ICD-10-CM | POA: Diagnosis not present

## 2017-05-24 DIAGNOSIS — R200 Anesthesia of skin: Secondary | ICD-10-CM | POA: Insufficient documentation

## 2017-05-24 DIAGNOSIS — R52 Pain, unspecified: Secondary | ICD-10-CM | POA: Insufficient documentation

## 2017-05-24 MED ORDER — PREDNISONE 10 MG PO TABS: 10.0000 mg | ORAL_TABLET | Freq: Every day | ORAL | 0 refills | Status: DC

## 2017-05-24 MED ORDER — MORPHINE SULFATE 15 MG PO TABS: 15.0000 mg | ORAL_TABLET | Freq: Four times a day (QID) | ORAL | 0 refills | Status: DC | PRN

## 2017-05-24 MED ORDER — DANTROLENE SODIUM 100 MG PO CAPS: 100.0000 mg | ORAL_CAPSULE | Freq: Four times a day (QID) | ORAL | 6 refills | Status: DC
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
## 2017-05-24 NOTE — Patient Instructions (Signed)
PLEASE FEEL FREE TO CALL OUR OFFICE WITH ANY PROBLEMS OR QUESTIONS ([phone])  HAVE A HAPPY HOLIDAYS!                     ^                  ^^                ^ ^ ^             ^ ^ ^ ^ ^           ^ ^ ^ ^ ^ ^ ^        ^ ^ ^ ^ ^ ^ ^ ^ ^      ^ ^ ^ ^ ^ ^ ^ ^ ^ ^ ^                ^^^^                ^^^^                ^^^^     

## 2017-06-18 ENCOUNTER — Other Ambulatory Visit: Payer: Self-pay | Admitting: Physical Medicine & Rehabilitation

## 2017-06-23 ENCOUNTER — Other Ambulatory Visit: Payer: Self-pay | Admitting: Physical Medicine & Rehabilitation

## 2017-06-23 DIAGNOSIS — G8250 Quadriplegia, unspecified: Secondary | ICD-10-CM

## 2017-06-23 DIAGNOSIS — S14105S Unspecified injury at C5 level of cervical spinal cord, sequela: Secondary | ICD-10-CM

## 2017-06-23 DIAGNOSIS — M4722 Other spondylosis with radiculopathy, cervical region: Secondary | ICD-10-CM

## 2017-06-23 DIAGNOSIS — M5416 Radiculopathy, lumbar region: Secondary | ICD-10-CM

## 2017-06-23 MED ORDER — MORPHINE SULFATE 15 MG PO TABS: 15.0000 mg | ORAL_TABLET | Freq: Four times a day (QID) | ORAL | 0 refills | Status: DC | PRN

## 2017-06-23 NOTE — Telephone Encounter (Signed)
Meds printed and signed by provider, patent called to pick up meds at there convience.

## 2017-06-30 ENCOUNTER — Encounter: Payer: Self-pay | Admitting: Physical Medicine & Rehabilitation

## 2017-06-30 ENCOUNTER — Encounter: Payer: 59 | Attending: Physical Medicine & Rehabilitation | Admitting: Physical Medicine & Rehabilitation

## 2017-06-30 ENCOUNTER — Other Ambulatory Visit: Payer: Self-pay

## 2017-06-30 VITALS — BP 120/82 | HR 99

## 2017-06-30 DIAGNOSIS — G8254 Quadriplegia, C5-C7 incomplete: Secondary | ICD-10-CM | POA: Diagnosis not present

## 2017-06-30 DIAGNOSIS — Z5181 Encounter for therapeutic drug level monitoring: Secondary | ICD-10-CM | POA: Diagnosis not present

## 2017-06-30 DIAGNOSIS — G8250 Quadriplegia, unspecified: Secondary | ICD-10-CM | POA: Diagnosis not present

## 2017-06-30 DIAGNOSIS — R52 Pain, unspecified: Secondary | ICD-10-CM | POA: Insufficient documentation

## 2017-06-30 DIAGNOSIS — S14105S Unspecified injury at C5 level of cervical spinal cord, sequela: Secondary | ICD-10-CM | POA: Diagnosis not present

## 2017-06-30 DIAGNOSIS — S14109A Unspecified injury at unspecified level of cervical spinal cord, initial encounter: Secondary | ICD-10-CM | POA: Diagnosis not present

## 2017-06-30 DIAGNOSIS — R252 Cramp and spasm: Secondary | ICD-10-CM | POA: Diagnosis not present

## 2017-06-30 DIAGNOSIS — M4722 Other spondylosis with radiculopathy, cervical region: Secondary | ICD-10-CM

## 2017-06-30 DIAGNOSIS — K219 Gastro-esophageal reflux disease without esophagitis: Secondary | ICD-10-CM | POA: Diagnosis not present

## 2017-06-30 DIAGNOSIS — R531 Weakness: Secondary | ICD-10-CM | POA: Diagnosis not present

## 2017-06-30 DIAGNOSIS — G4700 Insomnia, unspecified: Secondary | ICD-10-CM | POA: Insufficient documentation

## 2017-06-30 DIAGNOSIS — R51 Headache: Secondary | ICD-10-CM | POA: Insufficient documentation

## 2017-06-30 DIAGNOSIS — N3289 Other specified disorders of bladder: Secondary | ICD-10-CM | POA: Diagnosis not present

## 2017-06-30 DIAGNOSIS — R011 Cardiac murmur, unspecified: Secondary | ICD-10-CM | POA: Diagnosis not present

## 2017-06-30 DIAGNOSIS — G894 Chronic pain syndrome: Secondary | ICD-10-CM

## 2017-06-30 DIAGNOSIS — R200 Anesthesia of skin: Secondary | ICD-10-CM | POA: Diagnosis not present

## 2017-06-30 DIAGNOSIS — Z79899 Other long term (current) drug therapy: Secondary | ICD-10-CM

## 2017-06-30 DIAGNOSIS — M5416 Radiculopathy, lumbar region: Secondary | ICD-10-CM

## 2017-06-30 MED ORDER — MORPHINE SULFATE 15 MG PO TABS: 15.0000 mg | ORAL_TABLET | Freq: Four times a day (QID) | ORAL | 0 refills | Status: DC | PRN

## 2017-06-30 NOTE — Patient Instructions (Signed)
PLEASE FEEL FREE TO CALL OUR OFFICE WITH ANY PROBLEMS OR QUESTIONS ([phone])      

## 2017-06-30 NOTE — Progress Notes (Signed)
Dysport Injection for spasticity using needle EMG guidance Indication:  Spastic tetraplegia   Dilution: 500 Units/245ml        Total Units Injected:  1000 Indication: Severe spasticity which interferes with ADL,mobility and/or  hygiene and is unresponsive to medication management and other conservative care Informed consent was obtained after describing risks and benefits of the procedure with the patient. This includes bleeding, bruising, infection, excessive weakness, or medication side effects. A REMS form is on file and signed.  Needle: 50mm injectable monopolar needle electrode  Number of units per muscle Pectoralis Major 0 units Pectoralis Minor 0 units Biceps 0 units Brachioradialis 0 units FCR 0 units FCU 0 units FDS 150 units right, 100 units left FDP 150 units, 100 units left FPL  0 units Pronator Teres 0 units Pronator Quadratus 0 units Hamstrings 500 units, 6 access points  All injections were done after obtaining appropriate EMG activity and after negative drawback for blood. The patient tolerated the procedure well. Post procedure instructions were given.

## 2017-06-30 NOTE — Addendum Note (Signed)
Addended by: Angela NevinWESSLING, Linnie Mcglocklin D on: 06/30/2017 03:59 PM   Modules accepted: Orders

## 2017-07-06 ENCOUNTER — Other Ambulatory Visit: Payer: Self-pay | Admitting: Physical Medicine & Rehabilitation

## 2017-07-08 ENCOUNTER — Telehealth: Payer: Self-pay

## 2017-07-08 MED ORDER — NORTRIPTYLINE HCL 25 MG PO CAPS: 25.0000 mg | ORAL_CAPSULE | Freq: Every day | ORAL | 4 refills | Status: DC

## 2017-07-08 NOTE — Telephone Encounter (Signed)
Patients husband called a refill for Pamelor, refilled per instructions of previous doctors note.

## 2017-07-13 ENCOUNTER — Other Ambulatory Visit: Payer: Self-pay | Admitting: Physical Medicine & Rehabilitation

## 2017-07-13 ENCOUNTER — Telehealth: Payer: Self-pay

## 2017-07-13 DIAGNOSIS — M5416 Radiculopathy, lumbar region: Secondary | ICD-10-CM

## 2017-07-13 DIAGNOSIS — G8254 Quadriplegia, C5-C7 incomplete: Secondary | ICD-10-CM

## 2017-07-13 DIAGNOSIS — S14105S Unspecified injury at C5 level of cervical spinal cord, sequela: Secondary | ICD-10-CM

## 2017-07-13 NOTE — Telephone Encounter (Signed)
Recieved electronic medication refill request for sulindac (clinoril), patient hasn't been prescribed this medication since august of 2018, is it ok to refill this medication, please advise.

## 2017-07-14 MED ORDER — SULINDAC 150 MG PO TABS: 150.0000 mg | ORAL_TABLET | Freq: Two times a day (BID) | ORAL | 4 refills | Status: DC

## 2017-07-14 NOTE — Telephone Encounter (Signed)
noted 

## 2017-07-14 NOTE — Telephone Encounter (Signed)
I refilled med

## 2017-08-17 ENCOUNTER — Telehealth: Payer: Self-pay | Admitting: Physical Medicine & Rehabilitation

## 2017-08-17 NOTE — Telephone Encounter (Signed)
That would be fine, thanks.

## 2017-08-17 NOTE — Telephone Encounter (Signed)
Tresa EndoKelly with [facility] called and states Dr. Nyra JabsKorveda would like to ask if it's okay to increase Jasmine [name]'s Nortriptyline from 25 to 50 mg.  She is having abdominal pain - he knows you are the prescriber and doesn't want to change without your approval.  Private voicemail for response [phone]

## 2017-08-17 NOTE — Telephone Encounter (Signed)
Contacted secure voicemail and gave the approval

## 2017-08-25 ENCOUNTER — Encounter: Payer: Self-pay | Admitting: Physical Medicine & Rehabilitation

## 2017-08-25 ENCOUNTER — Encounter: Payer: 59 | Attending: Physical Medicine & Rehabilitation | Admitting: Physical Medicine & Rehabilitation

## 2017-08-25 VITALS — BP 104/72 | HR 94

## 2017-08-25 DIAGNOSIS — R51 Headache: Secondary | ICD-10-CM | POA: Diagnosis not present

## 2017-08-25 DIAGNOSIS — M5416 Radiculopathy, lumbar region: Secondary | ICD-10-CM

## 2017-08-25 DIAGNOSIS — R531 Weakness: Secondary | ICD-10-CM | POA: Diagnosis not present

## 2017-08-25 DIAGNOSIS — G4700 Insomnia, unspecified: Secondary | ICD-10-CM | POA: Insufficient documentation

## 2017-08-25 DIAGNOSIS — G8250 Quadriplegia, unspecified: Secondary | ICD-10-CM

## 2017-08-25 DIAGNOSIS — K219 Gastro-esophageal reflux disease without esophagitis: Secondary | ICD-10-CM | POA: Diagnosis not present

## 2017-08-25 DIAGNOSIS — R011 Cardiac murmur, unspecified: Secondary | ICD-10-CM | POA: Diagnosis not present

## 2017-08-25 DIAGNOSIS — S14105S Unspecified injury at C5 level of cervical spinal cord, sequela: Secondary | ICD-10-CM

## 2017-08-25 DIAGNOSIS — M4722 Other spondylosis with radiculopathy, cervical region: Secondary | ICD-10-CM

## 2017-08-25 DIAGNOSIS — S14109A Unspecified injury at unspecified level of cervical spinal cord, initial encounter: Secondary | ICD-10-CM | POA: Insufficient documentation

## 2017-08-25 DIAGNOSIS — R252 Cramp and spasm: Secondary | ICD-10-CM | POA: Diagnosis not present

## 2017-08-25 DIAGNOSIS — G8254 Quadriplegia, C5-C7 incomplete: Secondary | ICD-10-CM | POA: Diagnosis present

## 2017-08-25 DIAGNOSIS — R200 Anesthesia of skin: Secondary | ICD-10-CM | POA: Diagnosis not present

## 2017-08-25 DIAGNOSIS — N3289 Other specified disorders of bladder: Secondary | ICD-10-CM | POA: Diagnosis not present

## 2017-08-25 DIAGNOSIS — R52 Pain, unspecified: Secondary | ICD-10-CM | POA: Insufficient documentation

## 2017-08-25 MED ORDER — MORPHINE SULFATE 15 MG PO TABS: 15.0000 mg | ORAL_TABLET | Freq: Four times a day (QID) | ORAL | 0 refills | Status: DC | PRN

## 2017-08-25 MED ORDER — NORTRIPTYLINE HCL 25 MG PO CAPS
50.0000 mg | ORAL_CAPSULE | Freq: Every day | ORAL | 4 refills | Status: DC
Start: 2017-08-25 — End: 2018-01-10
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
## 2017-08-25 NOTE — Patient Instructions (Signed)
I WANT YOU TO WORK ON RELAXATION, MEDITATION, MINDFULNESS STRATEGIES TO DEAL WITH PAIN AND SPASMS, STRESS   PLEASE FEEL FREE TO CALL OUR OFFICE WITH ANY PROBLEMS OR QUESTIONS [phone])

## 2017-09-06 ENCOUNTER — Encounter: Payer: Self-pay | Admitting: Physical Medicine & Rehabilitation

## 2017-09-07 ENCOUNTER — Encounter: Payer: Self-pay | Admitting: Physical Medicine & Rehabilitation

## 2017-09-14 ENCOUNTER — Other Ambulatory Visit: Payer: Self-pay | Admitting: Physical Medicine & Rehabilitation

## 2017-09-16 ENCOUNTER — Encounter: Payer: Self-pay | Admitting: Physical Medicine & Rehabilitation

## 2017-09-17 ENCOUNTER — Telehealth: Payer: Self-pay | Admitting: Physical Medicine & Rehabilitation

## 2017-09-17 NOTE — Telephone Encounter (Signed)
Jasmine JunesBrandon returned call, states he just wanted to know the status on the e-mail that he sent in reguards to nortriptyline, informed him that it was forwarded to the doctor.  He also wanted to know the status on the referral that was made on the 20th of march for her to go to a neurosurgeon for a possible baclofen pump emplacement.

## 2017-09-17 NOTE — Telephone Encounter (Signed)
Pts husband(Brandon) is requesting Bruce to call him regarding 2 issues/questions he has...  Please call # provided.  Thanks, Rosezella FloridaLisa M.

## 2017-09-17 NOTE — Telephone Encounter (Signed)
Called patient back, no answer, left voicemail to return call 

## 2017-09-21 ENCOUNTER — Telehealth: Payer: Self-pay

## 2017-09-21 NOTE — Telephone Encounter (Signed)
[name] from [facility] called stating that they sent a fax on pt for changes, increased in supplies.

## 2017-09-27 ENCOUNTER — Other Ambulatory Visit: Payer: Self-pay | Admitting: Physical Medicine & Rehabilitation

## 2017-09-27 DIAGNOSIS — M5416 Radiculopathy, lumbar region: Secondary | ICD-10-CM

## 2017-09-27 DIAGNOSIS — G8250 Quadriplegia, unspecified: Secondary | ICD-10-CM

## 2017-09-27 DIAGNOSIS — S14105S Unspecified injury at C5 level of cervical spinal cord, sequela: Secondary | ICD-10-CM

## 2017-09-27 DIAGNOSIS — M4722 Other spondylosis with radiculopathy, cervical region: Secondary | ICD-10-CM

## 2017-09-28 ENCOUNTER — Other Ambulatory Visit: Payer: Self-pay | Admitting: Physical Medicine & Rehabilitation

## 2017-09-28 DIAGNOSIS — S14105S Unspecified injury at C5 level of cervical spinal cord, sequela: Secondary | ICD-10-CM

## 2017-09-28 DIAGNOSIS — G8250 Quadriplegia, unspecified: Secondary | ICD-10-CM

## 2017-09-29 ENCOUNTER — Other Ambulatory Visit: Payer: Self-pay | Admitting: Physical Medicine & Rehabilitation

## 2017-10-01 ENCOUNTER — Other Ambulatory Visit: Payer: Self-pay | Admitting: Physical Medicine & Rehabilitation

## 2017-10-01 DIAGNOSIS — S14105S Unspecified injury at C5 level of cervical spinal cord, sequela: Secondary | ICD-10-CM

## 2017-10-01 DIAGNOSIS — M4722 Other spondylosis with radiculopathy, cervical region: Secondary | ICD-10-CM

## 2017-10-01 DIAGNOSIS — M5416 Radiculopathy, lumbar region: Secondary | ICD-10-CM

## 2017-10-01 DIAGNOSIS — G8250 Quadriplegia, unspecified: Secondary | ICD-10-CM

## 2017-10-01 MED ORDER — MORPHINE SULFATE 15 MG PO TABS: 15.0000 mg | ORAL_TABLET | Freq: Four times a day (QID) | ORAL | 0 refills | Status: DC | PRN

## 2017-10-01 NOTE — Telephone Encounter (Signed)
rec'd electronic request for morphine MSIR refill

## 2017-10-10 ENCOUNTER — Other Ambulatory Visit: Payer: Self-pay | Admitting: Physical Medicine & Rehabilitation

## 2017-10-10 DIAGNOSIS — G8250 Quadriplegia, unspecified: Secondary | ICD-10-CM

## 2017-10-10 DIAGNOSIS — M4722 Other spondylosis with radiculopathy, cervical region: Secondary | ICD-10-CM

## 2017-10-10 DIAGNOSIS — M5416 Radiculopathy, lumbar region: Secondary | ICD-10-CM

## 2017-10-10 DIAGNOSIS — S14105S Unspecified injury at C5 level of cervical spinal cord, sequela: Secondary | ICD-10-CM

## 2017-10-10 DIAGNOSIS — Z5181 Encounter for therapeutic drug level monitoring: Secondary | ICD-10-CM

## 2017-10-10 DIAGNOSIS — M4802 Spinal stenosis, cervical region: Secondary | ICD-10-CM

## 2017-10-11 ENCOUNTER — Encounter: Payer: Self-pay | Admitting: Physical Medicine & Rehabilitation

## 2017-10-11 ENCOUNTER — Other Ambulatory Visit: Payer: Self-pay

## 2017-10-12 ENCOUNTER — Encounter: Payer: Self-pay | Admitting: Physical Medicine & Rehabilitation

## 2017-10-12 DIAGNOSIS — S14105S Unspecified injury at C5 level of cervical spinal cord, sequela: Secondary | ICD-10-CM

## 2017-10-12 DIAGNOSIS — G8250 Quadriplegia, unspecified: Secondary | ICD-10-CM

## 2017-10-13 ENCOUNTER — Encounter: Payer: Self-pay | Admitting: Physical Medicine & Rehabilitation

## 2017-10-25 ENCOUNTER — Encounter: Payer: BLUE CROSS/BLUE SHIELD | Admitting: Physical Medicine & Rehabilitation

## 2017-11-03 ENCOUNTER — Telehealth: Payer: Self-pay

## 2017-11-03 ENCOUNTER — Other Ambulatory Visit: Payer: Self-pay | Admitting: Physical Medicine & Rehabilitation

## 2017-11-03 DIAGNOSIS — G8250 Quadriplegia, unspecified: Secondary | ICD-10-CM

## 2017-11-03 DIAGNOSIS — M4722 Other spondylosis with radiculopathy, cervical region: Secondary | ICD-10-CM

## 2017-11-03 DIAGNOSIS — S14105S Unspecified injury at C5 level of cervical spinal cord, sequela: Secondary | ICD-10-CM

## 2017-11-03 DIAGNOSIS — M5416 Radiculopathy, lumbar region: Secondary | ICD-10-CM

## 2017-11-03 MED ORDER — MORPHINE SULFATE 15 MG PO TABS: 15.0000 mg | ORAL_TABLET | Freq: Four times a day (QID) | ORAL | 0 refills | Status: DC | PRN

## 2017-11-03 NOTE — Telephone Encounter (Signed)
rx written

## 2017-11-03 NOTE — Telephone Encounter (Signed)
Medication refill requested for MSIR  last filled 10/01/17. Next appt 11/17/17. [facility]

## 2017-11-05 ENCOUNTER — Encounter: Payer: Self-pay | Admitting: Physical Medicine & Rehabilitation

## 2017-11-10 NOTE — Telephone Encounter (Signed)
Referral made to [facility] and ot

## 2017-11-16 ENCOUNTER — Telehealth: Payer: Self-pay

## 2017-11-16 NOTE — Telephone Encounter (Signed)
?  What??? [facility] called on Saturday wanting an order asking if it "was alright to look for the patient" as they could not locate her. (I am not kidding). I told them to go back out to her address Monday for follow up, nothing more.

## 2017-11-16 NOTE — Telephone Encounter (Signed)
Keesa from [facility] called stating that the therapist went out to the home to provide services and the husband said pt was not feeling well and to try again next week. A call was called into admin on Sunday and it told if did not get in to see pt by 6/10 to call the office.

## 2017-11-17 ENCOUNTER — Encounter: Payer: 59 | Attending: Physical Medicine & Rehabilitation | Admitting: Physical Medicine & Rehabilitation

## 2017-11-17 ENCOUNTER — Ambulatory Visit
Admission: RE | Admit: 2017-11-17 | Discharge: 2017-11-17 | Disposition: A | Discharge status: Home / Self Care | Payer: 59 | Source: Ambulatory Visit | Attending: Physical Medicine & Rehabilitation | Admitting: Physical Medicine & Rehabilitation

## 2017-11-17 ENCOUNTER — Encounter: Payer: Self-pay | Admitting: Physical Medicine & Rehabilitation

## 2017-11-17 VITALS — BP 112/80 | HR 102 | Resp 14

## 2017-11-17 DIAGNOSIS — R200 Anesthesia of skin: Secondary | ICD-10-CM | POA: Diagnosis not present

## 2017-11-17 DIAGNOSIS — R011 Cardiac murmur, unspecified: Secondary | ICD-10-CM | POA: Diagnosis not present

## 2017-11-17 DIAGNOSIS — S14109A Unspecified injury at unspecified level of cervical spinal cord, initial encounter: Secondary | ICD-10-CM | POA: Diagnosis not present

## 2017-11-17 DIAGNOSIS — G8250 Quadriplegia, unspecified: Secondary | ICD-10-CM | POA: Diagnosis not present

## 2017-11-17 DIAGNOSIS — R52 Pain, unspecified: Secondary | ICD-10-CM | POA: Insufficient documentation

## 2017-11-17 DIAGNOSIS — K219 Gastro-esophageal reflux disease without esophagitis: Secondary | ICD-10-CM | POA: Insufficient documentation

## 2017-11-17 DIAGNOSIS — N3289 Other specified disorders of bladder: Secondary | ICD-10-CM | POA: Insufficient documentation

## 2017-11-17 DIAGNOSIS — R51 Headache: Secondary | ICD-10-CM | POA: Insufficient documentation

## 2017-11-17 DIAGNOSIS — K592 Neurogenic bowel, not elsewhere classified: Secondary | ICD-10-CM

## 2017-11-17 DIAGNOSIS — G8254 Quadriplegia, C5-C7 incomplete: Secondary | ICD-10-CM | POA: Diagnosis present

## 2017-11-17 DIAGNOSIS — G4700 Insomnia, unspecified: Secondary | ICD-10-CM | POA: Diagnosis not present

## 2017-11-17 DIAGNOSIS — M5416 Radiculopathy, lumbar region: Secondary | ICD-10-CM

## 2017-11-17 DIAGNOSIS — M4722 Other spondylosis with radiculopathy, cervical region: Secondary | ICD-10-CM

## 2017-11-17 DIAGNOSIS — R252 Cramp and spasm: Secondary | ICD-10-CM | POA: Insufficient documentation

## 2017-11-17 DIAGNOSIS — R531 Weakness: Secondary | ICD-10-CM | POA: Insufficient documentation

## 2017-11-17 DIAGNOSIS — S14105S Unspecified injury at C5 level of cervical spinal cord, sequela: Secondary | ICD-10-CM

## 2017-11-17 MED ORDER — MORPHINE SULFATE 15 MG PO TABS: 15.0000 mg | ORAL_TABLET | Freq: Four times a day (QID) | ORAL | 0 refills | Status: DC | PRN

## 2017-11-17 NOTE — Patient Instructions (Signed)
MAKE SURE YOU'RE GETTING ENOUGH FLUIDS AND ELECTROLYTES INCLUDING MAGNESIUM, POTASSIUM, CALCIUM.
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
## 2017-11-17 NOTE — Telephone Encounter (Signed)
Per Dr. Riley KillSwartz, I contacted Birder RobsonKeesa at [facility] and gave authorization to continue home service

## 2017-11-19 ENCOUNTER — Encounter: Payer: Self-pay | Admitting: Physical Medicine & Rehabilitation

## 2017-11-19 NOTE — Telephone Encounter (Signed)
Spoke to husband about KUB. Stool throughout descending colon. They will work on efforts to further evacuate and then establish new regimen.

## 2017-11-24 ENCOUNTER — Telehealth: Payer: Self-pay

## 2017-11-24 NOTE — Telephone Encounter (Signed)
Hughes BetterKeisa called today, stated she went out to [place] residence and was informed that due to an excessive amount of appointments and needs to have home health moved to next week.   She has asked for call back, called her back, left message for her to call us back.

## 2017-11-25 NOTE — Telephone Encounter (Signed)
Noted, no verbal order required.  Next scheduled visit is the 26th of July

## 2017-11-30 ENCOUNTER — Other Ambulatory Visit: Payer: Self-pay | Admitting: Physical Medicine & Rehabilitation

## 2017-11-30 NOTE — Telephone Encounter (Signed)
Refill request for Celexa, not seeing in last note to continue on Celexa. Is it ok to refill?

## 2017-12-01 ENCOUNTER — Other Ambulatory Visit: Payer: Self-pay | Admitting: Physical Medicine & Rehabilitation

## 2017-12-01 NOTE — Telephone Encounter (Signed)
Refill request for Lyrica in basket and phoned in on 11/03/17 with 5 refills. Called pharmacy and informed.

## 2017-12-02 ENCOUNTER — Telehealth: Payer: Self-pay

## 2017-12-02 NOTE — Telephone Encounter (Signed)
[facility]rist[facility] for 1wk1, 2wk7 for PT for pt. I do not see any recent orders for PT. Ok for these orders?

## 2017-12-03 NOTE — Telephone Encounter (Signed)
Yes, and as a foot note, if I have already ordered something (such as home health therapy), RN/CMA can just approve without forwarding to me. I don't need to "re-confirm".  thanks

## 2017-12-03 NOTE — Telephone Encounter (Signed)
Belenda CruiseKristin notified for PT

## 2017-12-05 ENCOUNTER — Other Ambulatory Visit: Payer: Self-pay | Admitting: Physical Medicine & Rehabilitation

## 2017-12-05 DIAGNOSIS — S14105S Unspecified injury at C5 level of cervical spinal cord, sequela: Secondary | ICD-10-CM

## 2017-12-05 DIAGNOSIS — G8250 Quadriplegia, unspecified: Secondary | ICD-10-CM

## 2017-12-07 ENCOUNTER — Encounter: Payer: Self-pay | Admitting: Physical Medicine & Rehabilitation

## 2017-12-08 NOTE — Telephone Encounter (Signed)
Celexa was just refilled on 12/03/17 with 2 additional refills

## 2017-12-14 ENCOUNTER — Telehealth: Payer: Self-pay | Admitting: Physical Medicine & Rehabilitation

## 2017-12-14 NOTE — Telephone Encounter (Signed)
That's fine. She can be set up at an open spot before then. 500 units.

## 2017-12-14 NOTE — Telephone Encounter (Signed)
Dr Riley KillSwartz - husband called and states ptn in pain and would like to get in earlier than August appointment on schedule and if possible can she get botox again.  If yes (last injection was January) I will forward to L Miller to preauth.

## 2017-12-15 NOTE — Telephone Encounter (Signed)
Letter written for pt

## 2017-12-15 NOTE — Telephone Encounter (Signed)
Letter printed and put upfront for patient to pick-up

## 2017-12-17 ENCOUNTER — Other Ambulatory Visit: Payer: Self-pay | Admitting: Anesthesiology

## 2017-12-17 ENCOUNTER — Other Ambulatory Visit: Payer: Self-pay | Admitting: Physical Medicine & Rehabilitation

## 2017-12-17 ENCOUNTER — Encounter: Payer: Self-pay | Admitting: Family Medicine

## 2017-12-17 DIAGNOSIS — K592 Neurogenic bowel, not elsewhere classified: Secondary | ICD-10-CM

## 2017-12-17 DIAGNOSIS — M4722 Other spondylosis with radiculopathy, cervical region: Secondary | ICD-10-CM

## 2017-12-17 DIAGNOSIS — M5416 Radiculopathy, lumbar region: Secondary | ICD-10-CM

## 2017-12-17 DIAGNOSIS — S14105S Unspecified injury at C5 level of cervical spinal cord, sequela: Secondary | ICD-10-CM

## 2017-12-17 DIAGNOSIS — G8250 Quadriplegia, unspecified: Secondary | ICD-10-CM

## 2017-12-17 MED ORDER — MORPHINE SULFATE 15 MG PO TABS: 15.0000 mg | ORAL_TABLET | Freq: Four times a day (QID) | ORAL | 0 refills | Status: DC | PRN

## 2017-12-22 ENCOUNTER — Other Ambulatory Visit: Payer: Self-pay | Admitting: Physical Medicine & Rehabilitation

## 2017-12-22 DIAGNOSIS — S14105S Unspecified injury at C5 level of cervical spinal cord, sequela: Secondary | ICD-10-CM

## 2017-12-22 DIAGNOSIS — M5416 Radiculopathy, lumbar region: Secondary | ICD-10-CM

## 2017-12-22 DIAGNOSIS — K592 Neurogenic bowel, not elsewhere classified: Secondary | ICD-10-CM

## 2017-12-22 DIAGNOSIS — G8250 Quadriplegia, unspecified: Secondary | ICD-10-CM

## 2017-12-22 DIAGNOSIS — M4722 Other spondylosis with radiculopathy, cervical region: Secondary | ICD-10-CM

## 2017-12-22 MED ORDER — NORTRIPTYLINE HCL 25 MG PO CAPS: 25.0000 mg | ORAL_CAPSULE | Freq: Every day | ORAL | 4 refills | Status: DC

## 2017-12-27 ENCOUNTER — Other Ambulatory Visit: Payer: Self-pay | Admitting: Physical Medicine & Rehabilitation

## 2017-12-27 DIAGNOSIS — S14105S Unspecified injury at C5 level of cervical spinal cord, sequela: Secondary | ICD-10-CM

## 2017-12-27 DIAGNOSIS — G8250 Quadriplegia, unspecified: Secondary | ICD-10-CM

## 2017-12-27 DIAGNOSIS — M4722 Other spondylosis with radiculopathy, cervical region: Secondary | ICD-10-CM

## 2018-01-07 ENCOUNTER — Telehealth: Payer: Self-pay | Admitting: *Deleted

## 2018-01-07 ENCOUNTER — Encounter (HOSPITAL_COMMUNITY): Payer: Self-pay

## 2018-01-07 ENCOUNTER — Ambulatory Visit (HOSPITAL_COMMUNITY): Admit: 2018-01-07 | Payer: 59 | Admitting: Anesthesiology

## 2018-01-07 SURGERY — INTRATHECAL PUMP IMPLANT
Anesthesia: General

## 2018-01-07 NOTE — Telephone Encounter (Signed)
[facility] called to report that [name] cancelled PT visits x 2 this week due to some bowel issues. The will resume visits next week.

## 2018-01-10 ENCOUNTER — Encounter: Payer: 59 | Attending: Physical Medicine & Rehabilitation | Admitting: Physical Medicine & Rehabilitation

## 2018-01-10 ENCOUNTER — Encounter: Payer: Self-pay | Admitting: Physical Medicine & Rehabilitation

## 2018-01-10 ENCOUNTER — Telehealth: Payer: Self-pay | Admitting: *Deleted

## 2018-01-10 VITALS — BP 110/79 | HR 96 | Ht 61.0 in | Wt 160.0 lb

## 2018-01-10 DIAGNOSIS — G8250 Quadriplegia, unspecified: Secondary | ICD-10-CM | POA: Diagnosis not present

## 2018-01-10 DIAGNOSIS — S14105S Unspecified injury at C5 level of cervical spinal cord, sequela: Secondary | ICD-10-CM | POA: Diagnosis not present

## 2018-01-10 DIAGNOSIS — R252 Cramp and spasm: Secondary | ICD-10-CM | POA: Diagnosis not present

## 2018-01-10 DIAGNOSIS — K219 Gastro-esophageal reflux disease without esophagitis: Secondary | ICD-10-CM | POA: Insufficient documentation

## 2018-01-10 DIAGNOSIS — G4700 Insomnia, unspecified: Secondary | ICD-10-CM | POA: Insufficient documentation

## 2018-01-10 DIAGNOSIS — G8254 Quadriplegia, C5-C7 incomplete: Secondary | ICD-10-CM | POA: Diagnosis present

## 2018-01-10 DIAGNOSIS — R200 Anesthesia of skin: Secondary | ICD-10-CM | POA: Insufficient documentation

## 2018-01-10 DIAGNOSIS — N3289 Other specified disorders of bladder: Secondary | ICD-10-CM | POA: Diagnosis not present

## 2018-01-10 DIAGNOSIS — Z5181 Encounter for therapeutic drug level monitoring: Secondary | ICD-10-CM

## 2018-01-10 DIAGNOSIS — M5416 Radiculopathy, lumbar region: Secondary | ICD-10-CM

## 2018-01-10 DIAGNOSIS — M4722 Other spondylosis with radiculopathy, cervical region: Secondary | ICD-10-CM | POA: Diagnosis not present

## 2018-01-10 DIAGNOSIS — S14109A Unspecified injury at unspecified level of cervical spinal cord, initial encounter: Secondary | ICD-10-CM | POA: Insufficient documentation

## 2018-01-10 DIAGNOSIS — R52 Pain, unspecified: Secondary | ICD-10-CM | POA: Insufficient documentation

## 2018-01-10 DIAGNOSIS — R011 Cardiac murmur, unspecified: Secondary | ICD-10-CM | POA: Insufficient documentation

## 2018-01-10 DIAGNOSIS — R531 Weakness: Secondary | ICD-10-CM | POA: Insufficient documentation

## 2018-01-10 DIAGNOSIS — K592 Neurogenic bowel, not elsewhere classified: Secondary | ICD-10-CM

## 2018-01-10 DIAGNOSIS — R51 Headache: Secondary | ICD-10-CM | POA: Insufficient documentation

## 2018-01-10 DIAGNOSIS — Z79899 Other long term (current) drug therapy: Secondary | ICD-10-CM

## 2018-01-10 MED ORDER — MORPHINE SULFATE 15 MG PO TABS: 15.0000 mg | ORAL_TABLET | Freq: Four times a day (QID) | ORAL | 0 refills | Status: DC | PRN

## 2018-01-10 NOTE — Progress Notes (Signed)
Dysport Injection for spasticity using needle EMG guidance Indication:  Spastic tetraplegia Bilateral UE's  Dilution: 500 Units/2.515ml        Total Units Injected:  1000 Indication: Severe spasticity which interferes with ADL,mobility and/or  hygiene and is unresponsive to medication management and other conservative care Informed consent was obtained after describing risks and benefits of the procedure with the patient. This includes bleeding, bruising, infection, excessive weakness, or medication side effects. A REMS form is on file and signed.  Needle: 50mm injectable monopolar needle electrode  Number of units per muscle Pectoralis Major 0 units Pectoralis Minor 0 units Biceps 0 units Brachioradialis 0 units FCR right 0 units, left 50 FCU right 0 units, left 50 FDS right 100 units, left 300 FDP right 100 units, left 300 FPL left 50 units Right lumbricals: 50 units Pronator Teres 0 units Pronator Quadratus 0 units  All injections were done after obtaining appropriate EMG activity and after negative drawback for blood. The patient tolerated the procedure well. Post procedure instructions were given.

## 2018-01-10 NOTE — Telephone Encounter (Signed)
Minette Headlandiffany Patterson, PT, [facility] left a message asking for verbal orders  For a reassessment.  Patient denied service last week.  Patient here in clinic today for dysport injection.  Verbal orders given per Dr. Riley KillSwartz/ office protocol

## 2018-01-13 LAB — DRUG TOX ALC METAB W/CON, ORAL FLD: Alcohol Metabolite: NEGATIVE ng/mL (ref <25)

## 2018-01-13 LAB — DRUG TOX MONITOR 1 W/CONF, ORAL FLD
Amphetamines: NEGATIVE ng/mL (ref <10)
Barbiturates: NEGATIVE ng/mL (ref <10)
Benzodiazepines: NEGATIVE ng/mL (ref <0.50)
Buprenorphine: NEGATIVE ng/mL (ref <0.10)
CODEINE: NEGATIVE ng/mL (ref <2.5)
Cocaine: NEGATIVE ng/mL (ref <5.0)
DIHYDROCODEINE: NEGATIVE ng/mL (ref <2.5)
Fentanyl: NEGATIVE ng/mL (ref <0.10)
HEROIN METABOLITE: NEGATIVE ng/mL (ref <1.0)
HYDROCODONE: NEGATIVE ng/mL (ref <2.5)
HYDROMORPHONE: NEGATIVE ng/mL (ref <2.5)
MARIJUANA: NEGATIVE ng/mL (ref <2.5)
MDMA: NEGATIVE ng/mL (ref <10)
Meprobamate: NEGATIVE ng/mL (ref <2.5)
Methadone: NEGATIVE ng/mL (ref <5.0)
Morphine: 5.2 ng/mL — ABNORMAL HIGH (ref <2.5)
NOROXYCODONE: NEGATIVE ng/mL (ref <2.5)
Nicotine Metabolite: NEGATIVE ng/mL (ref <5.0)
Norhydrocodone: NEGATIVE ng/mL (ref <2.5)
OPIATES: POSITIVE ng/mL — AB (ref <2.5)
Oxycodone: NEGATIVE ng/mL (ref <2.5)
Oxymorphone: NEGATIVE ng/mL (ref <2.5)
PHENCYCLIDINE: NEGATIVE ng/mL (ref <10)
TAPENTADOL: NEGATIVE ng/mL (ref <5.0)
TRAMADOL: NEGATIVE ng/mL (ref <5.0)
ZOLPIDEM: NEGATIVE ng/mL (ref <5.0)

## 2018-01-14 ENCOUNTER — Telehealth: Payer: Self-pay | Admitting: *Deleted

## 2018-01-14 ENCOUNTER — Telehealth: Payer: Self-pay

## 2018-01-14 NOTE — Telephone Encounter (Signed)
Tiffany, PT/[facility] called stating she tried to go for a re-accessment this week but the times available pt did not agree with. Will try again next week. Will need a verbal order to see pt next week once. Is this ok?

## 2018-01-14 NOTE — Telephone Encounter (Signed)
Oral swab drug screen was consistent for prescribed medications. Clonazepam was absent. She last filled her clonazepam 12/07/17 and screen done on 01/10/18 so she would have been out of the medication on that date.

## 2018-01-14 NOTE — Telephone Encounter (Signed)
yes

## 2018-01-14 NOTE — Telephone Encounter (Signed)
Left message to notify ok.

## 2018-01-19 ENCOUNTER — Encounter

## 2018-01-19 ENCOUNTER — Encounter: Payer: 59 | Admitting: Physical Medicine & Rehabilitation

## 2018-01-21 ENCOUNTER — Telehealth: Payer: Self-pay

## 2018-01-21 NOTE — Telephone Encounter (Addendum)
[name] for [name] for 1xwk X 1wk followed by 2xwk X 5wks.  Called her back to approve verbal orders, no answer, unable to verify if it is a confidential voicemail, left message to call back for verbal orders.

## 2018-01-21 NOTE — Telephone Encounter (Signed)
Jasmine [name] called back and verbal orders were approved.

## 2018-01-28 ENCOUNTER — Telehealth: Payer: Self-pay

## 2018-01-28 NOTE — Telephone Encounter (Signed)
Yes, thx

## 2018-01-28 NOTE — Telephone Encounter (Signed)
Verbal orders given per Dr. Riley KillSwartz

## 2018-01-28 NOTE — Telephone Encounter (Signed)
Tiffany, PT/[facility] called with update orders to continue PT 2wk6. Is this okay?

## 2018-02-15 ENCOUNTER — Other Ambulatory Visit: Payer: Self-pay | Admitting: Physical Medicine & Rehabilitation

## 2018-02-15 ENCOUNTER — Other Ambulatory Visit: Payer: Self-pay

## 2018-02-15 DIAGNOSIS — S14105S Unspecified injury at C5 level of cervical spinal cord, sequela: Secondary | ICD-10-CM

## 2018-02-15 DIAGNOSIS — G8250 Quadriplegia, unspecified: Secondary | ICD-10-CM

## 2018-02-15 DIAGNOSIS — M5416 Radiculopathy, lumbar region: Secondary | ICD-10-CM

## 2018-02-15 DIAGNOSIS — K592 Neurogenic bowel, not elsewhere classified: Secondary | ICD-10-CM

## 2018-02-15 DIAGNOSIS — M4722 Other spondylosis with radiculopathy, cervical region: Secondary | ICD-10-CM

## 2018-02-16 ENCOUNTER — Other Ambulatory Visit: Payer: Self-pay | Admitting: Physical Medicine & Rehabilitation

## 2018-02-17 ENCOUNTER — Other Ambulatory Visit: Payer: Self-pay

## 2018-02-17 DIAGNOSIS — S14105S Unspecified injury at C5 level of cervical spinal cord, sequela: Secondary | ICD-10-CM

## 2018-02-17 DIAGNOSIS — K592 Neurogenic bowel, not elsewhere classified: Secondary | ICD-10-CM

## 2018-02-17 DIAGNOSIS — M5416 Radiculopathy, lumbar region: Secondary | ICD-10-CM

## 2018-02-17 DIAGNOSIS — G8250 Quadriplegia, unspecified: Secondary | ICD-10-CM

## 2018-02-17 DIAGNOSIS — M4722 Other spondylosis with radiculopathy, cervical region: Secondary | ICD-10-CM

## 2018-02-18 NOTE — Telephone Encounter (Signed)
The morphine rx clearly has a DNF date of 02/14/18. She should have plenty of medication

## 2018-02-19 ENCOUNTER — Other Ambulatory Visit: Payer: Self-pay | Admitting: Physical Medicine & Rehabilitation

## 2018-02-19 DIAGNOSIS — M4722 Other spondylosis with radiculopathy, cervical region: Secondary | ICD-10-CM

## 2018-02-19 DIAGNOSIS — M5416 Radiculopathy, lumbar region: Secondary | ICD-10-CM

## 2018-02-19 DIAGNOSIS — Z5181 Encounter for therapeutic drug level monitoring: Secondary | ICD-10-CM

## 2018-02-19 DIAGNOSIS — M4802 Spinal stenosis, cervical region: Secondary | ICD-10-CM

## 2018-02-19 DIAGNOSIS — G8250 Quadriplegia, unspecified: Secondary | ICD-10-CM

## 2018-02-19 DIAGNOSIS — S14105S Unspecified injury at C5 level of cervical spinal cord, sequela: Secondary | ICD-10-CM

## 2018-02-21 NOTE — Telephone Encounter (Signed)
[facility] had the Rx and they have picked it up.

## 2018-02-22 IMAGING — MR MR CERVICAL SPINE W/O CM
5 series · 35 of 48 positions shown · non-contrast
Comparison: None.

CLINICAL DATA: Status post fall.  Neck pain.

EXAM:
MRI CERVICAL SPINE WITHOUT CONTRAST
TECHNIQUE: Multiplanar, multisequence MR imaging of the cervical spine was
performed. No intravenous contrast was administered.

[Series 2: T2 · sagittal · 3.0mm · 0.70mm/px · 7 of 15 slices shown (1 of 2)]
[im 1/15]
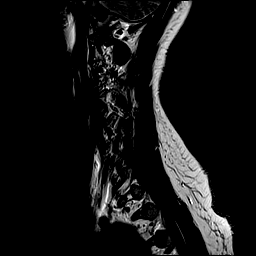
[im 3/15]
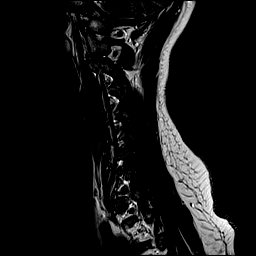
[im 5/15]
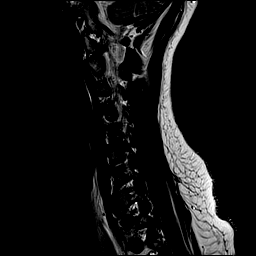
[im 8/15]
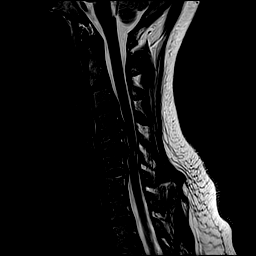
[im 10/15]
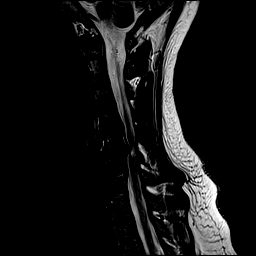
[im 12/15]
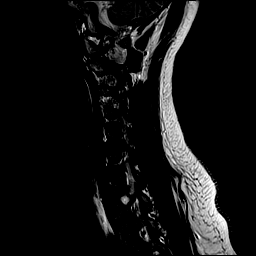
[im 15/15]
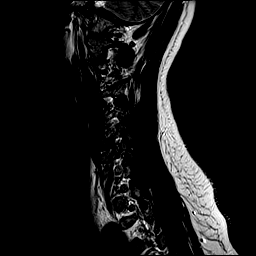

[Series 3: T1 · sagittal · 3.0mm · 0.70mm/px · 7 of 15 slices shown]
[im 1/15]
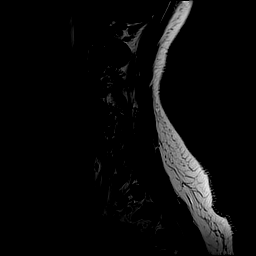
[im 3/15]
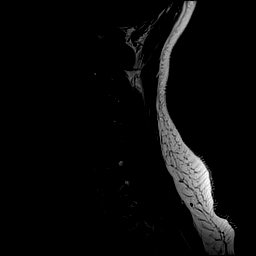
[im 5/15]
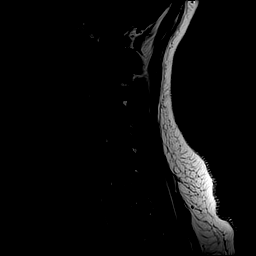
[im 8/15]
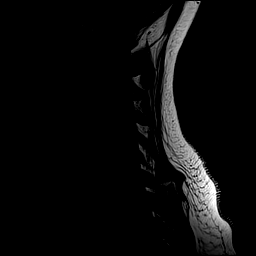
[im 10/15]
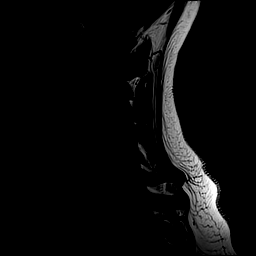
[im 12/15]
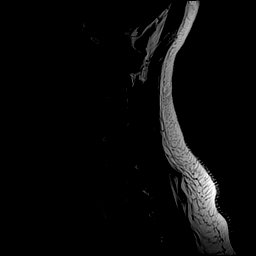
[im 15/15]
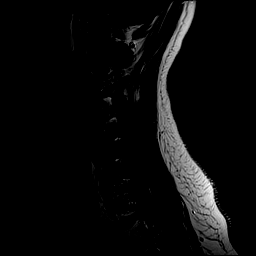

[Series 4: STIR · sagittal · 3.0mm · 0.35mm/px · 8 of 15 slices shown]
[im 1/15]
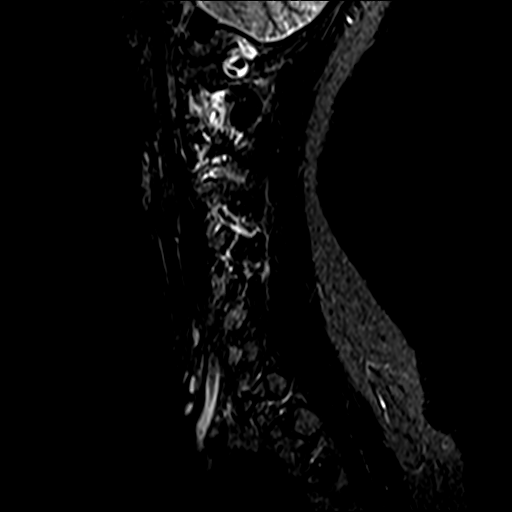
[im 3/15]
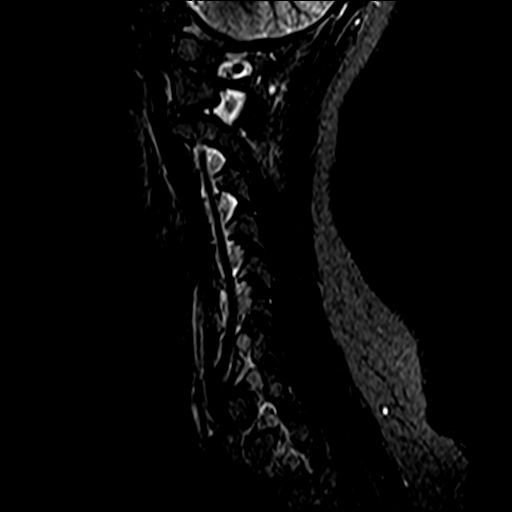
[im 5/15]
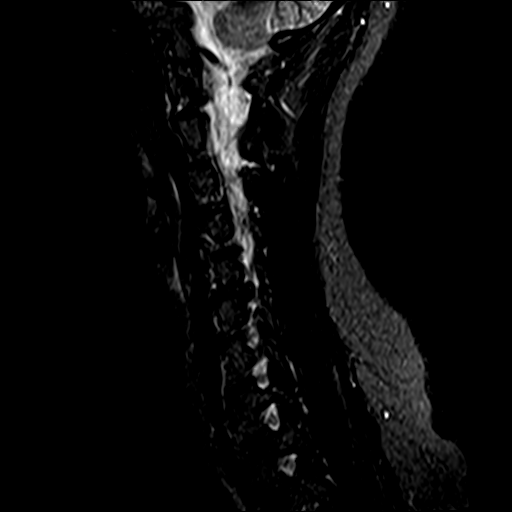
[im 7/15]
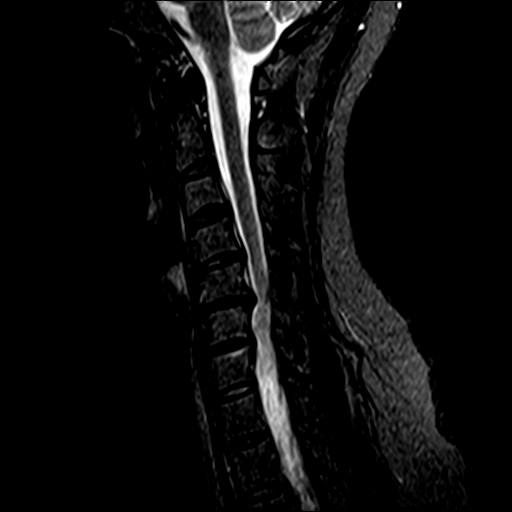
[im 9/15]
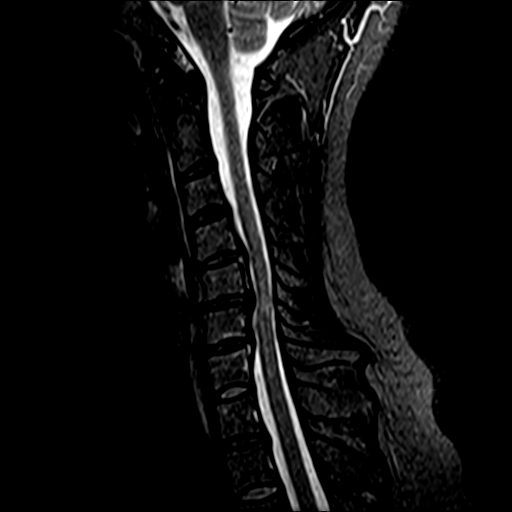
[im 11/15]
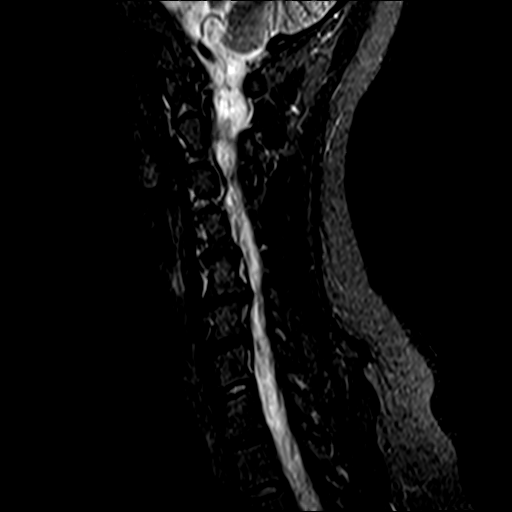
[im 13/15]
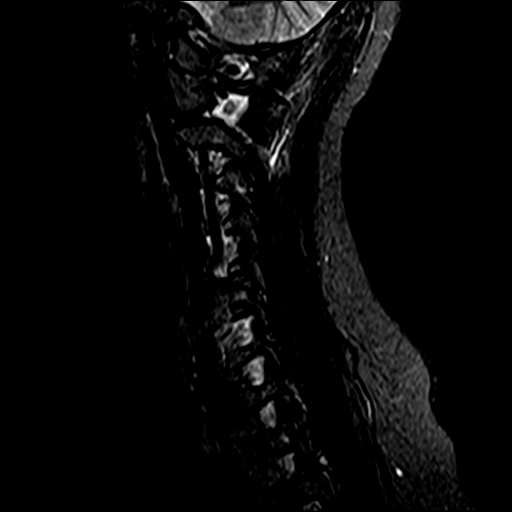
[im 15/15]
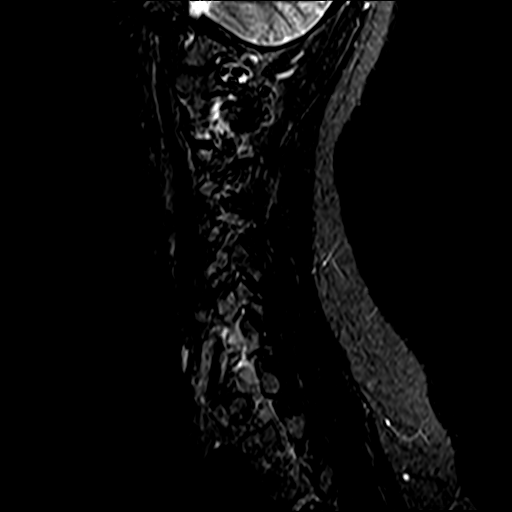

[Series 5: T2 · axial · 3.0mm · 0.70mm/px · z∈[-77,+15]mm · 9 of 25 slices shown (2 of 2)]
[im 1/25]
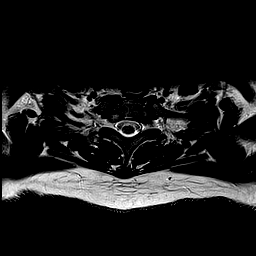
[im 5/25]
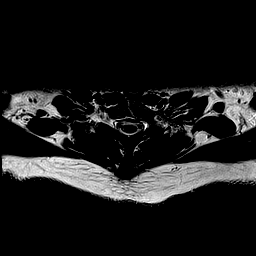
[im 9/25]
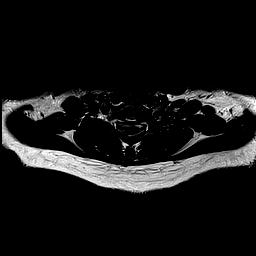
[im 11/25]
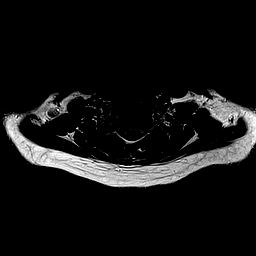
[im 13/25]
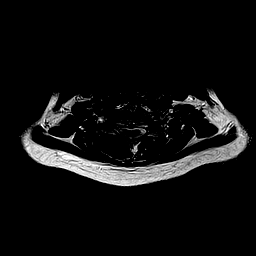
[im 15/25]
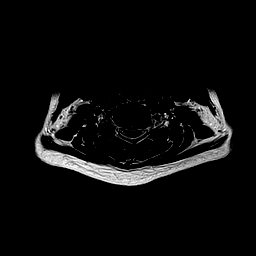
[im 17/25]
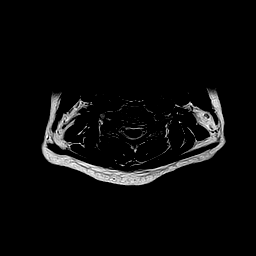
[im 21/25]
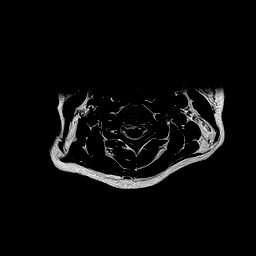
[im 25/25]
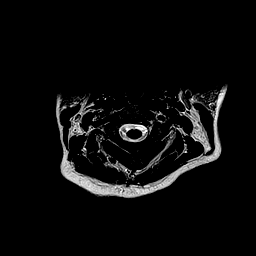

[Series 6: mpgr ax · axial · 3.0mm · 0.35mm/px · z∈[-77,-39]mm · 4 of 25 slices shown]
[im 1/25]
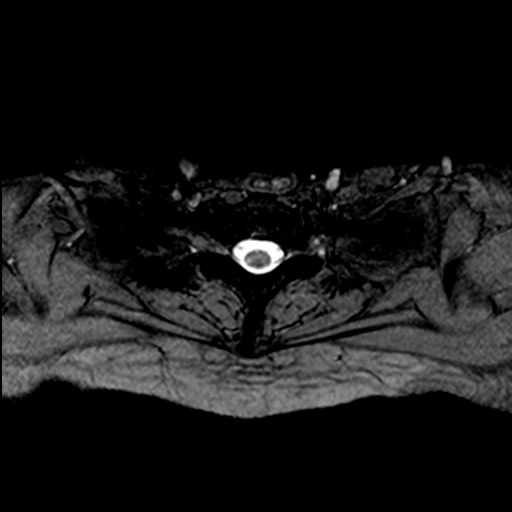
[im 5/25]
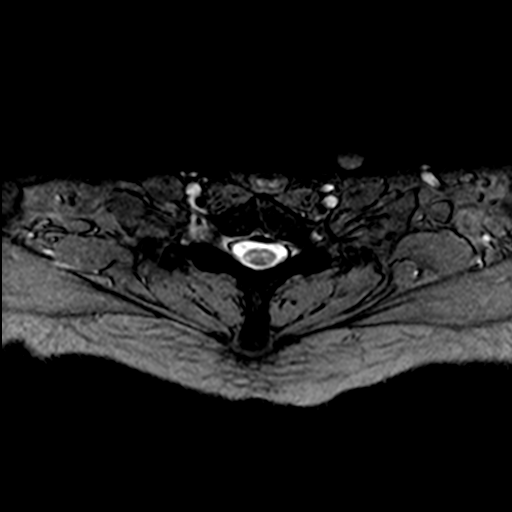
[im 9/25]
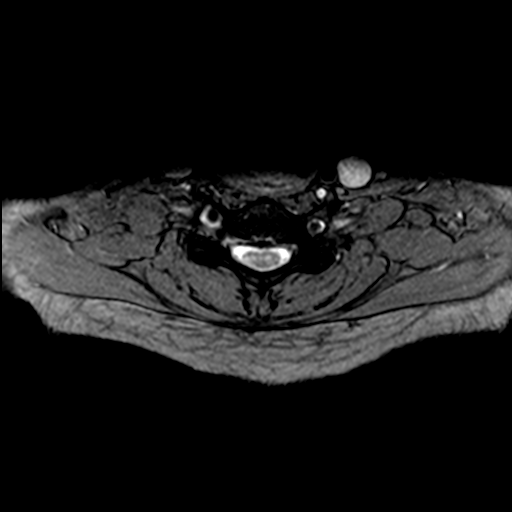
[im 11/25]
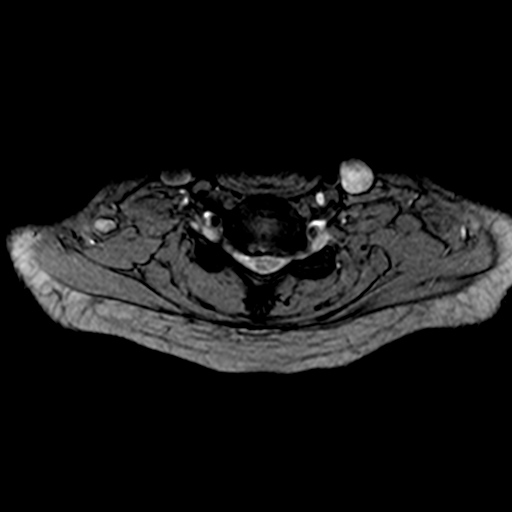

[35 of 48 positions shown; findings below may reference images not displayed]

FINDINGS: Alignment:  Normal cervical lordosis. No static listhesis.

Bones: Vertebral body heights are maintained. Bone marrow signal is
normal.

Spinal cord:  Cervical cord is normal in size and signal.

Posterior fossa: No focal abnormality.

Vessels:  Normal flow voids are present.

Paraspinal tissues:  No focal abnormality.

Disc levels:

Discs: Degenerative disc disease with disc height loss at C5-6.

C2-3: No significant disc bulge. No neural foraminal stenosis. No
central canal stenosis.

C3-4: Small central disc protrusion. No neural foraminal stenosis.
No central canal stenosis.

C4-5: Small central disc protrusion. No neural foraminal stenosis.
No central canal stenosis.

C5-6: Broad central disc protrusion. Bilateral uncovertebral
degenerative changes. Mild right and severe left foraminal stenosis.
Moderate central canal stenosis.

C6-7: Broad-based disc bulge. No neural foraminal stenosis. No
central canal stenosis.

C7-T1: No significant disc bulge. No neural foraminal stenosis. No
central canal stenosis.

T1-2: Small central disc protrusion. No foraminal or central canal
stenosis.
IMPRESSION: 1. Small central disc protrusion at C3-4 and C4-5.
2. At C5-6 there is a broad central disc protrusion. Bilateral
uncovertebral degenerative changes. Mild right and severe left
foraminal stenosis. Moderate central canal stenosis.

## 2018-03-01 ENCOUNTER — Other Ambulatory Visit: Payer: Self-pay

## 2018-03-01 ENCOUNTER — Encounter: Payer: 59 | Attending: Physical Medicine & Rehabilitation | Admitting: Registered Nurse

## 2018-03-01 ENCOUNTER — Encounter: Payer: Self-pay | Admitting: Registered Nurse

## 2018-03-01 VITALS — BP 124/81 | HR 87

## 2018-03-01 DIAGNOSIS — S14105S Unspecified injury at C5 level of cervical spinal cord, sequela: Secondary | ICD-10-CM

## 2018-03-01 DIAGNOSIS — M4722 Other spondylosis with radiculopathy, cervical region: Secondary | ICD-10-CM

## 2018-03-01 DIAGNOSIS — R531 Weakness: Secondary | ICD-10-CM | POA: Insufficient documentation

## 2018-03-01 DIAGNOSIS — K592 Neurogenic bowel, not elsewhere classified: Secondary | ICD-10-CM

## 2018-03-01 DIAGNOSIS — R200 Anesthesia of skin: Secondary | ICD-10-CM | POA: Diagnosis not present

## 2018-03-01 DIAGNOSIS — R51 Headache: Secondary | ICD-10-CM | POA: Insufficient documentation

## 2018-03-01 DIAGNOSIS — K219 Gastro-esophageal reflux disease without esophagitis: Secondary | ICD-10-CM | POA: Diagnosis not present

## 2018-03-01 DIAGNOSIS — R52 Pain, unspecified: Secondary | ICD-10-CM | POA: Insufficient documentation

## 2018-03-01 DIAGNOSIS — G8250 Quadriplegia, unspecified: Secondary | ICD-10-CM

## 2018-03-01 DIAGNOSIS — M5416 Radiculopathy, lumbar region: Secondary | ICD-10-CM

## 2018-03-01 DIAGNOSIS — G894 Chronic pain syndrome: Secondary | ICD-10-CM

## 2018-03-01 DIAGNOSIS — Z79899 Other long term (current) drug therapy: Secondary | ICD-10-CM

## 2018-03-01 DIAGNOSIS — G8254 Quadriplegia, C5-C7 incomplete: Secondary | ICD-10-CM | POA: Diagnosis present

## 2018-03-01 DIAGNOSIS — N3289 Other specified disorders of bladder: Secondary | ICD-10-CM | POA: Insufficient documentation

## 2018-03-01 DIAGNOSIS — R252 Cramp and spasm: Secondary | ICD-10-CM | POA: Insufficient documentation

## 2018-03-01 DIAGNOSIS — S14109A Unspecified injury at unspecified level of cervical spinal cord, initial encounter: Secondary | ICD-10-CM | POA: Diagnosis not present

## 2018-03-01 DIAGNOSIS — Z5181 Encounter for therapeutic drug level monitoring: Secondary | ICD-10-CM

## 2018-03-01 DIAGNOSIS — N319 Neuromuscular dysfunction of bladder, unspecified: Secondary | ICD-10-CM

## 2018-03-01 DIAGNOSIS — G4700 Insomnia, unspecified: Secondary | ICD-10-CM | POA: Insufficient documentation

## 2018-03-01 DIAGNOSIS — R011 Cardiac murmur, unspecified: Secondary | ICD-10-CM | POA: Insufficient documentation

## 2018-03-01 MED ORDER — MORPHINE SULFATE 15 MG PO TABS: 15.0000 mg | ORAL_TABLET | Freq: Four times a day (QID) | ORAL | 0 refills | Status: DC | PRN
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
## 2018-03-07 ENCOUNTER — Encounter: Payer: 59 | Admitting: Physical Medicine & Rehabilitation

## 2018-03-18 ENCOUNTER — Observation Stay
Admission: EM | Admit: 2018-03-18 | Discharge: 2018-03-21 | Disposition: A | Discharge status: Other Acute Inpatient Hospital | Payer: Managed Care, Other (non HMO) | Attending: Internal Medicine | Admitting: Internal Medicine

## 2018-03-18 ENCOUNTER — Other Ambulatory Visit: Payer: Self-pay

## 2018-03-18 DIAGNOSIS — G4700 Insomnia, unspecified: Secondary | ICD-10-CM | POA: Insufficient documentation

## 2018-03-18 DIAGNOSIS — K5900 Constipation, unspecified: Secondary | ICD-10-CM | POA: Insufficient documentation

## 2018-03-18 DIAGNOSIS — M5432 Sciatica, left side: Secondary | ICD-10-CM | POA: Insufficient documentation

## 2018-03-18 DIAGNOSIS — Z8249 Family history of ischemic heart disease and other diseases of the circulatory system: Secondary | ICD-10-CM | POA: Insufficient documentation

## 2018-03-18 DIAGNOSIS — K219 Gastro-esophageal reflux disease without esophagitis: Secondary | ICD-10-CM | POA: Diagnosis not present

## 2018-03-18 DIAGNOSIS — M1990 Unspecified osteoarthritis, unspecified site: Secondary | ICD-10-CM | POA: Diagnosis not present

## 2018-03-18 DIAGNOSIS — R51 Headache: Secondary | ICD-10-CM | POA: Insufficient documentation

## 2018-03-18 DIAGNOSIS — G8220 Paraplegia, unspecified: Secondary | ICD-10-CM | POA: Insufficient documentation

## 2018-03-18 DIAGNOSIS — M62830 Muscle spasm of back: Principal | ICD-10-CM | POA: Insufficient documentation

## 2018-03-18 DIAGNOSIS — M545 Low back pain, unspecified: Secondary | ICD-10-CM

## 2018-03-18 DIAGNOSIS — Z880 Allergy status to penicillin: Secondary | ICD-10-CM | POA: Diagnosis not present

## 2018-03-18 DIAGNOSIS — M549 Dorsalgia, unspecified: Secondary | ICD-10-CM | POA: Diagnosis present

## 2018-03-18 LAB — URINALYSIS, COMPLETE (UACMP) WITH MICROSCOPIC
BILIRUBIN URINE: NEGATIVE
Bacteria, UA: NONE SEEN
Glucose, UA: NEGATIVE mg/dL
HGB URINE DIPSTICK: NEGATIVE
Ketones, ur: NEGATIVE mg/dL
Leukocytes, UA: NEGATIVE
NITRITE: NEGATIVE
PH: 7 (ref 5.0–8.0)
Protein, ur: NEGATIVE mg/dL
SPECIFIC GRAVITY, URINE: 1.006 (ref 1.005–1.030)

## 2018-03-18 MED ORDER — HYDROMORPHONE HCL 1 MG/ML IJ SOLN
0.5000 mg | INTRAMUSCULAR | Status: DC | PRN
  Administered 2018-03-18 – 2018-03-21 (×6): 0.5 mg via INTRAVENOUS
  Filled 2018-03-18 (×6): qty 1

## 2018-03-18 MED ORDER — PLECANATIDE 3 MG PO TABS
3.0000 mg | ORAL_TABLET | Freq: Every day | ORAL | Status: DC
  Administered 2018-03-20: 19:00:00 3 mg via ORAL

## 2018-03-18 MED ORDER — DIAZEPAM 5 MG PO TABS
5.0000 mg | ORAL_TABLET | Freq: Once | ORAL | Status: AC
  Administered 2018-03-18: 5 mg via ORAL
  Filled 2018-03-18: qty 1

## 2018-03-18 MED ORDER — SENNOSIDES-DOCUSATE SODIUM 8.6-50 MG PO TABS: 1.0000 | ORAL_TABLET | Freq: Every evening | ORAL | Status: DC | PRN

## 2018-03-18 MED ORDER — PREDNISONE 10 MG PO TABS: 10.0000 mg | ORAL_TABLET | Freq: Every day | ORAL | Status: DC

## 2018-03-18 MED ORDER — HYDROMORPHONE HCL 1 MG/ML IJ SOLN
1.0000 mg | Freq: Once | INTRAMUSCULAR | Status: AC
  Administered 2018-03-18: 1 mg via INTRAVENOUS
  Filled 2018-03-18: qty 1

## 2018-03-18 MED ORDER — ACETAMINOPHEN 650 MG RE SUPP: 650.0000 mg | Freq: Four times a day (QID) | RECTAL | Status: DC | PRN

## 2018-03-18 MED ORDER — LORAZEPAM 2 MG/ML IJ SOLN
1.0000 mg | Freq: Once | INTRAMUSCULAR | Status: AC
  Administered 2018-03-18: 1 mg via INTRAVENOUS
  Filled 2018-03-18: qty 1

## 2018-03-18 MED ORDER — KETOROLAC TROMETHAMINE 30 MG/ML IJ SOLN
30.0000 mg | Freq: Once | INTRAMUSCULAR | Status: AC
  Administered 2018-03-18: 30 mg via INTRAVENOUS
  Filled 2018-03-18: qty 1

## 2018-03-18 MED ORDER — DIAZEPAM 5 MG PO TABS: 5.0000 mg | ORAL_TABLET | Freq: Three times a day (TID) | ORAL | 0 refills | Status: DC | PRN

## 2018-03-18 MED ORDER — DANTROLENE SODIUM 25 MG PO CAPS
100.0000 mg | ORAL_CAPSULE | Freq: Four times a day (QID) | ORAL | Status: DC
  Administered 2018-03-18 – 2018-03-21 (×8): 100 mg via ORAL
  Filled 2018-03-18 (×8): qty 4

## 2018-03-18 MED ORDER — SULINDAC 150 MG PO TABS
150.0000 mg | ORAL_TABLET | Freq: Two times a day (BID) | ORAL | Status: DC
  Filled 2018-03-18: qty 1

## 2018-03-18 MED ORDER — POLYETHYLENE GLYCOL 3350 17 G PO PACK
17.0000 g | PACK | Freq: Every day | ORAL | Status: DC
  Administered 2018-03-19 – 2018-03-21 (×3): 17 g via ORAL
  Filled 2018-03-18 (×4): qty 1

## 2018-03-18 MED ORDER — HYDROMORPHONE HCL 1 MG/ML IJ SOLN
2.0000 mg | Freq: Once | INTRAMUSCULAR | Status: AC
  Administered 2018-03-18: 2 mg via INTRAVENOUS
  Filled 2018-03-18: qty 2

## 2018-03-18 MED ORDER — NORTRIPTYLINE HCL 25 MG PO CAPS
25.0000 mg | ORAL_CAPSULE | Freq: Every day | ORAL | Status: DC
  Administered 2018-03-18 – 2018-03-20 (×3): 25 mg via ORAL
  Filled 2018-03-18 (×4): qty 1

## 2018-03-18 MED ORDER — SODIUM CHLORIDE 0.9 % IV SOLN: 250.0000 mL | INTRAVENOUS | Status: DC | PRN

## 2018-03-18 MED ORDER — CITALOPRAM HYDROBROMIDE 20 MG PO TABS
20.0000 mg | ORAL_TABLET | Freq: Every day | ORAL | Status: DC
  Administered 2018-03-18 – 2018-03-20 (×3): 20 mg via ORAL
  Filled 2018-03-18 (×3): qty 1

## 2018-03-18 MED ORDER — B COMPLEX-C PO TABS
1.0000 | ORAL_TABLET | Freq: Every day | ORAL | Status: DC
  Administered 2018-03-19 – 2018-03-21 (×3): 1 via ORAL
  Filled 2018-03-18 (×3): qty 1

## 2018-03-18 MED ORDER — HYDROCODONE-ACETAMINOPHEN 5-325 MG PO TABS: 1.0000 | ORAL_TABLET | ORAL | Status: DC | PRN

## 2018-03-18 MED ORDER — PREDNISONE 20 MG PO TABS: 20.0000 mg | ORAL_TABLET | Freq: Every day | ORAL | Status: DC

## 2018-03-18 MED ORDER — DANTROLENE SODIUM 25 MG PO CAPS
100.0000 mg | ORAL_CAPSULE | Freq: Four times a day (QID) | ORAL | Status: DC
  Administered 2018-03-18: 18:00:00 100 mg via ORAL
  Filled 2018-03-18: qty 4

## 2018-03-18 MED ORDER — ONDANSETRON HCL 4 MG PO TABS: 4.0000 mg | ORAL_TABLET | Freq: Four times a day (QID) | ORAL | Status: DC | PRN

## 2018-03-18 MED ORDER — ONDANSETRON HCL 4 MG/2ML IJ SOLN
4.0000 mg | Freq: Once | INTRAMUSCULAR | Status: AC
  Administered 2018-03-18: 4 mg via INTRAVENOUS
  Filled 2018-03-18: qty 2

## 2018-03-18 MED ORDER — HYDROMORPHONE BOLUS VIA INFUSION: 0.5000 mg | INTRAVENOUS | Status: DC | PRN

## 2018-03-18 MED ORDER — PREGABALIN 75 MG PO CAPS
150.0000 mg | ORAL_CAPSULE | Freq: Two times a day (BID) | ORAL | Status: DC
  Administered 2018-03-18 – 2018-03-21 (×6): 150 mg via ORAL
  Filled 2018-03-18 (×7): qty 2

## 2018-03-18 MED ORDER — BACLOFEN 10 MG PO TABS
20.0000 mg | ORAL_TABLET | Freq: Four times a day (QID) | ORAL | Status: DC
  Administered 2018-03-18 – 2018-03-21 (×11): 20 mg via ORAL
  Filled 2018-03-18 (×13): qty 2

## 2018-03-18 MED ORDER — ONDANSETRON HCL 4 MG/2ML IJ SOLN: 4.0000 mg | Freq: Four times a day (QID) | INTRAMUSCULAR | Status: DC | PRN

## 2018-03-18 MED ORDER — MIRABEGRON ER 50 MG PO TB24
50.0000 mg | ORAL_TABLET | Freq: Every day | ORAL | Status: DC
  Administered 2018-03-19 – 2018-03-21 (×3): 50 mg via ORAL
  Filled 2018-03-18 (×3): qty 1

## 2018-03-18 MED ORDER — SODIUM CHLORIDE 0.9% FLUSH
3.0000 mL | Freq: Two times a day (BID) | INTRAVENOUS | Status: DC
  Administered 2018-03-18 – 2018-03-21 (×6): 3 mL via INTRAVENOUS

## 2018-03-18 MED ORDER — BACLOFEN 10 MG PO TABS
20.0000 mg | ORAL_TABLET | Freq: Four times a day (QID) | ORAL | Status: DC
  Administered 2018-03-18: 18:00:00 20 mg via ORAL
  Filled 2018-03-18 (×2): qty 2

## 2018-03-18 MED ORDER — DARIFENACIN HYDROBROMIDE ER 15 MG PO TB24
15.0000 mg | ORAL_TABLET | Freq: Every day | ORAL | Status: DC
  Filled 2018-03-18 (×3): qty 1

## 2018-03-18 MED ORDER — CLONAZEPAM 0.5 MG PO TABS
1.0000 mg | ORAL_TABLET | Freq: Three times a day (TID) | ORAL | Status: DC
  Administered 2018-03-18 – 2018-03-21 (×9): 1 mg via ORAL
  Filled 2018-03-18 (×9): qty 2

## 2018-03-18 MED ORDER — URELLE 81 MG PO TABS
1.0000 | ORAL_TABLET | Freq: Three times a day (TID) | ORAL | Status: DC | PRN
  Administered 2018-03-19: 81 mg via ORAL
  Filled 2018-03-18 (×2): qty 1

## 2018-03-18 MED ORDER — HYDROMORPHONE HCL 1 MG/ML IJ SOLN: 1.0000 mg | Freq: Once | INTRAMUSCULAR | Status: DC

## 2018-03-18 MED ORDER — KETOROLAC TROMETHAMINE 30 MG/ML IJ SOLN
30.0000 mg | Freq: Four times a day (QID) | INTRAMUSCULAR | Status: DC | PRN
  Administered 2018-03-18: 30 mg via INTRAVENOUS
  Filled 2018-03-18 (×2): qty 1

## 2018-03-18 MED ORDER — ALBUTEROL SULFATE (2.5 MG/3ML) 0.083% IN NEBU: 2.5000 mg | INHALATION_SOLUTION | RESPIRATORY_TRACT | Status: DC | PRN

## 2018-03-18 MED ORDER — PREDNISONE 10 MG PO TABS: 10.0000 mg | ORAL_TABLET | ORAL | Status: DC

## 2018-03-18 MED ORDER — SODIUM CHLORIDE 0.9% FLUSH
3.0000 mL | INTRAVENOUS | Status: DC | PRN
  Administered 2018-03-20 (×2): 3 mL via INTRAVENOUS
  Filled 2018-03-18 (×2): qty 3

## 2018-03-18 MED ORDER — PROMETHAZINE HCL 25 MG PO TABS
25.0000 mg | ORAL_TABLET | Freq: Four times a day (QID) | ORAL | Status: DC | PRN
  Administered 2018-03-19: 11:00:00 25 mg via ORAL
  Filled 2018-03-18 (×2): qty 1

## 2018-03-18 MED ORDER — SIMETHICONE 80 MG PO CHEW
80.0000 mg | CHEWABLE_TABLET | Freq: Three times a day (TID) | ORAL | Status: DC
  Administered 2018-03-18 – 2018-03-21 (×10): 80 mg via ORAL
  Filled 2018-03-18 (×14): qty 1

## 2018-03-18 MED ORDER — ACETAMINOPHEN 325 MG PO TABS: 650.0000 mg | ORAL_TABLET | Freq: Four times a day (QID) | ORAL | Status: DC | PRN

## 2018-03-18 MED ORDER — BISACODYL 5 MG PO TBEC
5.0000 mg | DELAYED_RELEASE_TABLET | Freq: Every day | ORAL | Status: DC | PRN
  Filled 2018-03-18: qty 1

## 2018-03-18 MED ORDER — ENOXAPARIN SODIUM 40 MG/0.4ML ~~LOC~~ SOLN
40.0000 mg | SUBCUTANEOUS | Status: DC
  Administered 2018-03-20: 16:00:00 40 mg via SUBCUTANEOUS
  Filled 2018-03-18: qty 0.4

## 2018-03-18 NOTE — ED Notes (Signed)
Placed on 2L nasal cannula after desat post narcotic admin

## 2018-03-18 NOTE — ED Notes (Signed)
Bedside to discharge pt, pt states she "wants to spend the night, I just got her on an ambulance." Dr. Mayford Knife aware. Pt resting in bed family bedside NAD.

## 2018-03-18 NOTE — ED Notes (Signed)
Report to Tiffany, RN 

## 2018-03-18 NOTE — ED Provider Notes (Signed)
Patient states her pain is still severe, family is requesting hospital observation for pain control.  She reportedly has scheduled surgery on Tuesday to try and alleviate this symptom.  I will discuss with the hospitalist for observation at this time.   Emily Filbert, MD 03/18/18 1346

## 2018-03-18 NOTE — ED Provider Notes (Signed)
Patient was still having severe pain, have ordered IV Dilaudid and Ativan additionally for pain and relaxation.  This appears to be acute on chronic pain and I am not sure how best to resolve it.   Emily Filbert, MD 03/18/18 1036

## 2018-03-18 NOTE — Progress Notes (Signed)
LCSW spoke to patient and her husband and explained Dr Mayford Knife will bring her into observation to assist with pain managment. Both patient and husband were agreeable with this plan. Patient was asking to take her own medications and this worker explained I would bring in nurse and EDP to discuss her medication needs and advised her not to take her home meds   LCSW brought in coverage ED RN and Hospitalist to discuss medication concerns  No further needs  Jasmine Hustead LCSW

## 2018-03-18 NOTE — ED Notes (Signed)
ED social worker to nurses station stating that pt had her home medications in the room and was wanting to take them. This RN went into room and inquired about what home medications that the patient was wanting to take. Husband stated that it was her "regular home medication". This RN informed pt and her husband not to take home medications because Dr. Imogene Burn was admitting the pt and he would put orders in for medication and the pharmacy would send them to the floor, this way we can prevent double medicating the patient. Dr. Imogene Burn present at bedside who agreed that pt should not self medicate but wait for pharmacy to send her medications and for them to be administered by hospital staff.
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
## 2018-03-18 NOTE — ED Triage Notes (Signed)
Pt c/o lower back that radiates into buttock. Pt stated that she sat in her wheelchair for two days straight and now is having this pain for the past 2 days. Pt stated that she took her pain medicine about 0430
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
## 2018-03-18 NOTE — Progress Notes (Signed)
LCSW met with patient/family as requested. LCSW listened to patients and husband concern. She would like to remain in observation and continue with the pain management regime. LCSW offered to have a care management consult and patient and husband refused to have home health support. LCSW offered to provide in home private support list, this option was denied as husband reported his employer pays for housekeeping weekly.  LCSW asked if family was connected to their church and could possible request additional in home visits and support from their church members. LCSW inquired about transportation and patient and family stated they could not put her in wheel chair. LCSW countered with we could arrange transportation by EMS. LCSW agreed to consult and advocate on the patients behalf. Patients son arrived presenting a medicine bottle to his father. It was explained that patient needs to clear any in home medications with her nurse/doctor.  Upon returning with Dr Jimmye Norman he will seek the counsel of our hospitalist .  Patient presented that if she isn't deemed appropriate to stay in Observation t hey will now go home with her husband and their Lucianne Lei. EMS ride is too bumpy as per patient. Several ideas were reviewed. LCSW informed patient we would like to see her comfortable and EMS is still an option. LCSW asked if there was anything else I could get for her she requested a lunch box. LCSW provided this immediately and with a cranberry juice.  Awaiting on decision. No further SW needs  [employer] LCSW [phone]

## 2018-03-19 LAB — URINE CULTURE: CULTURE: NO GROWTH

## 2018-03-19 MED ORDER — DARIFENACIN HYDROBROMIDE ER 7.5 MG PO TB24
15.0000 mg | ORAL_TABLET | Freq: Every day | ORAL | Status: DC
  Administered 2018-03-19 – 2018-03-21 (×3): 15 mg via ORAL
  Filled 2018-03-19 (×3): qty 2

## 2018-03-19 MED ORDER — MORPHINE SULFATE 15 MG PO TABS
15.0000 mg | ORAL_TABLET | Freq: Four times a day (QID) | ORAL | Status: DC | PRN
  Administered 2018-03-19 – 2018-03-21 (×5): 15 mg via ORAL
  Filled 2018-03-19 (×6): qty 1

## 2018-03-19 MED ORDER — BISACODYL 10 MG RE SUPP
10.0000 mg | Freq: Every day | RECTAL | Status: DC | PRN
  Filled 2018-03-19 (×2): qty 1

## 2018-03-19 MED ORDER — LACTULOSE 10 GM/15ML PO SOLN
30.0000 g | Freq: Every day | ORAL | Status: DC | PRN
  Filled 2018-03-19 (×2): qty 60

## 2018-03-19 MED ORDER — MAGNESIUM SULFATE 2 GM/50ML IV SOLN
2.0000 g | Freq: Once | INTRAVENOUS | Status: AC
  Administered 2018-03-19: 13:00:00 2 g via INTRAVENOUS
  Filled 2018-03-19: qty 50

## 2018-03-19 MED ORDER — DOXYCYCLINE HYCLATE 100 MG PO TABS
100.0000 mg | ORAL_TABLET | Freq: Two times a day (BID) | ORAL | Status: DC
  Administered 2018-03-19 – 2018-03-21 (×5): 100 mg via ORAL
  Filled 2018-03-19 (×6): qty 1

## 2018-03-19 MED ORDER — METHYLPREDNISOLONE SODIUM SUCC 40 MG IJ SOLR
40.0000 mg | Freq: Three times a day (TID) | INTRAMUSCULAR | Status: DC
  Administered 2018-03-19 – 2018-03-21 (×7): 40 mg via INTRAVENOUS
  Filled 2018-03-19 (×7): qty 1

## 2018-03-19 NOTE — Progress Notes (Signed)
PT Cancellation Note  Patient Details Name: Jasmine [name] MRN: [id number] DOB: [birth year]   Cancelled Treatment:    Reason Eval/Treat Not Completed: PT screened, no needs identified, will sign off;Other (comment).  Pt is baseline non ambulatory and paraplegic, baseline self care skills.  Spoke with MD and nursing with agreement of needs of pt not requiring PT.   Ivar Drape 03/19/2018, 8:39 AM   Samul Dada, PT MS Acute Rehab Dept. Number: [facility] [id number] and [facility] [phone]

## 2018-03-19 NOTE — Progress Notes (Signed)
Patient ID: Jasmine [name], female   DOB: 21-Apr-1976, 42 y.o.   MRN: [id number]  [employer] PROGRESS NOTE  Jasmine [name] [id number] DOB: [birth year] DOA: 03/18/2018 PCP: Leotis Shames, MD  HPI/Subjective: Patient having some severe pain with movement in her lower back.  She thinks that the spasms have something to do with it.  Her primary care physician put her on prednisone which has not helped yet.  Patient is supposed to have a baclofen pump placed in a few days at [facility].  Objective: Vitals:   03/18/18 1928 03/19/18 0354  BP: (!) 116/57 (!) 149/79  Pulse: 92 74  Resp: 15 18  Temp: 98.5 F (36.9 C) 97.8 F (36.6 C)  SpO2: 95% 93%    Filed Weights   03/18/18 0537 03/18/18 1602  Weight: 77.1 kg 74.1 kg    ROS: Review of Systems  Constitutional: Negative for chills and fever.  Eyes: Negative for blurred vision.  Respiratory: Negative for cough and shortness of breath.   Cardiovascular: Negative for chest pain.  Gastrointestinal: Positive for constipation. Negative for abdominal pain, diarrhea, nausea and vomiting.  Genitourinary: Negative for dysuria.  Musculoskeletal: Positive for back pain. Negative for joint pain.  Neurological: Positive for headaches. Negative for dizziness.   Exam: Physical Exam  Constitutional: She is oriented to person, place, and time.  HENT:  Nose: No mucosal edema.  Mouth/Throat: No oropharyngeal exudate or posterior oropharyngeal edema.  Eyes: Pupils are equal, round, and reactive to light. Conjunctivae, EOM and lids are normal.  Neck: No JVD present. Carotid bruit is not present. No edema present. No thyroid mass and no thyromegaly present.  Cardiovascular: S1 normal and S2 normal. Exam reveals no gallop.  No murmur heard. Pulses:      Dorsalis pedis pulses are 2+ on the right side, and 2+ on the left side.  Respiratory: No respiratory distress. She has no wheezes. She has no rhonchi. She has no rales.  GI: Soft. Bowel sounds  are normal. There is no tenderness.  Musculoskeletal:       Right ankle: She exhibits no swelling.       Left ankle: She exhibits no swelling.  Lymphadenopathy:    She has no cervical adenopathy.  Neurological: She is alert and oriented to person, place, and time. No cranial nerve deficit.  Skin: Skin is warm. No rash noted. Nails show no clubbing.  Psychiatric: She has a normal mood and affect.        Recent Results (from the past 240 hour(s))  Urine culture     Status: None   Collection Time: 03/18/18  6:13 AM  Result Value Ref Range Status   Specimen Description   Final    URINE, RANDOM Performed at [facility], [address] [place], [place] [address]    Special Requests   Final    NONE Performed at [facility], [address] [place], [place] [address]    Culture   Final    NO GROWTH Performed at [facility], [address] [place], [place] [address]    Report Status 03/19/2018 FINAL  Final      Scheduled Meds: . B-complex with vitamin C  1 tablet Oral Daily  . baclofen  20 mg Oral QID  . citalopram  20 mg Oral QHS  . clonazePAM  1 mg Oral TID  . dantrolene  100 mg Oral QID  . darifenacin  15 mg Oral Daily  . enoxaparin (LOVENOX) injection  40 mg Subcutaneous Q24H  . methylPREDNISolone (SOLU-MEDROL) injection  40 mg Intravenous Q8H  . mirabegron ER  50 mg Oral Daily  . nortriptyline  25 mg Oral QHS  . Plecanatide  3 mg Oral Daily  . polyethylene glycol  17 g Oral Daily  . pregabalin  150 mg Oral BID  . simethicone  80 mg Oral TID PC & HS  . sodium chloride flush  3 mL Intravenous Q12H  . sulindac  150 mg Oral BID   Continuous Infusions: . sodium chloride    . magnesium sulfate 1 - 4 g bolus IVPB      Assessment/Plan:  1. Severe back pain with movement.  MRI of the sacrum showed some inflammation about the sacrum and coccyx.  Change prednisone over the Solu-Medrol.  Change her pain control back to MSIR short  acting. 2. Headache.  Toradol, magnesium and steroid should help 3. Paraplegia with lower extremity severe weakness.  Continue her baclofen.  Patient supposed to get a baclofen pump soon.  Continue Lyrica. 4. Constipation.  Lactulose as needed.  Change suppository 10 mg Dulcolax.  Code Status:     Code Status Orders  (From admission, onward)         Start     Ordered   03/18/18 1610  Full code  Continuous     03/18/18 1609        Code Status History    Date Active Date Inactive Code Status Order ID Comments User Context   10/18/2015 1856 11/15/2015 1358 Full Code [id number]  Lynnae Prude Inpatient   10/10/2015 2250 10/18/2015 1856 Full Code [id number]  Vernie Murders, RN Inpatient      Disposition Plan: Reevaluate tomorrow  Time spent: 28 minutes  Cire Clute [employer]

## 2018-03-20 LAB — BASIC METABOLIC PANEL
ANION GAP: 8 (ref 5–15)
BUN: 12 mg/dL (ref 6–20)
CALCIUM: 9.2 mg/dL (ref 8.9–10.3)
CO2: 27 mmol/L (ref 22–32)
Chloride: 105 mmol/L (ref 98–111)
Creatinine, Ser: 0.43 mg/dL — ABNORMAL LOW (ref 0.44–1.00)
GFR calc non Af Amer: >60 mL/min (ref >60)
GLUCOSE: 122 mg/dL — AB (ref 70–99)
POTASSIUM: 4.4 mmol/L (ref 3.5–5.1)
SODIUM: 140 mmol/L (ref 135–145)

## 2018-03-20 LAB — CBC
HEMATOCRIT: 40.4 % (ref 36.0–46.0)
Hemoglobin: 13.1 g/dL (ref 12.0–15.0)
MCH: 30.3 pg (ref 26.0–34.0)
MCHC: 32.4 g/dL (ref 30.0–36.0)
MCV: 93.5 fL (ref 80.0–100.0)
NRBC: 0 % (ref 0.0–0.2)
Platelets: 191 10*3/uL (ref 150–400)
RBC: 4.32 MIL/uL (ref 3.87–5.11)
RDW: 12.4 % (ref 11.5–15.5)
WBC: 12.1 10*3/uL — AB (ref 4.0–10.5)

## 2018-03-20 LAB — GLUCOSE, CAPILLARY: Glucose-Capillary: 128 mg/dL — ABNORMAL HIGH (ref 70–99)

## 2018-03-20 MED ORDER — MAGNESIUM SULFATE 2 GM/50ML IV SOLN
2.0000 g | Freq: Once | INTRAVENOUS | Status: AC
  Administered 2018-03-20: 11:00:00 2 g via INTRAVENOUS
  Filled 2018-03-20: qty 50

## 2018-03-20 MED ORDER — BUTALBITAL-APAP-CAFFEINE 50-325-40 MG PO TABS: 1.0000 | ORAL_TABLET | Freq: Four times a day (QID) | ORAL | Status: DC | PRN

## 2018-03-20 MED ORDER — LACTULOSE 10 GM/15ML PO SOLN
30.0000 g | Freq: Once | ORAL | Status: AC
  Administered 2018-03-20: 09:00:00 30 g via ORAL

## 2018-03-20 MED ORDER — SULINDAC 150 MG PO TABS
150.0000 mg | ORAL_TABLET | Freq: Two times a day (BID) | ORAL | Status: DC
  Filled 2018-03-20 (×5): qty 1

## 2018-03-20 NOTE — Progress Notes (Addendum)
Pt planning back surgery in couple of days. Quadaplegic. MSIR started with adequate pain control achieved. Now can tolerated semi fowler position and gentle turning. Required I&O caths x 2. Reports HA and "tail bone" pain improved. Pt has poor vein access with IV team having to restart IV 's x 2 this shift.

## 2018-03-20 NOTE — Progress Notes (Signed)
Patient ID: Jasmine [name], female   DOB: July 05, 1975, 42 y.o.   MRN: [id number]  [employer] PROGRESS NOTE  Jasmine [name] [id number] DOB: [birth year] DOA: 03/18/2018 PCP: Leotis Shames, MD  HPI/Subjective: Patient still not feeling well this morning when I saw her.  Complaining of a severe headache on the right side of her head.  Severe back pain with moving around and also with constipation.  She is scared about going home today.  Objective: Vitals:   03/20/18 0340 03/20/18 1320  BP: (!) 155/80 121/71  Pulse: 68 87  Resp: 16 18  Temp: 97.9 F (36.6 C) (!) 97.5 F (36.4 C)  SpO2: 96% 91%    Filed Weights   03/18/18 0537 03/18/18 1602  Weight: 77.1 kg 74.1 kg    ROS: Review of Systems  Constitutional: Negative for chills and fever.  Eyes: Negative for blurred vision.  Respiratory: Negative for cough and shortness of breath.   Cardiovascular: Negative for chest pain.  Gastrointestinal: Positive for constipation. Negative for abdominal pain, diarrhea, nausea and vomiting.  Genitourinary: Negative for dysuria.  Musculoskeletal: Positive for back pain. Negative for joint pain.  Neurological: Positive for headaches. Negative for dizziness.   Exam: Physical Exam  Constitutional: She is oriented to person, place, and time.  HENT:  Nose: No mucosal edema.  Mouth/Throat: No oropharyngeal exudate or posterior oropharyngeal edema.  Eyes: Pupils are equal, round, and reactive to light. Conjunctivae, EOM and lids are normal.  Neck: No JVD present. Carotid bruit is not present. No edema present. No thyroid mass and no thyromegaly present.  Cardiovascular: S1 normal and S2 normal. Exam reveals no gallop.  No murmur heard. Pulses:      Dorsalis pedis pulses are 2+ on the right side, and 2+ on the left side.  Respiratory: No respiratory distress. She has no wheezes. She has no rhonchi. She has no rales.  GI: Soft. Bowel sounds are normal. There is no tenderness.   Musculoskeletal:       Right ankle: She exhibits no swelling.       Left ankle: She exhibits no swelling.  Lymphadenopathy:    She has no cervical adenopathy.  Neurological: She is alert and oriented to person, place, and time. No cranial nerve deficit.  Skin: Skin is warm. No rash noted. Nails show no clubbing.  Psychiatric: She has a normal mood and affect.        Recent Results (from the past 240 hour(s))  Urine culture     Status: None   Collection Time: 03/18/18  6:13 AM  Result Value Ref Range Status   Specimen Description   Final    URINE, RANDOM Performed at [facility], [address], [place], [place] [address]    Special Requests   Final    NONE Performed at [facility], [address] [place], [place] [address]    Culture   Final    NO GROWTH Performed at [facility], [address], [place], [place] [address]    Report Status 03/19/2018 FINAL  Final      Scheduled Meds: . B-complex with vitamin C  1 tablet Oral Daily  . baclofen  20 mg Oral QID  . citalopram  20 mg Oral QHS  . clonazePAM  1 mg Oral TID  . dantrolene  100 mg Oral QID  . darifenacin  15 mg Oral Daily  . doxycycline  100 mg Oral BID  . enoxaparin (LOVENOX) injection  40 mg  Subcutaneous Q24H  . methylPREDNISolone (SOLU-MEDROL) injection  40 mg Intravenous Q8H  . mirabegron ER  50 mg Oral Daily  . nortriptyline  25 mg Oral QHS  . Plecanatide  3 mg Oral Daily  . polyethylene glycol  17 g Oral Daily  . pregabalin  150 mg Oral BID  . simethicone  80 mg Oral TID PC & HS  . sodium chloride flush  3 mL Intravenous Q12H  . sulindac  150 mg Oral BID WC   Continuous Infusions: . sodium chloride      Assessment/Plan:  1. Severe back pain with movement.  MRI of the sacrum showed some inflammation about the sacrum and coccyx.  Continue Solu-Medrol.  Change her pain control back to MSIR short acting. 2. Headache.  Give another dose of magnesium.  PRN Fioricet.   Steroid should also help.  Patient refused any migraine medication. 3. Paraplegia with lower extremity severe weakness.  Continue her baclofen.  Patient supposed to get a baclofen pump soon.  Continue Lyrica. 4. Constipation.  Lactulose ordered.  Change suppository 10 mg Dulcolax.  Code Status:     Code Status Orders  (From admission, onward)         Start     Ordered   03/18/18 1610  Full code  Continuous     03/18/18 1609        Code Status History    Date Active Date Inactive Code Status Order ID Comments User Context   10/18/2015 1856 11/15/2015 1358 Full Code [id number]  Lynnae Prude Inpatient   10/10/2015 2250 10/18/2015 1856 Full Code [id number]  Vernie Murders, RN Inpatient      Disposition Plan: Hopefully home tomorrow  Time spent: 26 minutes  Bradden Tadros [employer]

## 2018-03-21 MED ORDER — LACTULOSE 10 GM/15ML PO SOLN: 30.0000 g | Freq: Every day | ORAL | 0 refills | Status: DC | PRN

## 2018-03-21 MED ORDER — PREDNISONE 10 MG PO TABS: ORAL_TABLET | ORAL | 0 refills | Status: DC
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
## 2018-03-21 NOTE — Plan of Care (Signed)
Pt continued to need pain meds for lower back and coccyx area pain.

## 2018-03-21 NOTE — Progress Notes (Signed)
Pt is d/ced to [facility].  Being transferred for baclofen pump placement tomorrow with Dr. Claudette Laws.  Called report to [place] at [phone].  Pt's room is 8W21.  She is being sent via [facility] called report to [phone].  Pt is leaving w/IV intact per Kendal Hymen, Charity fundraiser.  Pt has braces on legs to prevent foot drop - paraplegic.  In/out cath removed 200 cc urine.

## 2018-03-22 LAB — HIV ANTIBODY (ROUTINE TESTING W REFLEX): HIV Screen 4th Generation wRfx: NONREACTIVE

## 2018-04-11 ENCOUNTER — Encounter: Payer: Managed Care, Other (non HMO) | Admitting: Physical Medicine & Rehabilitation

## 2018-04-13 ENCOUNTER — Encounter: Payer: Managed Care, Other (non HMO) | Admitting: Physical Medicine & Rehabilitation

## 2018-04-20 ENCOUNTER — Other Ambulatory Visit: Payer: Self-pay

## 2018-04-20 DIAGNOSIS — G8250 Quadriplegia, unspecified: Secondary | ICD-10-CM

## 2018-04-20 DIAGNOSIS — M5416 Radiculopathy, lumbar region: Secondary | ICD-10-CM

## 2018-04-20 DIAGNOSIS — M4722 Other spondylosis with radiculopathy, cervical region: Secondary | ICD-10-CM

## 2018-04-20 DIAGNOSIS — S14105S Unspecified injury at C5 level of cervical spinal cord, sequela: Secondary | ICD-10-CM

## 2018-04-20 DIAGNOSIS — K592 Neurogenic bowel, not elsewhere classified: Secondary | ICD-10-CM

## 2018-04-21 NOTE — Telephone Encounter (Signed)
Electronic request for morphine refill

## 2018-04-22 ENCOUNTER — Other Ambulatory Visit: Payer: Self-pay

## 2018-04-22 DIAGNOSIS — S14105S Unspecified injury at C5 level of cervical spinal cord, sequela: Secondary | ICD-10-CM

## 2018-04-22 DIAGNOSIS — K592 Neurogenic bowel, not elsewhere classified: Secondary | ICD-10-CM

## 2018-04-22 DIAGNOSIS — G8250 Quadriplegia, unspecified: Secondary | ICD-10-CM

## 2018-04-22 DIAGNOSIS — M4722 Other spondylosis with radiculopathy, cervical region: Secondary | ICD-10-CM

## 2018-04-22 DIAGNOSIS — M5416 Radiculopathy, lumbar region: Secondary | ICD-10-CM

## 2018-04-22 NOTE — Telephone Encounter (Signed)
Recieved electronic medication refill request for morphine 15mg  tablets.  Patient not seen in clinic since 03-01-2018 due to hospitalization and Flu.   Next appointment made for 05-25-2018  According to last PMP:  Fill Date                             Written                             Drug                                                  Qty                             prescriber 04/14/2018                        03/24/2018                        Morphine Sulfate Ir 15 Mg              42.00                             Kr Ei

## 2018-04-25 ENCOUNTER — Telehealth: Payer: Self-pay | Admitting: *Deleted

## 2018-04-25 DIAGNOSIS — M4722 Other spondylosis with radiculopathy, cervical region: Secondary | ICD-10-CM

## 2018-04-25 DIAGNOSIS — S14105S Unspecified injury at C5 level of cervical spinal cord, sequela: Secondary | ICD-10-CM

## 2018-04-25 DIAGNOSIS — K592 Neurogenic bowel, not elsewhere classified: Secondary | ICD-10-CM

## 2018-04-25 DIAGNOSIS — M5416 Radiculopathy, lumbar region: Secondary | ICD-10-CM

## 2018-04-25 DIAGNOSIS — G8250 Quadriplegia, unspecified: Secondary | ICD-10-CM

## 2018-04-25 MED ORDER — MORPHINE SULFATE 15 MG PO TABS: 15.0000 mg | ORAL_TABLET | Freq: Four times a day (QID) | ORAL | 0 refills | Status: DC | PRN

## 2018-04-25 NOTE — Telephone Encounter (Signed)
PMP Aware was reviewed, last prescription was filled on 04/14/2018 by Burna CashKristina Eibacher PA-C. Last prescription e-scribe by Dr. Riley KillSwartz was on 03/16/2018. Spoke with Mr. Jasmine [name] today and apologized for the delay, he verbalizes understanding. He is aware the prescription was sent, and verbalizes understanding.

## 2018-04-25 NOTE — Telephone Encounter (Signed)
Patient contacted our office today asking for Morphine refill.  Initial request sent November 13th, 2019

## 2018-04-25 NOTE — Telephone Encounter (Signed)
done

## 2018-04-30 ENCOUNTER — Other Ambulatory Visit: Payer: Self-pay | Admitting: Physical Medicine & Rehabilitation

## 2018-05-11 ENCOUNTER — Other Ambulatory Visit: Payer: Self-pay | Admitting: Physical Medicine & Rehabilitation

## 2018-05-11 DIAGNOSIS — G8250 Quadriplegia, unspecified: Secondary | ICD-10-CM

## 2018-05-11 DIAGNOSIS — S14105S Unspecified injury at C5 level of cervical spinal cord, sequela: Secondary | ICD-10-CM

## 2018-05-23 ENCOUNTER — Other Ambulatory Visit: Payer: Self-pay | Admitting: Physical Medicine & Rehabilitation

## 2018-05-24 ENCOUNTER — Other Ambulatory Visit: Payer: Self-pay

## 2018-05-24 ENCOUNTER — Telehealth: Payer: Self-pay

## 2018-05-24 DIAGNOSIS — G8250 Quadriplegia, unspecified: Secondary | ICD-10-CM

## 2018-05-24 DIAGNOSIS — M5416 Radiculopathy, lumbar region: Secondary | ICD-10-CM

## 2018-05-24 DIAGNOSIS — S14105S Unspecified injury at C5 level of cervical spinal cord, sequela: Secondary | ICD-10-CM

## 2018-05-24 DIAGNOSIS — M4722 Other spondylosis with radiculopathy, cervical region: Secondary | ICD-10-CM

## 2018-05-24 DIAGNOSIS — K592 Neurogenic bowel, not elsewhere classified: Secondary | ICD-10-CM

## 2018-05-24 MED ORDER — MORPHINE SULFATE 15 MG PO TABS: 15.0000 mg | ORAL_TABLET | Freq: Four times a day (QID) | ORAL | 0 refills | Status: DC | PRN

## 2018-05-24 NOTE — Telephone Encounter (Signed)
Patient notified. Patient educated that Dr. Riley KillSwartz will not continue to supply scripts without appointments.

## 2018-05-24 NOTE — Telephone Encounter (Signed)
Jasmine [name] called for patient stating she is not feeling well and had to reschedule her appt and she needs a refill on Morphine. Last filled Morphine Sulfate IR #120 04/25/18. Next appt 06/27/2018

## 2018-05-24 NOTE — Telephone Encounter (Signed)
Electronic refill for morphine. Patient has Dysport scheduled next month on 06/27/2018.  Patient has not been seen in our office since 03/01/2018.  Patient cancelled tomorrows visit because she is not feeling well.   Please advise

## 2018-05-24 NOTE — Telephone Encounter (Signed)
We will not do this again

## 2018-05-25 ENCOUNTER — Encounter: Payer: Managed Care, Other (non HMO) | Admitting: Physical Medicine & Rehabilitation

## 2018-06-07 ENCOUNTER — Encounter
Payer: Managed Care, Other (non HMO) | Attending: Physical Medicine & Rehabilitation | Admitting: Physical Medicine & Rehabilitation

## 2018-06-07 ENCOUNTER — Encounter: Payer: Self-pay | Admitting: Physical Medicine & Rehabilitation

## 2018-06-07 DIAGNOSIS — G8250 Quadriplegia, unspecified: Secondary | ICD-10-CM | POA: Insufficient documentation

## 2018-06-07 DIAGNOSIS — M5416 Radiculopathy, lumbar region: Secondary | ICD-10-CM | POA: Diagnosis not present

## 2018-06-07 DIAGNOSIS — S14105S Unspecified injury at C5 level of cervical spinal cord, sequela: Secondary | ICD-10-CM | POA: Insufficient documentation

## 2018-06-07 DIAGNOSIS — M4722 Other spondylosis with radiculopathy, cervical region: Secondary | ICD-10-CM | POA: Diagnosis not present

## 2018-06-07 DIAGNOSIS — K592 Neurogenic bowel, not elsewhere classified: Secondary | ICD-10-CM | POA: Diagnosis not present

## 2018-06-07 MED ORDER — MORPHINE SULFATE 15 MG PO TABS: 15.0000 mg | ORAL_TABLET | Freq: Four times a day (QID) | ORAL | 0 refills | Status: DC | PRN

## 2018-06-07 NOTE — Patient Instructions (Signed)
WORK ON REGULAR REPOSITIONING AND STRETCHING OF YOUR HAMSTRINGS AND GLUTEAL MUSCLES

## 2018-06-07 NOTE — Progress Notes (Addendum)
Dysport Injection for spasticity using needle EMG guidance Indication:  SPASTIC TETRAPLEGIA   Dilution: 500 Units/2.715ml        Total Units Injected:  1000 Indication: Severe spasticity which interferes with ADL,mobility and/or  hygiene and is unresponsive to medication management and other conservative care Informed consent was obtained after describing risks and benefits of the procedure with the patient. This includes bleeding, bruising, infection, excessive weakness, or medication side effects. A REMS form is on file and signed.  Needle: 50mm injectable monopolar needle electrode  Number of units per muscle Pectoralis Major 0 units Pectoralis Minor 0 units Biceps 0 units Brachioradialis 0 units FCR 25 units RIGHT, 25 U LEFT FCU 25 unit right,  25 u left FDS 200 units right, 150u left FDP 200 units right, 150u left Lumbricals right 200u over four muscles Pronator Teres 0 units Pronator Quadratus 0 units  All injections were done after obtaining appropriate EMG activity and after negative drawback for blood. The patient tolerated the procedure well. Post procedure instructions were given.  An rx for Nalfon capsules was written today. [name] has previously tried ibuprofen, naproxen, diclofenac, meloxicam, sulindac amongst others prior.

## 2018-06-10 MED ORDER — FENOPROFEN CALCIUM 200 MG PO CAPS: 200.0000 mg | ORAL_CAPSULE | Freq: Three times a day (TID) | ORAL | 3 refills | Status: DC

## 2018-06-16 ENCOUNTER — Telehealth: Payer: Self-pay | Admitting: *Deleted

## 2018-06-16 NOTE — Telephone Encounter (Signed)
Prior authorization for fenoprofen. We need documented proof of inadequate response/failure/contraindication of 4 previously tried NSAID's. This can be accomplished by addending last clinic note and submitting an appeal to [place].

## 2018-06-17 NOTE — Telephone Encounter (Signed)
And you were kind enough to provide me the date of her last visit weren't you?  :)  Documentation change made

## 2018-06-22 ENCOUNTER — Other Ambulatory Visit: Payer: Self-pay

## 2018-06-22 DIAGNOSIS — K592 Neurogenic bowel, not elsewhere classified: Secondary | ICD-10-CM

## 2018-06-22 DIAGNOSIS — M5416 Radiculopathy, lumbar region: Secondary | ICD-10-CM

## 2018-06-22 DIAGNOSIS — G8250 Quadriplegia, unspecified: Secondary | ICD-10-CM

## 2018-06-22 DIAGNOSIS — S14105S Unspecified injury at C5 level of cervical spinal cord, sequela: Secondary | ICD-10-CM

## 2018-06-22 DIAGNOSIS — M4722 Other spondylosis with radiculopathy, cervical region: Secondary | ICD-10-CM

## 2018-06-27 ENCOUNTER — Ambulatory Visit: Payer: Managed Care, Other (non HMO) | Admitting: Physical Medicine & Rehabilitation

## 2018-06-27 NOTE — Telephone Encounter (Signed)
Appeal letter submitted.

## 2018-07-11 ENCOUNTER — Other Ambulatory Visit: Payer: Self-pay | Admitting: Physical Medicine & Rehabilitation

## 2018-07-11 DIAGNOSIS — S14105S Unspecified injury at C5 level of cervical spinal cord, sequela: Secondary | ICD-10-CM

## 2018-07-11 DIAGNOSIS — G8250 Quadriplegia, unspecified: Secondary | ICD-10-CM

## 2018-07-11 NOTE — Telephone Encounter (Signed)
Requesting refill Clonazepam, last filled 05-11-18.

## 2018-07-22 ENCOUNTER — Other Ambulatory Visit: Payer: Self-pay | Admitting: *Deleted

## 2018-07-22 DIAGNOSIS — S14105S Unspecified injury at C5 level of cervical spinal cord, sequela: Secondary | ICD-10-CM

## 2018-07-22 DIAGNOSIS — G8250 Quadriplegia, unspecified: Secondary | ICD-10-CM

## 2018-07-22 MED ORDER — CLONAZEPAM 1 MG PO TABS: 1.0000 mg | ORAL_TABLET | Freq: Three times a day (TID) | ORAL | 1 refills | Status: DC

## 2018-07-23 ENCOUNTER — Other Ambulatory Visit: Payer: Self-pay | Admitting: Physical Medicine & Rehabilitation

## 2018-07-23 ENCOUNTER — Other Ambulatory Visit: Payer: Self-pay

## 2018-07-23 DIAGNOSIS — G8250 Quadriplegia, unspecified: Secondary | ICD-10-CM

## 2018-07-23 DIAGNOSIS — S14105S Unspecified injury at C5 level of cervical spinal cord, sequela: Secondary | ICD-10-CM

## 2018-07-23 DIAGNOSIS — G8254 Quadriplegia, C5-C7 incomplete: Secondary | ICD-10-CM

## 2018-07-23 DIAGNOSIS — M5416 Radiculopathy, lumbar region: Secondary | ICD-10-CM

## 2018-07-23 DIAGNOSIS — K592 Neurogenic bowel, not elsewhere classified: Secondary | ICD-10-CM

## 2018-07-23 DIAGNOSIS — M4722 Other spondylosis with radiculopathy, cervical region: Secondary | ICD-10-CM

## 2018-07-25 MED ORDER — MORPHINE SULFATE 15 MG PO TABS: 15.0000 mg | ORAL_TABLET | Freq: Four times a day (QID) | ORAL | 0 refills | Status: DC | PRN

## 2018-07-25 NOTE — Telephone Encounter (Signed)
Med refilled.

## 2018-08-08 ENCOUNTER — Ambulatory Visit: Payer: Managed Care, Other (non HMO) | Admitting: Physical Medicine & Rehabilitation

## 2018-08-10 ENCOUNTER — Encounter
Payer: Managed Care, Other (non HMO) | Attending: Physical Medicine & Rehabilitation | Admitting: Physical Medicine & Rehabilitation

## 2018-08-10 ENCOUNTER — Other Ambulatory Visit: Payer: Self-pay

## 2018-08-10 ENCOUNTER — Encounter: Payer: Self-pay | Admitting: Physical Medicine & Rehabilitation

## 2018-08-10 VITALS — BP 117/78 | HR 86 | Wt 165.0 lb

## 2018-08-10 DIAGNOSIS — G894 Chronic pain syndrome: Secondary | ICD-10-CM | POA: Diagnosis not present

## 2018-08-10 DIAGNOSIS — K592 Neurogenic bowel, not elsewhere classified: Secondary | ICD-10-CM | POA: Insufficient documentation

## 2018-08-10 DIAGNOSIS — Z5181 Encounter for therapeutic drug level monitoring: Secondary | ICD-10-CM | POA: Diagnosis not present

## 2018-08-10 DIAGNOSIS — G8250 Quadriplegia, unspecified: Secondary | ICD-10-CM | POA: Insufficient documentation

## 2018-08-10 DIAGNOSIS — Z79891 Long term (current) use of opiate analgesic: Secondary | ICD-10-CM | POA: Diagnosis not present

## 2018-08-10 DIAGNOSIS — M5416 Radiculopathy, lumbar region: Secondary | ICD-10-CM

## 2018-08-10 DIAGNOSIS — S14105S Unspecified injury at C5 level of cervical spinal cord, sequela: Secondary | ICD-10-CM | POA: Insufficient documentation

## 2018-08-10 DIAGNOSIS — M4722 Other spondylosis with radiculopathy, cervical region: Secondary | ICD-10-CM | POA: Insufficient documentation

## 2018-08-10 MED ORDER — MORPHINE SULFATE 15 MG PO TABS: 15.0000 mg | ORAL_TABLET | Freq: Four times a day (QID) | ORAL | 0 refills | Status: DC | PRN

## 2018-08-10 NOTE — Patient Instructions (Signed)
PLEASE FEEL FREE TO CALL OUR OFFICE WITH ANY PROBLEMS OR QUESTIONS ([phone])      
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
## 2018-08-15 ENCOUNTER — Telehealth: Payer: Self-pay | Admitting: *Deleted

## 2018-08-15 LAB — DRUG TOX MONITOR 1 W/CONF, ORAL FLD

## 2018-08-15 LAB — DRUG TOX ALC METAB W/CON, ORAL FLD: Alcohol Metabolite: NEGATIVE ng/mL (ref <25)

## 2018-08-15 NOTE — Telephone Encounter (Signed)
Oral swab drug screen was negative for medications.  Will need to repeat swab at next visit.

## 2018-09-18 ENCOUNTER — Other Ambulatory Visit: Payer: Self-pay | Admitting: Physical Medicine & Rehabilitation

## 2018-09-18 DIAGNOSIS — M4722 Other spondylosis with radiculopathy, cervical region: Secondary | ICD-10-CM

## 2018-09-18 DIAGNOSIS — G8250 Quadriplegia, unspecified: Secondary | ICD-10-CM

## 2018-09-18 DIAGNOSIS — S14105S Unspecified injury at C5 level of cervical spinal cord, sequela: Secondary | ICD-10-CM

## 2018-09-19 MED ORDER — PREGABALIN 150 MG PO CAPS: 150.0000 mg | ORAL_CAPSULE | Freq: Three times a day (TID) | ORAL | 4 refills | Status: DC

## 2018-09-28 ENCOUNTER — Other Ambulatory Visit: Payer: Self-pay | Admitting: Physical Medicine & Rehabilitation

## 2018-10-05 ENCOUNTER — Encounter
Payer: Managed Care, Other (non HMO) | Attending: Physical Medicine & Rehabilitation | Admitting: Physical Medicine & Rehabilitation

## 2018-10-05 ENCOUNTER — Encounter: Payer: Self-pay | Admitting: Physical Medicine & Rehabilitation

## 2018-10-05 ENCOUNTER — Other Ambulatory Visit: Payer: Self-pay

## 2018-10-05 DIAGNOSIS — M4722 Other spondylosis with radiculopathy, cervical region: Secondary | ICD-10-CM

## 2018-10-05 DIAGNOSIS — S14105S Unspecified injury at C5 level of cervical spinal cord, sequela: Secondary | ICD-10-CM | POA: Diagnosis not present

## 2018-10-05 DIAGNOSIS — K592 Neurogenic bowel, not elsewhere classified: Secondary | ICD-10-CM

## 2018-10-05 DIAGNOSIS — M4802 Spinal stenosis, cervical region: Secondary | ICD-10-CM | POA: Diagnosis not present

## 2018-10-05 DIAGNOSIS — Z5181 Encounter for therapeutic drug level monitoring: Secondary | ICD-10-CM | POA: Diagnosis not present

## 2018-10-05 DIAGNOSIS — G8250 Quadriplegia, unspecified: Secondary | ICD-10-CM

## 2018-10-05 DIAGNOSIS — M5416 Radiculopathy, lumbar region: Secondary | ICD-10-CM

## 2018-10-05 MED ORDER — MORPHINE SULFATE 15 MG PO TABS: 15.0000 mg | ORAL_TABLET | Freq: Four times a day (QID) | ORAL | 0 refills | Status: DC | PRN

## 2018-10-05 MED ORDER — BACLOFEN 10 MG PO TABS: 20.0000 mg | ORAL_TABLET | Freq: Three times a day (TID) | ORAL | 3 refills | Status: DC
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
## 2018-10-12 ENCOUNTER — Other Ambulatory Visit: Payer: Self-pay | Admitting: Physical Medicine & Rehabilitation

## 2018-10-12 DIAGNOSIS — G8250 Quadriplegia, unspecified: Secondary | ICD-10-CM

## 2018-10-12 DIAGNOSIS — Z5181 Encounter for therapeutic drug level monitoring: Secondary | ICD-10-CM

## 2018-10-12 DIAGNOSIS — S14105S Unspecified injury at C5 level of cervical spinal cord, sequela: Secondary | ICD-10-CM

## 2018-10-12 DIAGNOSIS — M5416 Radiculopathy, lumbar region: Secondary | ICD-10-CM

## 2018-10-12 DIAGNOSIS — M4722 Other spondylosis with radiculopathy, cervical region: Secondary | ICD-10-CM

## 2018-10-12 DIAGNOSIS — M4802 Spinal stenosis, cervical region: Secondary | ICD-10-CM

## 2018-11-04 ENCOUNTER — Other Ambulatory Visit: Payer: Self-pay | Admitting: Physical Medicine & Rehabilitation

## 2018-11-04 DIAGNOSIS — Z5181 Encounter for therapeutic drug level monitoring: Secondary | ICD-10-CM

## 2018-11-04 DIAGNOSIS — M5416 Radiculopathy, lumbar region: Secondary | ICD-10-CM

## 2018-11-04 DIAGNOSIS — M4722 Other spondylosis with radiculopathy, cervical region: Secondary | ICD-10-CM

## 2018-11-04 DIAGNOSIS — G8250 Quadriplegia, unspecified: Secondary | ICD-10-CM

## 2018-11-04 DIAGNOSIS — S14105S Unspecified injury at C5 level of cervical spinal cord, sequela: Secondary | ICD-10-CM

## 2018-11-04 DIAGNOSIS — M4802 Spinal stenosis, cervical region: Secondary | ICD-10-CM

## 2018-11-16 ENCOUNTER — Other Ambulatory Visit: Payer: Self-pay | Admitting: Physical Medicine & Rehabilitation

## 2018-11-16 DIAGNOSIS — S14105S Unspecified injury at C5 level of cervical spinal cord, sequela: Secondary | ICD-10-CM

## 2018-11-16 DIAGNOSIS — G8250 Quadriplegia, unspecified: Secondary | ICD-10-CM

## 2018-11-28 NOTE — Telephone Encounter (Signed)
Message from patient

## 2018-12-01 NOTE — Telephone Encounter (Signed)
Message from patient

## 2018-12-07 ENCOUNTER — Encounter: Payer: Self-pay | Admitting: Physical Medicine & Rehabilitation

## 2018-12-07 ENCOUNTER — Other Ambulatory Visit: Payer: Self-pay

## 2018-12-07 ENCOUNTER — Encounter
Payer: Managed Care, Other (non HMO) | Attending: Physical Medicine & Rehabilitation | Admitting: Physical Medicine & Rehabilitation

## 2018-12-07 VITALS — Ht 61.0 in | Wt 169.0 lb

## 2018-12-07 DIAGNOSIS — M5416 Radiculopathy, lumbar region: Secondary | ICD-10-CM | POA: Diagnosis not present

## 2018-12-07 DIAGNOSIS — S14105S Unspecified injury at C5 level of cervical spinal cord, sequela: Secondary | ICD-10-CM | POA: Diagnosis not present

## 2018-12-07 DIAGNOSIS — K592 Neurogenic bowel, not elsewhere classified: Secondary | ICD-10-CM | POA: Diagnosis not present

## 2018-12-07 DIAGNOSIS — M4722 Other spondylosis with radiculopathy, cervical region: Secondary | ICD-10-CM

## 2018-12-07 DIAGNOSIS — G8250 Quadriplegia, unspecified: Secondary | ICD-10-CM | POA: Diagnosis not present

## 2018-12-07 MED ORDER — MORPHINE SULFATE 15 MG PO TABS: 15.0000 mg | ORAL_TABLET | Freq: Two times a day (BID) | ORAL | 0 refills | Status: DC | PRN
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
## 2019-01-08 ENCOUNTER — Other Ambulatory Visit: Payer: Self-pay | Admitting: Physical Medicine & Rehabilitation

## 2019-01-17 ENCOUNTER — Other Ambulatory Visit (HOSPITAL_COMMUNITY): Payer: Self-pay | Admitting: Physical Medicine & Rehabilitation

## 2019-01-17 MED ORDER — PREGABALIN 150 MG PO CAPS: 150.0000 mg | ORAL_CAPSULE | Freq: Three times a day (TID) | ORAL | 4 refills | Status: DC

## 2019-01-17 NOTE — Telephone Encounter (Signed)
Message from patient

## 2019-01-17 NOTE — Progress Notes (Signed)
Refilled lyrica rx

## 2019-01-17 NOTE — Telephone Encounter (Signed)
Lyric refilled

## 2019-01-18 ENCOUNTER — Other Ambulatory Visit: Payer: Self-pay

## 2019-01-18 ENCOUNTER — Other Ambulatory Visit: Payer: Self-pay | Admitting: Physical Medicine & Rehabilitation

## 2019-01-18 DIAGNOSIS — G8250 Quadriplegia, unspecified: Secondary | ICD-10-CM

## 2019-01-18 DIAGNOSIS — S14105S Unspecified injury at C5 level of cervical spinal cord, sequela: Secondary | ICD-10-CM

## 2019-01-18 DIAGNOSIS — M4722 Other spondylosis with radiculopathy, cervical region: Secondary | ICD-10-CM

## 2019-01-18 DIAGNOSIS — M5416 Radiculopathy, lumbar region: Secondary | ICD-10-CM

## 2019-01-18 DIAGNOSIS — K592 Neurogenic bowel, not elsewhere classified: Secondary | ICD-10-CM

## 2019-01-18 MED ORDER — MORPHINE SULFATE 15 MG PO TABS: 15.0000 mg | ORAL_TABLET | Freq: Two times a day (BID) | ORAL | 0 refills | Status: DC | PRN

## 2019-01-18 NOTE — Telephone Encounter (Signed)
Please advise on refill request below.

## 2019-01-18 NOTE — Telephone Encounter (Signed)
New message from patient:

## 2019-01-18 NOTE — Telephone Encounter (Signed)
Med refilled.

## 2019-01-19 ENCOUNTER — Telehealth: Payer: Self-pay | Admitting: Registered Nurse

## 2019-01-19 MED ORDER — PREGABALIN 150 MG PO CAPS: 150.0000 mg | ORAL_CAPSULE | Freq: Three times a day (TID) | ORAL | 4 refills | Status: DC

## 2019-01-19 NOTE — Telephone Encounter (Signed)
Dr. Naaman Plummer increased Ms. Hue Lyrica to three times a day, his original order was on phone in, Lyrica e-scribed today. Marland Mcalpine CMA called Ms. Saefong regarding the above she verbalizes understanding.

## 2019-01-19 NOTE — Telephone Encounter (Signed)
Message from patient

## 2019-01-30 ENCOUNTER — Other Ambulatory Visit: Payer: Self-pay

## 2019-01-30 ENCOUNTER — Other Ambulatory Visit (HOSPITAL_COMMUNITY): Payer: Self-pay | Admitting: Physical Medicine & Rehabilitation

## 2019-01-30 ENCOUNTER — Telehealth: Payer: Self-pay

## 2019-01-30 DIAGNOSIS — Z0000 Encounter for general adult medical examination without abnormal findings: Secondary | ICD-10-CM

## 2019-01-30 DIAGNOSIS — Z20822 Contact with and (suspected) exposure to covid-19: Secondary | ICD-10-CM

## 2019-01-30 NOTE — Telephone Encounter (Signed)
Patient husband called stating he sent a message to Dr. Naaman Plummer and he wanted Zella Ball to look at the message. He states it is urgent.

## 2019-01-31 ENCOUNTER — Other Ambulatory Visit: Payer: Self-pay | Admitting: Physical Medicine & Rehabilitation

## 2019-01-31 LAB — NOVEL CORONAVIRUS, NAA: SARS-CoV-2, NAA: NOT DETECTED

## 2019-01-31 NOTE — Telephone Encounter (Signed)
Dr. Naaman Plummer responded to Mrs. Weisgerber message on 01/30/2019. Mrs. Blyth responded to his message as well.

## 2019-02-06 NOTE — Telephone Encounter (Signed)
Message from patients husband

## 2019-02-08 ENCOUNTER — Encounter: Payer: Managed Care, Other (non HMO) | Admitting: Physical Medicine & Rehabilitation

## 2019-02-21 ENCOUNTER — Telehealth: Payer: Self-pay

## 2019-02-21 NOTE — Telephone Encounter (Signed)
Dr. Lanetta Inch at [facility] in [place] [phone] called requesting information on pt Botox to accommodate her while she's there. He is asking for a call from the physician. I told him we need a medical release form and we can send him records. He insisted that he just speak with the physician although he took the fax number.

## 2019-02-22 NOTE — Telephone Encounter (Signed)
I called this number and left VM.

## 2019-03-01 ENCOUNTER — Other Ambulatory Visit: Payer: Self-pay | Admitting: Physical Medicine & Rehabilitation

## 2019-03-01 DIAGNOSIS — S14105S Unspecified injury at C5 level of cervical spinal cord, sequela: Secondary | ICD-10-CM

## 2019-03-01 DIAGNOSIS — G8250 Quadriplegia, unspecified: Secondary | ICD-10-CM

## 2019-03-07 ENCOUNTER — Other Ambulatory Visit: Payer: Self-pay | Admitting: Physical Medicine & Rehabilitation

## 2019-03-07 DIAGNOSIS — S14105S Unspecified injury at C5 level of cervical spinal cord, sequela: Secondary | ICD-10-CM

## 2019-03-07 DIAGNOSIS — M4722 Other spondylosis with radiculopathy, cervical region: Secondary | ICD-10-CM

## 2019-03-07 DIAGNOSIS — M5416 Radiculopathy, lumbar region: Secondary | ICD-10-CM

## 2019-03-07 DIAGNOSIS — G8250 Quadriplegia, unspecified: Secondary | ICD-10-CM

## 2019-03-07 DIAGNOSIS — M4802 Spinal stenosis, cervical region: Secondary | ICD-10-CM

## 2019-03-07 DIAGNOSIS — Z5181 Encounter for therapeutic drug level monitoring: Secondary | ICD-10-CM

## 2019-03-21 NOTE — Telephone Encounter (Signed)
Message from patient

## 2019-03-27 ENCOUNTER — Telehealth: Payer: Self-pay

## 2019-03-27 DIAGNOSIS — K592 Neurogenic bowel, not elsewhere classified: Secondary | ICD-10-CM

## 2019-03-27 DIAGNOSIS — M5416 Radiculopathy, lumbar region: Secondary | ICD-10-CM

## 2019-03-27 DIAGNOSIS — G8250 Quadriplegia, unspecified: Secondary | ICD-10-CM

## 2019-03-27 DIAGNOSIS — M4722 Other spondylosis with radiculopathy, cervical region: Secondary | ICD-10-CM

## 2019-03-27 DIAGNOSIS — S14105S Unspecified injury at C5 level of cervical spinal cord, sequela: Secondary | ICD-10-CM

## 2019-03-27 MED ORDER — MORPHINE SULFATE 15 MG PO TABS: 15.0000 mg | ORAL_TABLET | Freq: Two times a day (BID) | ORAL | 0 refills | Status: DC | PRN

## 2019-03-27 NOTE — Telephone Encounter (Signed)
Patient called stating that the 2 a day Morphine sent in for her will not help her pain. She states she usually takes 3-4 a day. Please advise.

## 2019-03-27 NOTE — Telephone Encounter (Signed)
MS IR refilled as [facility] sent her home with ER instead of IR.

## 2019-03-28 MED ORDER — MORPHINE SULFATE 15 MG PO TABS: 15.0000 mg | ORAL_TABLET | Freq: Four times a day (QID) | ORAL | 0 refills | Status: DC | PRN

## 2019-03-28 NOTE — Telephone Encounter (Signed)
Adjusted rx sent.

## 2019-04-03 ENCOUNTER — Observation Stay
Admission: EM | Admit: 2019-04-03 | Discharge: 2019-04-05 | Disposition: A | Discharge status: Home / Self Care | Payer: Managed Care, Other (non HMO) | Attending: Internal Medicine | Admitting: Internal Medicine

## 2019-04-03 ENCOUNTER — Encounter: Payer: Self-pay | Admitting: Emergency Medicine

## 2019-04-03 ENCOUNTER — Emergency Department: Payer: Managed Care, Other (non HMO)

## 2019-04-03 DIAGNOSIS — Z20828 Contact with and (suspected) exposure to other viral communicable diseases: Secondary | ICD-10-CM | POA: Diagnosis not present

## 2019-04-03 DIAGNOSIS — K219 Gastro-esophageal reflux disease without esophagitis: Secondary | ICD-10-CM | POA: Diagnosis not present

## 2019-04-03 DIAGNOSIS — R0602 Shortness of breath: Secondary | ICD-10-CM

## 2019-04-03 DIAGNOSIS — Z79899 Other long term (current) drug therapy: Secondary | ICD-10-CM | POA: Diagnosis not present

## 2019-04-03 DIAGNOSIS — Z880 Allergy status to penicillin: Secondary | ICD-10-CM | POA: Diagnosis not present

## 2019-04-03 DIAGNOSIS — G8220 Paraplegia, unspecified: Secondary | ICD-10-CM | POA: Diagnosis not present

## 2019-04-03 DIAGNOSIS — Z9189 Other specified personal risk factors, not elsewhere classified: Secondary | ICD-10-CM

## 2019-04-03 DIAGNOSIS — R0902 Hypoxemia: Secondary | ICD-10-CM | POA: Diagnosis present

## 2019-04-03 DIAGNOSIS — F329 Major depressive disorder, single episode, unspecified: Secondary | ICD-10-CM | POA: Insufficient documentation

## 2019-04-03 DIAGNOSIS — J9601 Acute respiratory failure with hypoxia: Principal | ICD-10-CM | POA: Insufficient documentation

## 2019-04-03 DIAGNOSIS — M62838 Other muscle spasm: Secondary | ICD-10-CM | POA: Diagnosis not present

## 2019-04-03 DIAGNOSIS — Z888 Allergy status to other drugs, medicaments and biological substances status: Secondary | ICD-10-CM | POA: Insufficient documentation

## 2019-04-03 DIAGNOSIS — M170 Bilateral primary osteoarthritis of knee: Secondary | ICD-10-CM | POA: Insufficient documentation

## 2019-04-03 DIAGNOSIS — J984 Other disorders of lung: Secondary | ICD-10-CM | POA: Insufficient documentation

## 2019-04-03 DIAGNOSIS — D638 Anemia in other chronic diseases classified elsewhere: Secondary | ICD-10-CM | POA: Diagnosis not present

## 2019-04-03 LAB — CBC
HCT: 32.5 % — ABNORMAL LOW (ref 36.0–46.0)
Hemoglobin: 9.6 g/dL — ABNORMAL LOW (ref 12.0–15.0)
MCH: 29.2 pg (ref 26.0–34.0)
MCHC: 29.5 g/dL — ABNORMAL LOW (ref 30.0–36.0)
MCV: 98.8 fL (ref 80.0–100.0)
Platelets: 145 10*3/uL — ABNORMAL LOW (ref 150–400)
RBC: 3.29 MIL/uL — ABNORMAL LOW (ref 3.87–5.11)
RDW: 19.3 % — ABNORMAL HIGH (ref 11.5–15.5)
WBC: 9.9 10*3/uL (ref 4.0–10.5)
nRBC: 0.3 % — ABNORMAL HIGH (ref 0.0–0.2)

## 2019-04-03 LAB — BASIC METABOLIC PANEL
Anion gap: 7 (ref 5–15)
BUN: 19 mg/dL (ref 6–20)
CO2: 30 mmol/L (ref 22–32)
Calcium: 8.9 mg/dL (ref 8.9–10.3)
Chloride: 102 mmol/L (ref 98–111)
Creatinine, Ser: 0.48 mg/dL (ref 0.44–1.00)
GFR calc Af Amer: >60 mL/min (ref >60)
GFR calc non Af Amer: >60 mL/min (ref >60)
Glucose, Bld: 103 mg/dL — ABNORMAL HIGH (ref 70–99)
Potassium: 4.4 mmol/L (ref 3.5–5.1)
Sodium: 139 mmol/L (ref 135–145)

## 2019-04-03 LAB — TROPONIN I (HIGH SENSITIVITY): Troponin I (High Sensitivity): 2 ng/L (ref <18)

## 2019-04-03 LAB — HCG, QUANTITATIVE, PREGNANCY: hCG, Beta Chain, Quant, S: <1 m[IU]/mL (ref <5)

## 2019-04-03 MED ORDER — NALOXONE HCL 0.4 MG/ML IJ SOLN
0.2000 mg | Freq: Once | INTRAMUSCULAR | Status: AC
  Administered 2019-04-04: 0.2 mg via INTRAVENOUS
  Filled 2019-04-03: qty 1

## 2019-04-03 MED ORDER — IOHEXOL 350 MG/ML SOLN
75.0000 mL | Freq: Once | INTRAVENOUS | Status: AC | PRN
  Administered 2019-04-03: 22:00:00 75 mL via INTRAVENOUS

## 2019-04-03 NOTE — ED Triage Notes (Signed)
Pt in personal wheelchair,Pt seen at [facility] today and her O2 levels have been below 90 today. Pt sent home and told to call PCP. Pt has been at [facility] facility x6 weeks and returned home this past Saturday. Pt sts, "I didn't know this was an issue." Pts O2 in triage is 86% RA.
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
## 2019-04-03 NOTE — ED Notes (Signed)
O2 sat drops to 81% on RA, pt placed back on 2L. EDP Quentin Cornwall notified.

## 2019-04-03 NOTE — ED Notes (Signed)
Pt given meal tray, ice cream, and ginger ale per request

## 2019-04-04 ENCOUNTER — Other Ambulatory Visit: Payer: Self-pay

## 2019-04-04 DIAGNOSIS — R933 Abnormal findings on diagnostic imaging of other parts of digestive tract: Secondary | ICD-10-CM

## 2019-04-04 LAB — IRON AND TIBC
Iron: 93 ug/dL (ref 28–170)
Saturation Ratios: 35 % — ABNORMAL HIGH (ref 10.4–31.8)
TIBC: 266 ug/dL (ref 250–450)
UIBC: 173 ug/dL

## 2019-04-04 LAB — VITAMIN B12: Vitamin B-12: 612 pg/mL (ref 180–914)

## 2019-04-04 LAB — CBC
HCT: 34.5 % — ABNORMAL LOW (ref 36.0–46.0)
Hemoglobin: 10 g/dL — ABNORMAL LOW (ref 12.0–15.0)
MCH: 29.2 pg (ref 26.0–34.0)
MCHC: 29 g/dL — ABNORMAL LOW (ref 30.0–36.0)
MCV: 100.6 fL — ABNORMAL HIGH (ref 80.0–100.0)
Platelets: 142 10*3/uL — ABNORMAL LOW (ref 150–400)
RBC: 3.43 MIL/uL — ABNORMAL LOW (ref 3.87–5.11)
RDW: 19.5 % — ABNORMAL HIGH (ref 11.5–15.5)
WBC: 8.2 10*3/uL (ref 4.0–10.5)
nRBC: 0.4 % — ABNORMAL HIGH (ref 0.0–0.2)

## 2019-04-04 LAB — BASIC METABOLIC PANEL
Anion gap: 10 (ref 5–15)
BUN: 17 mg/dL (ref 6–20)
CO2: 27 mmol/L (ref 22–32)
Calcium: 8.9 mg/dL (ref 8.9–10.3)
Chloride: 103 mmol/L (ref 98–111)
Creatinine, Ser: 0.38 mg/dL — ABNORMAL LOW (ref 0.44–1.00)
GFR calc Af Amer: >60 mL/min (ref >60)
GFR calc non Af Amer: >60 mL/min (ref >60)
Glucose, Bld: 100 mg/dL — ABNORMAL HIGH (ref 70–99)
Potassium: 4.1 mmol/L (ref 3.5–5.1)
Sodium: 140 mmol/L (ref 135–145)

## 2019-04-04 LAB — RETICULOCYTES
Immature Retic Fract: 41.1 % — ABNORMAL HIGH (ref 2.3–15.9)
RBC.: 3.39 MIL/uL — ABNORMAL LOW (ref 3.87–5.11)
Retic Count, Absolute: 347.8 10*3/uL — ABNORMAL HIGH (ref 19.0–186.0)
Retic Ct Pct: 10.3 % — ABNORMAL HIGH (ref 0.4–3.1)

## 2019-04-04 LAB — FOLATE: Folate: 26 ng/mL (ref >5.9)

## 2019-04-04 LAB — FERRITIN: Ferritin: 392 ng/mL — ABNORMAL HIGH (ref 11–307)

## 2019-04-04 LAB — OCCULT BLOOD X 1 CARD TO LAB, STOOL: Fecal Occult Bld: NEGATIVE

## 2019-04-04 LAB — SARS CORONAVIRUS 2 (TAT 6-24 HRS): SARS Coronavirus 2: NEGATIVE

## 2019-04-04 LAB — LACTATE DEHYDROGENASE: LDH: 229 U/L — ABNORMAL HIGH (ref 98–192)

## 2019-04-04 LAB — HIV ANTIBODY (ROUTINE TESTING W REFLEX): HIV Screen 4th Generation wRfx: NONREACTIVE

## 2019-04-04 MED ORDER — MIRABEGRON ER 50 MG PO TB24
50.0000 mg | ORAL_TABLET | Freq: Every day | ORAL | Status: DC
  Administered 2019-04-04: 50 mg via ORAL
  Filled 2019-04-04 (×2): qty 1

## 2019-04-04 MED ORDER — ONDANSETRON HCL 4 MG/2ML IJ SOLN: 4.0000 mg | Freq: Four times a day (QID) | INTRAMUSCULAR | Status: DC | PRN

## 2019-04-04 MED ORDER — DARIFENACIN HYDROBROMIDE ER 7.5 MG PO TB24
7.5000 mg | ORAL_TABLET | Freq: Every day | ORAL | Status: DC
  Administered 2019-04-04: 7.5 mg via ORAL
  Filled 2019-04-04 (×3): qty 1

## 2019-04-04 MED ORDER — SIMETHICONE 80 MG PO CHEW
160.0000 mg | CHEWABLE_TABLET | Freq: Three times a day (TID) | ORAL | Status: DC
  Administered 2019-04-04 (×3): 160 mg via ORAL
  Filled 2019-04-04 (×6): qty 2

## 2019-04-04 MED ORDER — PANTOPRAZOLE SODIUM 40 MG PO TBEC
40.0000 mg | DELAYED_RELEASE_TABLET | Freq: Every day | ORAL | Status: DC
  Administered 2019-04-04: 40 mg via ORAL
  Filled 2019-04-04: qty 1

## 2019-04-04 MED ORDER — LACTULOSE 10 GM/15ML PO SOLN: 30.0000 g | Freq: Every day | ORAL | Status: DC | PRN

## 2019-04-04 MED ORDER — BISACODYL 10 MG RE SUPP
10.0000 mg | Freq: Every day | RECTAL | Status: DC
  Administered 2019-04-04: 08:00:00 10 mg via RECTAL
  Filled 2019-04-04 (×2): qty 1

## 2019-04-04 MED ORDER — PANTOPRAZOLE SODIUM 40 MG PO TBEC
40.0000 mg | DELAYED_RELEASE_TABLET | Freq: Two times a day (BID) | ORAL | Status: DC
  Administered 2019-04-04: 40 mg via ORAL
  Filled 2019-04-04: qty 1

## 2019-04-04 MED ORDER — ONDANSETRON HCL 4 MG PO TABS
4.0000 mg | ORAL_TABLET | Freq: Four times a day (QID) | ORAL | Status: DC | PRN
  Administered 2019-04-05: 4 mg via ORAL
  Filled 2019-04-04: qty 1

## 2019-04-04 MED ORDER — SULINDAC 150 MG PO TABS: 150.0000 mg | ORAL_TABLET | Freq: Two times a day (BID) | ORAL | Status: DC

## 2019-04-04 MED ORDER — KETOROLAC TROMETHAMINE 30 MG/ML IJ SOLN
15.0000 mg | Freq: Four times a day (QID) | INTRAMUSCULAR | Status: AC | PRN
  Administered 2019-04-04: 01:00:00 15 mg via INTRAVENOUS
  Filled 2019-04-04: qty 1

## 2019-04-04 MED ORDER — POLYETHYLENE GLYCOL 3350 17 G PO PACK
17.0000 g | PACK | Freq: Every day | ORAL | Status: DC
  Filled 2019-04-04: qty 1

## 2019-04-04 MED ORDER — PLECANATIDE 3 MG PO TABS: 3.0000 mg | ORAL_TABLET | Freq: Every day | ORAL | Status: DC

## 2019-04-04 MED ORDER — BISACODYL 10 MG RE SUPP
10.0000 mg | Freq: Once | RECTAL | Status: AC
  Administered 2019-04-04: 10 mg via RECTAL
  Filled 2019-04-04: qty 1

## 2019-04-04 MED ORDER — PREGABALIN 75 MG PO CAPS
150.0000 mg | ORAL_CAPSULE | Freq: Three times a day (TID) | ORAL | Status: DC
  Administered 2019-04-04: 150 mg via ORAL
  Filled 2019-04-04: qty 2

## 2019-04-04 MED ORDER — POLYETHYLENE GLYCOL 3350 17 G PO PACK: 17.0000 g | PACK | Freq: Every day | ORAL | Status: DC | PRN

## 2019-04-04 MED ORDER — ENOXAPARIN SODIUM 40 MG/0.4ML ~~LOC~~ SOLN
40.0000 mg | SUBCUTANEOUS | Status: DC
  Administered 2019-04-04: 22:00:00 40 mg via SUBCUTANEOUS
  Filled 2019-04-04: qty 0.4

## 2019-04-04 MED ORDER — SODIUM CHLORIDE 0.9 % IV SOLN: INTRAVENOUS | Status: DC

## 2019-04-04 MED ORDER — MORPHINE BOLUS VIA INFUSION: 2.0000 mg | Freq: Four times a day (QID) | INTRAVENOUS | Status: DC | PRN

## 2019-04-04 MED ORDER — TRAZODONE HCL 50 MG PO TABS: 25.0000 mg | ORAL_TABLET | Freq: Every evening | ORAL | Status: DC | PRN

## 2019-04-04 MED ORDER — B COMPLEX-C PO TABS
1.0000 | ORAL_TABLET | Freq: Every day | ORAL | Status: DC
  Administered 2019-04-04: 1 via ORAL
  Filled 2019-04-04 (×2): qty 1

## 2019-04-04 MED ORDER — TRIMETHOPRIM 100 MG PO TABS
100.0000 mg | ORAL_TABLET | Freq: Every day | ORAL | Status: DC
  Administered 2019-04-04: 100 mg via ORAL
  Filled 2019-04-04 (×2): qty 1

## 2019-04-04 MED ORDER — FENOPROFEN CALCIUM 200 MG PO CAPS: 200.0000 mg | ORAL_CAPSULE | Freq: Three times a day (TID) | ORAL | Status: DC

## 2019-04-04 MED ORDER — CITALOPRAM HYDROBROMIDE 20 MG PO TABS
20.0000 mg | ORAL_TABLET | Freq: Every day | ORAL | Status: DC
  Administered 2019-04-04: 20 mg via ORAL
  Filled 2019-04-04: qty 1

## 2019-04-04 MED ORDER — DANTROLENE SODIUM 25 MG PO CAPS
100.0000 mg | ORAL_CAPSULE | Freq: Four times a day (QID) | ORAL | Status: DC
  Administered 2019-04-04 (×4): 100 mg via ORAL
  Filled 2019-04-04 (×4): qty 4

## 2019-04-04 MED ORDER — URELLE 81 MG PO TABS
1.0000 | ORAL_TABLET | Freq: Three times a day (TID) | ORAL | Status: DC | PRN
  Administered 2019-04-04: 10:00:00 81 mg via ORAL
  Filled 2019-04-04 (×2): qty 1

## 2019-04-04 MED ORDER — MORPHINE SULFATE (PF) 2 MG/ML IV SOLN
2.0000 mg | Freq: Four times a day (QID) | INTRAVENOUS | Status: DC | PRN
  Administered 2019-04-04 – 2019-04-05 (×4): 2 mg via INTRAVENOUS
  Filled 2019-04-04 (×4): qty 1

## 2019-04-04 MED ORDER — ACETAMINOPHEN 650 MG RE SUPP: 650.0000 mg | Freq: Four times a day (QID) | RECTAL | Status: DC | PRN

## 2019-04-04 MED ORDER — ACETAMINOPHEN 325 MG PO TABS
650.0000 mg | ORAL_TABLET | Freq: Four times a day (QID) | ORAL | Status: DC | PRN
  Administered 2019-04-04: 650 mg via ORAL
  Filled 2019-04-04: qty 2

## 2019-04-04 NOTE — ED Notes (Signed)
Patient given warm blanket, repositioned and room darkened for comfort. Patient appears drowsy at this time.

## 2019-04-04 NOTE — Progress Notes (Signed)
Pt refuses flu/pneumonia vaccine. Pt states no history of pneumonia vaccine. Pdowless, rn

## 2019-04-04 NOTE — Progress Notes (Signed)
Refuses IVF's. Education done and pt refuses. Pdowless, rn

## 2019-04-04 NOTE — ED Notes (Signed)
Patient was alert and oriented but c/o feeling drowsy before Narcan administration. Patient's pulse ox increased to 96% on 3L, but patient now c/o feeling nauseous.MD aware.

## 2019-04-04 NOTE — Progress Notes (Signed)
[employer] - [place] at [facility]                                                                                                                                                                                  Patient Demographics   Jasmine EddyBrenda [name], is a 43 y.o. female, DOB - 17-Mar-1976, NWG:[id number]  Admit date - 04/03/2019   Admitting Physician Hannah BeatJan A Mansy, MD  Outpatient Primary MD for the patient is [facility]   LOS - 0  Subjective: Patient admitted with shortness of breath and hypoxia Currently feeling little better wanted to leave AGAINST MEDICAL ADVICE earlier   Review of Systems:   CONSTITUTIONAL: No documented fever. No fatigue, weakness. No weight gain, no weight loss.  EYES: No blurry or double vision.  ENT: No tinnitus. No postnasal drip. No redness of the oropharynx.  RESPIRATORY: No cough, no wheeze, no hemoptysis. No dyspnea.  CARDIOVASCULAR: No chest pain. No orthopnea. No palpitations. No syncope.  GASTROINTESTINAL: No nausea, no vomiting or diarrhea. No abdominal pain. No melena or hematochezia.  GENITOURINARY: No dysuria or hematuria.  ENDOCRINE: No polyuria or nocturia. No heat or cold intolerance.  HEMATOLOGY: No anemia. No bruising. No bleeding.  INTEGUMENTARY: No rashes. No lesions.  MUSCULOSKELETAL: No arthritis. No swelling. No gout.  NEUROLOGIC: No numbness, tingling, or ataxia. No seizure-type activity.  PSYCHIATRIC: No anxiety. No insomnia. No ADD.    Vitals:   Vitals:   04/04/19 0145 04/04/19 0245 04/04/19 0336 04/04/19 0759  BP: 117/79 92/61  100/60  Pulse: 80 78  90  Resp:  17  17  Temp:    98 F (36.7 C)  TempSrc:    Oral  SpO2: 92% 94%  97%  Weight:   81.6 kg   Height:   5\' 1"  (1.549 m)     Wt Readings from Last 3 Encounters:  04/04/19 81.6 kg  12/07/18 76.7 kg  10/05/18 77.1 kg     Intake/Output Summary (Last 24 hours) at 04/04/2019 1335 Last data filed at 04/04/2019 0954 Gross per 24 hour   Intake 120 ml  Output 600 ml  Net -480 ml    Physical Exam:   GENERAL: Pleasant-appearing in no apparent distress.  HEAD, EYES, EARS, NOSE AND THROAT: Atraumatic, normocephalic. Extraocular muscles are intact. Pupils equal and reactive to light. Sclerae anicteric. No conjunctival injection. No oro-pharyngeal erythema.  NECK: Supple. There is no jugular venous distention. No bruits, no lymphadenopathy, no thyromegaly.  HEART: Regular rate and rhythm,. No murmurs, no rubs, no clicks.  LUNGS: Clear to auscultation bilaterally. No rales or rhonchi. No wheezes.  ABDOMEN: Soft, flat, nontender, nondistended. Has good bowel sounds. No  hepatosplenomegaly appreciated.  EXTREMITIES: No evidence of any cyanosis, clubbing, or peripheral edema.  +2 pedal and radial pulses bilaterally.  NEUROLOGIC: The patient is alert, awake, and oriented x3  SKIN: Moist and warm with no rashes appreciated.  Psych: Not anxious, depressed LN: No inguinal LN enlargement    Antibiotics   Anti-infectives (From admission, onward)   None      Medications   Scheduled Meds: . B-complex with vitamin C  1 tablet Oral Daily  . bisacodyl  10 mg Rectal Q0600  . citalopram  20 mg Oral Daily  . dantrolene  100 mg Oral QID  . darifenacin  7.5 mg Oral Daily  . enoxaparin (LOVENOX) injection  40 mg Subcutaneous Q24H  . Fenoprofen Calcium  200 mg Oral TID  . mirabegron ER  50 mg Oral Daily  . pantoprazole  40 mg Oral BID  . Plecanatide  3 mg Oral Daily  . polyethylene glycol  17 g Oral Daily  . simethicone  160 mg Oral TID PC & HS  . sulindac  150 mg Oral BID   Continuous Infusions: PRN Meds:.acetaminophen **OR** acetaminophen, ketorolac, lactulose, morphine injection, ondansetron **OR** ondansetron (ZOFRAN) IV, polyethylene glycol, traZODone, Urelle   Data Review:   Micro Results Recent Results (from the past 240 hour(s))  SARS CORONAVIRUS 2 (TAT 6-24 HRS) Nasopharyngeal Nasopharyngeal Swab     Status: None    Collection Time: 04/03/19  9:29 PM   Specimen: Nasopharyngeal Swab  Result Value Ref Range Status   SARS Coronavirus 2 NEGATIVE NEGATIVE Final    Comment: (NOTE) SARS-CoV-2 target nucleic acids are NOT DETECTED. The SARS-CoV-2 RNA is generally detectable in upper and lower respiratory specimens during the acute phase of infection. Negative results do not preclude SARS-CoV-2 infection, do not rule out co-infections with other pathogens, and should not be used as the sole basis for treatment or other patient management decisions. Negative results must be combined with clinical observations, patient history, and epidemiological information. The expected result is Negative. Fact Sheet for Patients: SugarRoll.be Fact Sheet for Healthcare Providers: https://www.woods-mathews.com/ This test is not yet approved or cleared by the [place] FDA and  has been authorized for detection and/or diagnosis of SARS-CoV-2 by FDA under an Emergency Use Authorization (EUA). This EUA will remain  in effect (meaning this test can be used) for the duration of the COVID-19 declaration under Section 56 4(b)(1) of the Act, 21 U.S.C. section 360bbb-3(b)(1), unless the authorization is terminated or revoked sooner. Performed at [facility], [place] [address] [place], [place] [address]     Radiology Reports Ct Angio Chest Pe W And/or Wo Contrast  Result Date: 04/03/2019 CLINICAL DATA:  Hypoxia EXAM: CT ANGIOGRAPHY CHEST WITH CONTRAST TECHNIQUE: Multidetector CT imaging of the chest was performed using the standard protocol during bolus administration of intravenous contrast. Multiplanar CT image reconstructions and MIPs were obtained to evaluate the vascular anatomy. CONTRAST:  15mL OMNIPAQUE IOHEXOL 350 MG/ML SOLN COMPARISON:  Chest x-ray from earlier in the same day. FINDINGS: Cardiovascular: Thoracic aorta demonstrates a normal branching pattern without  aneurysmal dilatation or dissection. Heart is at the upper limits of normal in size. The pulmonary artery shows a normal branching pattern. No filling defects are identified to suggest pulmonary embolism. Mediastinum/Nodes: Thoracic inlet is within normal limits. The esophagus is dilated with fluid consistent with reflux. No hilar or mediastinal adenopathy is noted. Lungs/Pleura: The lungs are well aerated bilaterally. Minimal dependent atelectatic changes are noted. No focal infiltrate or  sizable effusion is seen. Upper Abdomen: Visualized upper abdomen is within normal limits. No bony abnormality is noted. Musculoskeletal: No chest wall abnormality. No acute or significant osseous findings. Review of the MIP images confirms the above findings. IMPRESSION: No evidence of pulmonary emboli. Minimal dependent atelectatic changes. Changes of reflux with fluid in the esophagus. Electronically Signed   By: Alcide Clever M.D.   On: 04/03/2019 22:26   Dg Chest Port 1 View  Result Date: 04/03/2019 CLINICAL DATA:  Shortness of breath and decreased oxygen saturation EXAM: PORTABLE CHEST 1 VIEW COMPARISON:  11/10/2015 FINDINGS: Cardiac shadows within normal limits. The lungs are clear bilaterally with the exception of minimal left basilar scarring/atelectasis. No bony abnormality is seen. No other focal abnormality is noted. IMPRESSION: Minimal left basilar atelectasis/scarring. No acute abnormality noted. Electronically Signed   By: Alcide Clever M.D.   On: 04/03/2019 19:28     CBC Recent Labs  Lab 04/03/19 1921 04/04/19 0737  WBC 9.9 8.2  HGB 9.6* 10.0*  HCT 32.5* 34.5*  PLT 145* 142*  MCV 98.8 100.6*  MCH 29.2 29.2  MCHC 29.5* 29.0*  RDW 19.3* 19.5*    Chemistries  Recent Labs  Lab 04/03/19 1921 04/04/19 0737  NA 139 140  K 4.4 4.1  CL 102 103  CO2 30 27  GLUCOSE 103* 100*  BUN 19 17  CREATININE 0.48 0.38*  CALCIUM 8.9 8.9    ------------------------------------------------------------------------------------------------------------------ estimated creatinine clearance is 86.8 mL/min (A) (by C-G formula based on SCr of 0.38 mg/dL (L)). ------------------------------------------------------------------------------------------------------------------ No results for input(s): HGBA1C in the last 72 hours. ------------------------------------------------------------------------------------------------------------------ No results for input(s): CHOL, HDL, LDLCALC, TRIG, CHOLHDL, LDLDIRECT in the last 72 hours. ------------------------------------------------------------------------------------------------------------------ No results for input(s): TSH, T4TOTAL, T3FREE, THYROIDAB in the last 72 hours.  Invalid input(s): FREET3 ------------------------------------------------------------------------------------------------------------------ Recent Labs    04/04/19 0737  VITAMINB12 612  FOLATE 26.0  FERRITIN 392*  TIBC 266  IRON 93  RETICCTPCT 10.3*    Coagulation profile No results for input(s): INR, PROTIME in the last 168 hours.  No results for input(s): DDIMER in the last 72 hours.  Cardiac Enzymes No results for input(s): CKMB, TROPONINI, MYOGLOBIN in the last 168 hours.  Invalid input(s): CK ------------------------------------------------------------------------------------------------------------------ Invalid input(s): POCBNP    Assessment & Plan   1.  Dyspnea with associated hypoxia.   Suspect this is due to patient's limited mobility state.  I will start her on incentive spirometry May need oxygen for home I have asked nurse to wean her off oxygen if possible  2.  Abnormal CT scan with fluid full esophagus suspect could be gastroparesis have asked GI to see the patient I will increase her PPI to twice daily   3.  Paraplegia with associated muscle spasms.  We will continue baclofen for  now as well as place her on as needed IV Toradol for pain.  4.  Depression.  Celexa will be resumed.  5.  GERD.  This was obvious on her CT scan.  We will continue PPI therapy.  6.  Anemia of chronic disease follow repeat CBC in the morning     Code Status Orders  (From admission, onward)         Start     Ordered   04/04/19 0005  Full code  Continuous     04/04/19 0007        Code Status History    Date Active Date Inactive Code Status Order ID Comments User Context   03/18/2018 1609 03/21/2018  1813 Full Code [id number]  Shaune Pollack, MD Inpatient   10/18/2015 1856 11/15/2015 1358 Full Code [id number]  Lynnae Prude Inpatient   10/10/2015 2250 10/18/2015 1856 Full Code [id number]  Vernie Murders, RN Inpatient   Advance Care Planning Activity           Consults  gi  DVT Prophylaxis   lovenox  Lab Results  Component Value Date   PLT 142 (L) 04/04/2019     Time Spent in minutes    Greater than 50% of time spent in care coordination and counseling patient regarding the condition and plan of care.   Auburn Bilberry M.D on 04/04/2019 at 1:35 PM  Between 7am to 6pm - Pager - [phone]  After 6pm go to [email or web address] - Social research officer, government  [employer]   Office  [phone]

## 2019-04-04 NOTE — ED Notes (Signed)
Patient c/o muscle spasms after narcan administration. Dr. Sidney Ace aware and ordered Toradol 15mg  IV q6hr prn

## 2019-04-04 NOTE — Progress Notes (Signed)
Pt admitted to unit from ED. Pt tele placed. Pt alert and oriented. No obivious distress. Pt answers question appropriately. Pt in and out cath with 215ml urine. Odor noted. Pt states her husband in and out caths her at home. Pt refuses Lovenox and MRSA test. Pt on oxygen 3LNC at this time. Pdowless, rn
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
## 2019-04-05 LAB — URINALYSIS, COMPLETE (UACMP) WITH MICROSCOPIC
Bacteria, UA: NONE SEEN
Bilirubin Urine: NEGATIVE
Glucose, UA: NEGATIVE mg/dL
Hgb urine dipstick: NEGATIVE
Ketones, ur: NEGATIVE mg/dL
Leukocytes,Ua: NEGATIVE
Nitrite: POSITIVE — AB
Protein, ur: NEGATIVE mg/dL
Specific Gravity, Urine: 1.018 (ref 1.005–1.030)
pH: 6 (ref 5.0–8.0)

## 2019-04-05 MED ORDER — PANTOPRAZOLE SODIUM 40 MG PO TBEC: 40.0000 mg | DELAYED_RELEASE_TABLET | Freq: Every day | ORAL | 0 refills | Status: DC

## 2019-04-05 NOTE — Progress Notes (Signed)
Pt was in and out cath with 300 mL urine ouput.Pt also instructed [place] to leave room so they can have privacy.

## 2019-04-05 NOTE — Discharge Instructions (Signed)
Resume diet and activity as before.  Please stay upright for at least 2 hours after eating to prevent reflux.  Smoking, alcohol, mint, spicy food, coffee can make stomach acid reflux worse.

## 2019-04-05 NOTE — Plan of Care (Signed)
Discharge order received. Patient mental status is at baseline. Vital signs stable . No signs of acute distress. Discharge instructions given. Patient verbalized understanding. No other issues noted at this time.   

## 2019-04-06 LAB — HAPTOGLOBIN: Haptoglobin: <10 mg/dL — ABNORMAL LOW (ref 42–296)
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
## 2019-04-20 ENCOUNTER — Other Ambulatory Visit: Payer: Self-pay | Admitting: Physical Medicine & Rehabilitation

## 2019-04-20 ENCOUNTER — Telehealth: Payer: Self-pay

## 2019-04-20 NOTE — Telephone Encounter (Signed)
Patient called from [place] and states it is an emergency please contact her husbands phone number [phone]-- they need for you to reply to their earlier message -has some bearing on her DC per patient

## 2019-04-24 ENCOUNTER — Other Ambulatory Visit: Payer: Self-pay | Admitting: Physical Medicine & Rehabilitation

## 2019-04-26 ENCOUNTER — Encounter: Payer: Self-pay | Admitting: Physical Medicine & Rehabilitation

## 2019-04-26 ENCOUNTER — Other Ambulatory Visit: Payer: Self-pay

## 2019-04-26 ENCOUNTER — Encounter
Payer: Managed Care, Other (non HMO) | Attending: Physical Medicine & Rehabilitation | Admitting: Physical Medicine & Rehabilitation

## 2019-04-26 VITALS — HR 100 | Temp 96.7°F

## 2019-04-26 DIAGNOSIS — K592 Neurogenic bowel, not elsewhere classified: Secondary | ICD-10-CM | POA: Diagnosis present

## 2019-04-26 DIAGNOSIS — S14105S Unspecified injury at C5 level of cervical spinal cord, sequela: Secondary | ICD-10-CM | POA: Insufficient documentation

## 2019-04-26 DIAGNOSIS — F4323 Adjustment disorder with mixed anxiety and depressed mood: Secondary | ICD-10-CM | POA: Insufficient documentation

## 2019-04-26 DIAGNOSIS — M5416 Radiculopathy, lumbar region: Secondary | ICD-10-CM | POA: Diagnosis present

## 2019-04-26 DIAGNOSIS — G8250 Quadriplegia, unspecified: Secondary | ICD-10-CM | POA: Diagnosis present

## 2019-04-26 DIAGNOSIS — M4722 Other spondylosis with radiculopathy, cervical region: Secondary | ICD-10-CM | POA: Diagnosis present

## 2019-04-26 MED ORDER — CLONAZEPAM 1 MG PO TABS: ORAL_TABLET | ORAL | 3 refills | Status: DC

## 2019-04-26 MED ORDER — PANTOPRAZOLE SODIUM 40 MG PO TBEC: 40.0000 mg | DELAYED_RELEASE_TABLET | Freq: Every day | ORAL | 4 refills | Status: DC

## 2019-04-26 MED ORDER — MORPHINE SULFATE 15 MG PO TABS: 15.0000 mg | ORAL_TABLET | Freq: Four times a day (QID) | ORAL | 0 refills | Status: DC | PRN

## 2019-04-26 MED ORDER — CITALOPRAM HYDROBROMIDE 40 MG PO TABS: 40.0000 mg | ORAL_TABLET | Freq: Every day | ORAL | 4 refills | Status: DC

## 2019-04-26 NOTE — Patient Instructions (Signed)
PLEASE FEEL FREE TO CALL OUR OFFICE WITH ANY PROBLEMS OR QUESTIONS ([phone])      
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
## 2019-05-12 ENCOUNTER — Telehealth: Payer: Self-pay | Admitting: *Deleted

## 2019-05-12 MED ORDER — METHOCARBAMOL 500 MG PO TABS: 500.0000 mg | ORAL_TABLET | Freq: Four times a day (QID) | ORAL | 1 refills | Status: DC

## 2019-05-12 NOTE — Telephone Encounter (Signed)
Robaxin refilled per pt request

## 2019-05-12 NOTE — Telephone Encounter (Signed)
Jasmine [name] has called back since she sent the mychart message and is asking since she is out of methocarbamol, can she go back on dantrolene which is what she wants to do. Please advise.

## 2019-05-20 ENCOUNTER — Other Ambulatory Visit: Payer: Self-pay | Admitting: Physical Medicine & Rehabilitation

## 2019-05-20 DIAGNOSIS — F4323 Adjustment disorder with mixed anxiety and depressed mood: Secondary | ICD-10-CM

## 2019-05-29 ENCOUNTER — Telehealth: Payer: Self-pay | Admitting: Registered Nurse

## 2019-05-29 DIAGNOSIS — M4722 Other spondylosis with radiculopathy, cervical region: Secondary | ICD-10-CM

## 2019-05-29 DIAGNOSIS — S14105S Unspecified injury at C5 level of cervical spinal cord, sequela: Secondary | ICD-10-CM

## 2019-05-29 DIAGNOSIS — G8250 Quadriplegia, unspecified: Secondary | ICD-10-CM

## 2019-05-29 DIAGNOSIS — M5416 Radiculopathy, lumbar region: Secondary | ICD-10-CM

## 2019-05-29 DIAGNOSIS — K592 Neurogenic bowel, not elsewhere classified: Secondary | ICD-10-CM

## 2019-05-29 MED ORDER — MORPHINE SULFATE 15 MG PO TABS: 15.0000 mg | ORAL_TABLET | Freq: Four times a day (QID) | ORAL | 0 refills | Status: DC | PRN

## 2019-05-29 NOTE — Telephone Encounter (Signed)
PMP was reviewed: Spoke with Pharmacist: Pharmacist states they don't have the corrected MSIR prescription on file, as noted on Dr. Naaman Plummer prescription. New prescription e-file with post dated date. Placed a call to Ms. Overturf, no answer regarding the above. Left voicemail to call with any questions or concerns.

## 2019-06-07 ENCOUNTER — Other Ambulatory Visit: Payer: Self-pay

## 2019-06-07 ENCOUNTER — Encounter
Payer: Managed Care, Other (non HMO) | Attending: Physical Medicine & Rehabilitation | Admitting: Physical Medicine & Rehabilitation

## 2019-06-07 ENCOUNTER — Encounter: Payer: Self-pay | Admitting: Physical Medicine & Rehabilitation

## 2019-06-07 VITALS — BP 116/79 | HR 86 | Temp 97.7°F

## 2019-06-07 DIAGNOSIS — G8250 Quadriplegia, unspecified: Secondary | ICD-10-CM | POA: Diagnosis present

## 2019-06-07 DIAGNOSIS — F4323 Adjustment disorder with mixed anxiety and depressed mood: Secondary | ICD-10-CM | POA: Diagnosis present

## 2019-06-07 DIAGNOSIS — K592 Neurogenic bowel, not elsewhere classified: Secondary | ICD-10-CM | POA: Diagnosis present

## 2019-06-07 DIAGNOSIS — S14105S Unspecified injury at C5 level of cervical spinal cord, sequela: Secondary | ICD-10-CM | POA: Diagnosis not present

## 2019-06-07 DIAGNOSIS — M4722 Other spondylosis with radiculopathy, cervical region: Secondary | ICD-10-CM | POA: Diagnosis present

## 2019-06-07 DIAGNOSIS — M5416 Radiculopathy, lumbar region: Secondary | ICD-10-CM | POA: Diagnosis present

## 2019-06-07 MED ORDER — MORPHINE SULFATE 15 MG PO TABS: 15.0000 mg | ORAL_TABLET | Freq: Four times a day (QID) | ORAL | 0 refills | Status: DC | PRN

## 2019-06-07 NOTE — Patient Instructions (Signed)
PLEASE FEEL FREE TO CALL OUR OFFICE WITH ANY PROBLEMS OR QUESTIONS ([phone])      

## 2019-06-07 NOTE — Progress Notes (Signed)
Botox Injection for spasticity using needle EMG guidance Indication: spastic tetraplegia G82.50  Current baclofen pump is at 343mcg/day Dilution: 100 Units/ml        Total Units Injected: 600 Indication: Severe spasticity which interferes with ADL,mobility and/or  hygiene and is unresponsive to medication management and other conservative care Informed consent was obtained after describing risks and benefits of the procedure with the patient. This includes bleeding, bruising, infection, excessive weakness, or medication side effects. A REMS form is on file and signed.  Needle: 21mm injectable monopolar needle electrode  Bilateral  Number of units per muscle Pectoralis Major 0 units Pectoralis Minor 0 units Biceps 0 units Brachioradialis 0 units FCR 50 units left, 25 units right FCU 50 units left, 25 units right FDS 150 units left, 75 units right FDP 150 units left, 75units right FPL 0 units left, 0 units right Pronator Teres 0 units Pronator Quadratus 0 units Lumbricals 0 units left, 0 units right All injections were done after obtaining appropriate EMG activity and after negative drawback for blood. The patient tolerated the procedure well. Post procedure instructions were given. Return in about 7 weeks (around 07/26/2019).   Stop robaxin, increase dantrium to 100mg  bid

## 2019-06-26 NOTE — Telephone Encounter (Signed)
Jasmine [name] has tolerated dantrolene 100mg  bid since 12/30. Says has been feeling better. Oxygenation nearly 100%. May increase to TID.

## 2019-07-20 ENCOUNTER — Telehealth: Payer: Self-pay | Admitting: *Deleted

## 2019-07-20 NOTE — Telephone Encounter (Signed)
Rexene Edison, [facility] left a message stating that she has received an order for catheters. She states that she needs clinic notes that reflect incontinence or retention and why their is a need for 'Coude' fr 14 catheters. She says she needs the documentation for the prior authorization for insurance

## 2019-07-20 NOTE — Telephone Encounter (Signed)
The PATIENT needs to furnish [place] some rationale as to why she needs this specific catheter. I do not have that info.

## 2019-07-21 NOTE — Telephone Encounter (Signed)
I spoke to the patient.  She is going to contact her catheter supply company and inform that she is seen by urology.

## 2019-07-26 ENCOUNTER — Encounter
Payer: Managed Care, Other (non HMO) | Attending: Physical Medicine & Rehabilitation | Admitting: Physical Medicine & Rehabilitation

## 2019-07-26 ENCOUNTER — Other Ambulatory Visit: Payer: Self-pay

## 2019-07-26 ENCOUNTER — Encounter: Payer: Self-pay | Admitting: Physical Medicine & Rehabilitation

## 2019-07-26 VITALS — BP 107/76 | HR 93

## 2019-07-26 DIAGNOSIS — M4722 Other spondylosis with radiculopathy, cervical region: Secondary | ICD-10-CM | POA: Insufficient documentation

## 2019-07-26 DIAGNOSIS — F4323 Adjustment disorder with mixed anxiety and depressed mood: Secondary | ICD-10-CM | POA: Diagnosis present

## 2019-07-26 DIAGNOSIS — G8250 Quadriplegia, unspecified: Secondary | ICD-10-CM | POA: Diagnosis present

## 2019-07-26 DIAGNOSIS — M5416 Radiculopathy, lumbar region: Secondary | ICD-10-CM | POA: Diagnosis present

## 2019-07-26 DIAGNOSIS — S14105S Unspecified injury at C5 level of cervical spinal cord, sequela: Secondary | ICD-10-CM | POA: Insufficient documentation

## 2019-07-26 DIAGNOSIS — K592 Neurogenic bowel, not elsewhere classified: Secondary | ICD-10-CM | POA: Diagnosis present

## 2019-07-26 MED ORDER — MORPHINE SULFATE 15 MG PO TABS: 15.0000 mg | ORAL_TABLET | Freq: Four times a day (QID) | ORAL | 0 refills | Status: DC | PRN
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
## 2019-07-26 NOTE — Patient Instructions (Addendum)
  Jasmine [name] HAS A C5 SPINAL CORD INJURY WITH TETRAPLEGIA AND SPASTICITY WHICH HAS RESULTED IN A FULL LOSS OF FUNCTION.  PLEASE LET ME KNOW IF YOU NEED ANY FURTHER INFORMATION ABOUT YOUR DIAGNOSIS.    Ranelle Oyster MD, [place]

## 2019-07-27 ENCOUNTER — Telehealth: Payer: Self-pay

## 2019-07-27 DIAGNOSIS — M5416 Radiculopathy, lumbar region: Secondary | ICD-10-CM

## 2019-07-27 DIAGNOSIS — M4722 Other spondylosis with radiculopathy, cervical region: Secondary | ICD-10-CM

## 2019-07-27 DIAGNOSIS — K592 Neurogenic bowel, not elsewhere classified: Secondary | ICD-10-CM

## 2019-07-27 DIAGNOSIS — S14105S Unspecified injury at C5 level of cervical spinal cord, sequela: Secondary | ICD-10-CM

## 2019-07-27 DIAGNOSIS — G8250 Quadriplegia, unspecified: Secondary | ICD-10-CM

## 2019-07-27 NOTE — Telephone Encounter (Signed)
Did not go through to pharmacy due to computer error. 

## 2019-07-28 MED ORDER — MORPHINE SULFATE 15 MG PO TABS: 15.0000 mg | ORAL_TABLET | Freq: Four times a day (QID) | ORAL | 0 refills | Status: DC | PRN

## 2019-07-28 NOTE — Telephone Encounter (Signed)
Med sent.

## 2019-07-31 ENCOUNTER — Ambulatory Visit (INDEPENDENT_AMBULATORY_CARE_PROVIDER_SITE_OTHER): Payer: 59 | Admitting: Psychology

## 2019-07-31 DIAGNOSIS — F4310 Post-traumatic stress disorder, unspecified: Secondary | ICD-10-CM

## 2019-08-04 ENCOUNTER — Telehealth: Payer: Self-pay | Admitting: Physical Medicine & Rehabilitation

## 2019-08-04 DIAGNOSIS — M5416 Radiculopathy, lumbar region: Secondary | ICD-10-CM

## 2019-08-04 NOTE — Telephone Encounter (Signed)
Perhaps we should just do an CT. I have put in a new order. Thanks for your follow up April!

## 2019-08-04 NOTE — Telephone Encounter (Signed)
Arena with [facility] can't do MRI since patient has a pain pump.  She stated that it has to be emptied prior to her having MRI and patient can't go with out it.  [facility] will cancel appt with patient.  I did call [facility]  and they can do MRI there with the Baclofen pump, but patient would need to have doctor that put it in check it right after she has it done.  I read note and it looks like patient had this done at [facility].  Please advise of what we need to do from here.

## 2019-08-05 DIAGNOSIS — G959 Disease of spinal cord, unspecified: Secondary | ICD-10-CM

## 2019-08-11 ENCOUNTER — Ambulatory Visit (INDEPENDENT_AMBULATORY_CARE_PROVIDER_SITE_OTHER): Payer: 59 | Admitting: Psychology

## 2019-08-11 DIAGNOSIS — F4310 Post-traumatic stress disorder, unspecified: Secondary | ICD-10-CM | POA: Diagnosis not present

## 2019-08-14 ENCOUNTER — Telehealth: Payer: Self-pay | Admitting: Physical Medicine & Rehabilitation

## 2019-08-14 NOTE — Telephone Encounter (Signed)
Melton Alar with [facility] called to let us know that Rosann Auerbach has denied the CT scan for patient.  I have never received anything back from them stating that.  I told her that the doctor was on vacation this week and will not return until 08/21/19.  They will go ahead and cancel this appt with patient.

## 2019-08-16 ENCOUNTER — Other Ambulatory Visit: Payer: Managed Care, Other (non HMO)

## 2019-08-19 ENCOUNTER — Other Ambulatory Visit: Payer: Self-pay | Admitting: Physical Medicine & Rehabilitation

## 2019-08-19 DIAGNOSIS — S14105S Unspecified injury at C5 level of cervical spinal cord, sequela: Secondary | ICD-10-CM

## 2019-08-19 DIAGNOSIS — M5416 Radiculopathy, lumbar region: Secondary | ICD-10-CM

## 2019-08-19 DIAGNOSIS — G8254 Quadriplegia, C5-C7 incomplete: Secondary | ICD-10-CM

## 2019-08-19 DIAGNOSIS — M4722 Other spondylosis with radiculopathy, cervical region: Secondary | ICD-10-CM

## 2019-08-19 DIAGNOSIS — G8250 Quadriplegia, unspecified: Secondary | ICD-10-CM

## 2019-08-21 NOTE — Telephone Encounter (Signed)
Is there an appeal process for this?

## 2019-08-22 NOTE — Telephone Encounter (Signed)
Spoke with [place]. New order entered. They are reviewing case.

## 2019-08-24 ENCOUNTER — Ambulatory Visit: Payer: 59 | Admitting: Psychology

## 2019-08-28 ENCOUNTER — Other Ambulatory Visit: Payer: Self-pay | Admitting: Physical Medicine & Rehabilitation

## 2019-08-28 ENCOUNTER — Other Ambulatory Visit: Payer: Managed Care, Other (non HMO)

## 2019-08-28 DIAGNOSIS — G8250 Quadriplegia, unspecified: Secondary | ICD-10-CM

## 2019-08-28 DIAGNOSIS — S14105S Unspecified injury at C5 level of cervical spinal cord, sequela: Secondary | ICD-10-CM

## 2019-08-29 ENCOUNTER — Telehealth: Payer: Self-pay

## 2019-08-29 NOTE — Telephone Encounter (Signed)
Ptn husband Apolinar Junes called and wants return call [phone]

## 2019-08-29 NOTE — Telephone Encounter (Signed)
Message from patient

## 2019-08-29 NOTE — Telephone Encounter (Signed)
Contacted pt's husband.  They have been trying hard to get full time disability status through [employer]. Apolinar Junes has finally got a response from [employer] indicating what they need to get medical claim approved.  He will be sending a mychart message  For Dr. Riley Kill indicating the need for a letter that indicates that patient has been paralyzed for greater than X amount of days.

## 2019-08-30 ENCOUNTER — Encounter: Payer: Self-pay | Admitting: *Deleted

## 2019-08-30 NOTE — Telephone Encounter (Signed)
Letter written for insurance.

## 2019-08-30 NOTE — Telephone Encounter (Signed)
Letter written

## 2019-08-30 NOTE — Telephone Encounter (Signed)
Message with attachment from patients husband

## 2019-09-08 ENCOUNTER — Other Ambulatory Visit: Payer: Self-pay | Admitting: Physical Medicine & Rehabilitation

## 2019-09-30 ENCOUNTER — Other Ambulatory Visit: Payer: Self-pay

## 2019-09-30 DIAGNOSIS — M4722 Other spondylosis with radiculopathy, cervical region: Secondary | ICD-10-CM

## 2019-09-30 DIAGNOSIS — M5416 Radiculopathy, lumbar region: Secondary | ICD-10-CM

## 2019-09-30 DIAGNOSIS — S14105S Unspecified injury at C5 level of cervical spinal cord, sequela: Secondary | ICD-10-CM

## 2019-09-30 DIAGNOSIS — G8250 Quadriplegia, unspecified: Secondary | ICD-10-CM

## 2019-09-30 DIAGNOSIS — K592 Neurogenic bowel, not elsewhere classified: Secondary | ICD-10-CM

## 2019-10-02 MED ORDER — MORPHINE SULFATE 15 MG PO TABS: 15.0000 mg | ORAL_TABLET | Freq: Four times a day (QID) | ORAL | 0 refills | Status: DC | PRN

## 2019-10-05 ENCOUNTER — Ambulatory Visit (INDEPENDENT_AMBULATORY_CARE_PROVIDER_SITE_OTHER): Payer: 59 | Admitting: Psychology

## 2019-10-05 DIAGNOSIS — F4310 Post-traumatic stress disorder, unspecified: Secondary | ICD-10-CM

## 2019-10-11 ENCOUNTER — Encounter
Payer: Managed Care, Other (non HMO) | Attending: Physical Medicine & Rehabilitation | Admitting: Physical Medicine & Rehabilitation

## 2019-10-11 ENCOUNTER — Encounter: Payer: Self-pay | Admitting: Physical Medicine & Rehabilitation

## 2019-10-11 ENCOUNTER — Other Ambulatory Visit: Payer: Self-pay

## 2019-10-11 DIAGNOSIS — F4323 Adjustment disorder with mixed anxiety and depressed mood: Secondary | ICD-10-CM | POA: Insufficient documentation

## 2019-10-11 DIAGNOSIS — G8250 Quadriplegia, unspecified: Secondary | ICD-10-CM | POA: Insufficient documentation

## 2019-10-11 DIAGNOSIS — Z79891 Long term (current) use of opiate analgesic: Secondary | ICD-10-CM | POA: Insufficient documentation

## 2019-10-11 DIAGNOSIS — M4722 Other spondylosis with radiculopathy, cervical region: Secondary | ICD-10-CM | POA: Insufficient documentation

## 2019-10-11 DIAGNOSIS — G894 Chronic pain syndrome: Secondary | ICD-10-CM | POA: Insufficient documentation

## 2019-10-11 DIAGNOSIS — K592 Neurogenic bowel, not elsewhere classified: Secondary | ICD-10-CM | POA: Insufficient documentation

## 2019-10-11 DIAGNOSIS — M5416 Radiculopathy, lumbar region: Secondary | ICD-10-CM | POA: Diagnosis present

## 2019-10-11 DIAGNOSIS — S14105S Unspecified injury at C5 level of cervical spinal cord, sequela: Secondary | ICD-10-CM | POA: Diagnosis present

## 2019-10-11 DIAGNOSIS — Z5181 Encounter for therapeutic drug level monitoring: Secondary | ICD-10-CM | POA: Insufficient documentation

## 2019-10-11 MED ORDER — MORPHINE SULFATE 15 MG PO TABS: 15.0000 mg | ORAL_TABLET | Freq: Four times a day (QID) | ORAL | 0 refills | Status: DC | PRN

## 2019-10-11 NOTE — Patient Instructions (Signed)
PLEASE FEEL FREE TO CALL OUR OFFICE WITH ANY PROBLEMS OR QUESTIONS ([phone])      

## 2019-10-11 NOTE — Progress Notes (Signed)
Botox Injection for spasticity using needle EMG guidance Indication: Chronic pain syndrome - Plan: Drug Tox Alc Metab w/Con, Oral Fld, Drug Tox Monitor 1 w/Conf, Oral Fld  Encounter for long-term use of opiate analgesic - Plan: Drug Tox Alc Metab w/Con, Oral Fld, Drug Tox Monitor 1 w/Conf, Oral Fld  Encounter for medication monitoring  Encounter for therapeutic drug monitoring - Plan: Drug Tox Alc Metab w/Con, Oral Fld, Drug Tox Monitor 1 w/Conf, Oral Fld   Dilution: 100 Units/ml        Total Units Injected: 400 Indication: Severe spasticity which interferes with ADL,mobility and/or  hygiene and is unresponsive to medication management and other conservative care Informed consent was obtained after describing risks and benefits of the procedure with the patient. This includes bleeding, bruising, infection, excessive weakness, or medication side effects. A REMS form is on file and signed.  Needle: 48mm injectable monopolar needle electrode  bilateral Number of units per muscle Pectoralis Major 0 units Pectoralis Minor 0 units Biceps 0 units Brachioradialis 0 units FCR 10 units right 0 left FCU 10 units right and 0 left FDS 80 units right and 40 u left FDP 80 units right and 40u left Palmaris longus 40 right and 30 u left FPL 0 units Pronator Teres 0 units Pronator Quadratus 0 units Lumbricals 80 units left hand All injections were done after obtaining appropriate EMG activity and after negative drawback for blood. The patient tolerated the procedure well. Post procedure instructions were given. No follow-ups on file.

## 2019-10-15 LAB — DRUG TOX MONITOR 1 W/CONF, ORAL FLD

## 2019-10-15 LAB — DRUG TOX ALC METAB W/CON, ORAL FLD: Alcohol Metabolite: NEGATIVE ng/mL (ref <25)

## 2019-10-17 ENCOUNTER — Ambulatory Visit: Payer: 59 | Admitting: Psychology

## 2019-10-18 ENCOUNTER — Other Ambulatory Visit: Payer: Self-pay | Admitting: Physician Assistant

## 2019-10-18 DIAGNOSIS — R519 Headache, unspecified: Secondary | ICD-10-CM

## 2019-10-18 DIAGNOSIS — S14105S Unspecified injury at C5 level of cervical spinal cord, sequela: Secondary | ICD-10-CM

## 2019-10-19 ENCOUNTER — Telehealth: Payer: Self-pay | Admitting: Physician Assistant

## 2019-10-19 NOTE — Telephone Encounter (Signed)
10/18/19~Per patient & husband/Brandon, patient has BACLOFEN IMPLANT. Msg sent to [facility] to schd, per IMPLANTED DEVICE. Patient was contacted via Primary #/Mobile [phone], per Epic. MF

## 2019-10-24 ENCOUNTER — Ambulatory Visit (INDEPENDENT_AMBULATORY_CARE_PROVIDER_SITE_OTHER): Payer: 59 | Admitting: Psychology

## 2019-10-24 DIAGNOSIS — F4310 Post-traumatic stress disorder, unspecified: Secondary | ICD-10-CM | POA: Diagnosis not present

## 2019-10-25 ENCOUNTER — Telehealth: Payer: Self-pay | Admitting: *Deleted

## 2019-10-25 NOTE — Telephone Encounter (Signed)
Oral swab drug screen was consistent for prescribed medications.  ?

## 2019-10-31 ENCOUNTER — Ambulatory Visit: Payer: 59 | Admitting: Psychology

## 2019-10-31 ENCOUNTER — Other Ambulatory Visit: Payer: Self-pay | Admitting: *Deleted

## 2019-10-31 ENCOUNTER — Ambulatory Visit (INDEPENDENT_AMBULATORY_CARE_PROVIDER_SITE_OTHER): Payer: 59 | Admitting: Psychology

## 2019-10-31 DIAGNOSIS — F430 Acute stress reaction: Secondary | ICD-10-CM

## 2019-10-31 MED ORDER — PREGABALIN 150 MG PO CAPS: 150.0000 mg | ORAL_CAPSULE | Freq: Three times a day (TID) | ORAL | 4 refills | Status: DC

## 2019-11-01 ENCOUNTER — Other Ambulatory Visit: Payer: Self-pay | Admitting: Specialist

## 2019-11-01 ENCOUNTER — Other Ambulatory Visit
Admission: RE | Admit: 2019-11-01 | Discharge: 2019-11-01 | Disposition: A | Discharge status: Home / Self Care | Payer: Managed Care, Other (non HMO) | Source: Ambulatory Visit | Attending: Specialist | Admitting: Specialist

## 2019-11-01 DIAGNOSIS — R0902 Hypoxemia: Secondary | ICD-10-CM | POA: Insufficient documentation

## 2019-11-01 DIAGNOSIS — R0602 Shortness of breath: Secondary | ICD-10-CM

## 2019-11-01 LAB — FIBRIN DERIVATIVES D-DIMER (ARMC ONLY): Fibrin derivatives D-dimer (ARMC): 511.1 ng/mL (FEU) — ABNORMAL HIGH (ref 0.00–499.00)

## 2019-11-09 ENCOUNTER — Ambulatory Visit (INDEPENDENT_AMBULATORY_CARE_PROVIDER_SITE_OTHER): Payer: 59 | Admitting: Psychology

## 2019-11-09 DIAGNOSIS — F4310 Post-traumatic stress disorder, unspecified: Secondary | ICD-10-CM | POA: Diagnosis not present

## 2019-11-14 ENCOUNTER — Ambulatory Visit (INDEPENDENT_AMBULATORY_CARE_PROVIDER_SITE_OTHER): Payer: 59 | Admitting: Psychology

## 2019-11-14 ENCOUNTER — Ambulatory Visit: Payer: 59 | Admitting: Psychology

## 2019-11-14 DIAGNOSIS — F4310 Post-traumatic stress disorder, unspecified: Secondary | ICD-10-CM

## 2019-11-21 ENCOUNTER — Ambulatory Visit (INDEPENDENT_AMBULATORY_CARE_PROVIDER_SITE_OTHER): Payer: 59 | Admitting: Psychology

## 2019-11-21 DIAGNOSIS — F4310 Post-traumatic stress disorder, unspecified: Secondary | ICD-10-CM

## 2019-11-28 ENCOUNTER — Ambulatory Visit (INDEPENDENT_AMBULATORY_CARE_PROVIDER_SITE_OTHER): Payer: 59 | Admitting: Psychology

## 2019-11-28 ENCOUNTER — Ambulatory Visit: Payer: 59 | Admitting: Psychology

## 2019-11-28 ENCOUNTER — Other Ambulatory Visit: Payer: Self-pay | Admitting: Physical Medicine & Rehabilitation

## 2019-11-28 DIAGNOSIS — G8250 Quadriplegia, unspecified: Secondary | ICD-10-CM

## 2019-11-28 DIAGNOSIS — F4310 Post-traumatic stress disorder, unspecified: Secondary | ICD-10-CM | POA: Diagnosis not present

## 2019-11-28 DIAGNOSIS — S14105S Unspecified injury at C5 level of cervical spinal cord, sequela: Secondary | ICD-10-CM

## 2019-11-28 DIAGNOSIS — Z5181 Encounter for therapeutic drug level monitoring: Secondary | ICD-10-CM

## 2019-11-28 DIAGNOSIS — M4722 Other spondylosis with radiculopathy, cervical region: Secondary | ICD-10-CM

## 2019-11-28 DIAGNOSIS — M5416 Radiculopathy, lumbar region: Secondary | ICD-10-CM

## 2019-11-28 DIAGNOSIS — M4802 Spinal stenosis, cervical region: Secondary | ICD-10-CM

## 2019-11-30 ENCOUNTER — Other Ambulatory Visit: Payer: Self-pay

## 2019-11-30 ENCOUNTER — Ambulatory Visit (INDEPENDENT_AMBULATORY_CARE_PROVIDER_SITE_OTHER): Payer: 59 | Admitting: Psychology

## 2019-11-30 DIAGNOSIS — M5416 Radiculopathy, lumbar region: Secondary | ICD-10-CM

## 2019-11-30 DIAGNOSIS — G8250 Quadriplegia, unspecified: Secondary | ICD-10-CM

## 2019-11-30 DIAGNOSIS — K592 Neurogenic bowel, not elsewhere classified: Secondary | ICD-10-CM

## 2019-11-30 DIAGNOSIS — M4722 Other spondylosis with radiculopathy, cervical region: Secondary | ICD-10-CM

## 2019-11-30 DIAGNOSIS — F4310 Post-traumatic stress disorder, unspecified: Secondary | ICD-10-CM

## 2019-11-30 DIAGNOSIS — S14105S Unspecified injury at C5 level of cervical spinal cord, sequela: Secondary | ICD-10-CM

## 2019-11-30 MED ORDER — MORPHINE SULFATE 15 MG PO TABS: 15.0000 mg | ORAL_TABLET | Freq: Four times a day (QID) | ORAL | 0 refills | Status: DC | PRN

## 2019-11-30 NOTE — Telephone Encounter (Signed)
PMP was reviewed. [facility] was called no prescription was on hold. MSIR was e-scribed today. Placed a call to Jasmine [name] regarding the above, no answer. Left message to return the call with any questions.

## 2019-12-04 ENCOUNTER — Other Ambulatory Visit (HOSPITAL_COMMUNITY)
Admission: RE | Admit: 2019-12-04 | Discharge: 2019-12-04 | Disposition: A | Discharge status: Home / Self Care | Payer: Managed Care, Other (non HMO) | Source: Ambulatory Visit | Attending: Obstetrics and Gynecology | Admitting: Obstetrics and Gynecology

## 2019-12-04 ENCOUNTER — Ambulatory Visit (INDEPENDENT_AMBULATORY_CARE_PROVIDER_SITE_OTHER): Payer: Managed Care, Other (non HMO) | Admitting: Obstetrics and Gynecology

## 2019-12-04 ENCOUNTER — Encounter: Payer: Self-pay | Admitting: Obstetrics and Gynecology

## 2019-12-04 ENCOUNTER — Other Ambulatory Visit: Payer: Self-pay

## 2019-12-04 VITALS — BP 124/74 | HR 92 | Resp 18

## 2019-12-04 DIAGNOSIS — N6320 Unspecified lump in the left breast, unspecified quadrant: Secondary | ICD-10-CM | POA: Diagnosis not present

## 2019-12-04 DIAGNOSIS — Z01419 Encounter for gynecological examination (general) (routine) without abnormal findings: Secondary | ICD-10-CM

## 2019-12-04 DIAGNOSIS — Z1231 Encounter for screening mammogram for malignant neoplasm of breast: Secondary | ICD-10-CM

## 2019-12-04 DIAGNOSIS — Z124 Encounter for screening for malignant neoplasm of cervix: Secondary | ICD-10-CM | POA: Diagnosis not present

## 2019-12-04 DIAGNOSIS — L732 Hidradenitis suppurativa: Secondary | ICD-10-CM

## 2019-12-04 DIAGNOSIS — Z0000 Encounter for general adult medical examination without abnormal findings: Secondary | ICD-10-CM

## 2019-12-04 DIAGNOSIS — B373 Candidiasis of vulva and vagina: Secondary | ICD-10-CM

## 2019-12-04 DIAGNOSIS — B3731 Acute candidiasis of vulva and vagina: Secondary | ICD-10-CM

## 2019-12-04 DIAGNOSIS — N926 Irregular menstruation, unspecified: Secondary | ICD-10-CM

## 2019-12-04 DIAGNOSIS — N921 Excessive and frequent menstruation with irregular cycle: Secondary | ICD-10-CM

## 2019-12-04 MED ORDER — FLUCONAZOLE 150 MG PO TABS: 150.0000 mg | ORAL_TABLET | ORAL | 0 refills | Status: AC

## 2019-12-04 NOTE — Patient Instructions (Addendum)
Hibiclens soap to wash armpits Interdry- fabric with silver impregnated for fold os skin    Hidradenitis Suppurativa Hidradenitis suppurativa is a long-term (chronic) skin disease. It is similar to a severe form of acne, but it affects areas of the body where acne would be unusual, especially areas of the body where skin rubs against skin and becomes moist. These include:  Underarms.  Groin.  Genital area.  Buttocks.  Upper thighs.  Breasts. Hidradenitis suppurativa may start out as small lumps or pimples caused by blocked sweat glands or hair follicles. Pimples may develop into deep sores that break open (rupture) and drain pus. Over time, affected areas of skin may thicken and become scarred. This condition is rare and does not spread from person to person (non-contagious). What are the causes? The exact cause of this condition is not known. It may be related to:  Female and female hormones.  An overactive disease-fighting system (immune system). The immune system may over-react to blocked hair follicles or sweat glands and cause swelling and pus-filled sores. What increases the risk? You are more likely to develop this condition if you:  Are female.  Are 90-57 years old.  Have a family history of hidradenitis suppurativa.  Have a personal history of acne.  Are overweight.  Smoke.  Take the medicine lithium. What are the signs or symptoms? The first symptoms are usually painful bumps in the skin, similar to pimples. The condition may get worse over time (progress), or it may only cause mild symptoms. If the disease progresses, symptoms may include:  Skin bumps getting bigger and growing deeper into the skin.  Bumps rupturing and draining pus.  Itchy, infected skin.  Skin getting thicker and scarred.  Tunnels under the skin (fistulas) where pus drains from a bump.  Pain during daily activities, such as pain during walking if your groin area is  affected.  Emotional problems, such as stress or depression. This condition may affect your appearance and your ability or willingness to wear certain clothes or do certain activities. How is this diagnosed? This condition is diagnosed by a health care provider who specializes in skin diseases (dermatologist). You may be diagnosed based on:  Your symptoms and medical history.  A physical exam.  Testing a pus sample for infection.  Blood tests. How is this treated? Your treatment will depend on how severe your symptoms are. The same treatment will not work for everybody with this condition. You may need to try several treatments to find what works best for you. Treatment may include:  Cleaning and bandaging (dressing) your wounds as needed.  Lifestyle changes, such as new skin care routines.  Taking medicines, such as: ? Antibiotics. ? Acne medicines. ? Medicines to reduce the activity of the immune system. ? A diabetes medicine (metformin). ? Birth control pills, for women. ? Steroids to reduce swelling and pain.  Working with a mental health care provider, if you experience emotional distress due to this condition. If you have severe symptoms that do not get better with medicine, you may need surgery. Surgery may involve:  Using a laser to clear the skin and remove hair follicles.  Opening and draining deep sores.  Removing the areas of skin that are diseased and scarred. Follow these instructions at home: Medicines   Take over-the-counter and prescription medicines only as told by your health care provider.  If you were prescribed an antibiotic medicine, take it as told by your health care provider. Do not stop  taking the antibiotic even if your condition improves. Skin care  If you have open wounds, cover them with a clean dressing as told by your health care provider. Keep wounds clean by washing them gently with soap and water when you bathe.  Do not shave the  areas where you get hidradenitis suppurativa.  Do not wear deodorant.  Wear loose-fitting clothes.  Try to avoid getting overheated or sweaty. If you get sweaty or wet, change into clean, dry clothes as soon as you can.  To help relieve pain and itchiness, cover sore areas with a warm, clean washcloth (warm compress) for 5-10 minutes as often as needed.  If told by your health care provider, take a bleach bath twice a week: ? Fill your bathtub halfway with water. ? Pour in  cup of unscented household bleach. ? Soak in the tub for 5-10 minutes. ? Only soak from the neck down. Avoid water on your face and hair. ? Shower to rinse off the bleach from your skin. General instructions  Learn as much as you can about your disease so that you have an active role in your treatment. Work closely with your health care provider to find treatments that work for you.  If you are overweight, work with your health care provider to lose weight as recommended.  Do not use any products that contain nicotine or tobacco, such as cigarettes and e-cigarettes. If you need help quitting, ask your health care provider.  If you struggle with living with this condition, talk with your health care provider or work with a mental health care provider as recommended.  Keep all follow-up visits as told by your health care provider. This is important. Where to find more information  Hidradenitis Suppurativa Foundation, Inc.: https://www.hs-foundation.org/ Contact a health care provider if you have:  A flare-up of hidradenitis suppurativa.  A fever or chills.  Trouble controlling your symptoms at home.  Trouble doing your daily activities because of your symptoms.  Trouble dealing with emotional problems related to your condition. Summary  Hidradenitis suppurativa is a long-term (chronic) skin disease. It is similar to a severe form of acne, but it affects areas of the body where acne would be  unusual.  The first symptoms are usually painful bumps in the skin, similar to pimples. The condition may get worse over time (progress), or it may only cause mild symptoms.  If you have open wounds, cover them with a clean dressing as told by your health care provider. Keep wounds clean by washing them gently with soap and water when you bathe.  Besides skin care, treatment may include medicines, laser treatment, and surgery. This information is not intended to replace advice given to you by your health care provider. Make sure you discuss any questions you have with your health care provider. Document Revised: 06/02/2017 Document Reviewed: 06/02/2017 Elsevier Patient Education  2020 [employer].   Etonogestrel implant What is this medicine? ETONOGESTREL (et oh noe JES trel) is a contraceptive (birth control) device. It is used to prevent pregnancy. It can be used for up to 3 years. This medicine may be used for other purposes; ask your health care provider or pharmacist if you have questions. COMMON BRAND NAME(S): Implanon, Nexplanon What should I tell my health care provider before I take this medicine? They need to know if you have any of these conditions:  abnormal vaginal bleeding  blood vessel disease or blood clots  breast, cervical, endometrial, ovarian, liver, or uterine  cancer  diabetes  gallbladder disease  heart disease or recent heart attack  high blood pressure  high cholesterol or triglycerides  kidney disease  liver disease  migraine headaches  seizures  stroke  tobacco smoker  an unusual or allergic reaction to etonogestrel, anesthetics or antiseptics, other medicines, foods, dyes, or preservatives  pregnant or trying to get pregnant  breast-feeding How should I use this medicine? This device is inserted just under the skin on the inner side of your upper arm by a health care professional. Talk to your pediatrician regarding the use of this  medicine in children. Special care may be needed. Overdosage: If you think you have taken too much of this medicine contact a poison control center or emergency room at once. NOTE: This medicine is only for you. Do not share this medicine with others. What if I miss a dose? This does not apply. What may interact with this medicine? Do not take this medicine with any of the following medications:  amprenavir  fosamprenavir This medicine may also interact with the following medications:  acitretin  aprepitant  armodafinil  bexarotene  bosentan  carbamazepine  certain medicines for fungal infections like fluconazole, ketoconazole, itraconazole and voriconazole  certain medicines to treat hepatitis, HIV or AIDS  cyclosporine  felbamate  griseofulvin  lamotrigine  modafinil  oxcarbazepine  phenobarbital  phenytoin  primidone  rifabutin  rifampin  rifapentine  St. John's wort  topiramate This list may not describe all possible interactions. Give your health care provider a list of all the medicines, herbs, non-prescription drugs, or dietary supplements you use. Also tell them if you smoke, drink alcohol, or use illegal drugs. Some items may interact with your medicine. What should I watch for while using this medicine? This product does not protect you against HIV infection (AIDS) or other sexually transmitted diseases. You should be able to feel the implant by pressing your fingertips over the skin where it was inserted. Contact your doctor if you cannot feel the implant, and use a non-hormonal birth control method (such as condoms) until your doctor confirms that the implant is in place. Contact your doctor if you think that the implant may have broken or become bent while in your arm. You will receive a user card from your health care provider after the implant is inserted. The card is a record of the location of the implant in your upper arm and when it  should be removed. Keep this card with your health records. What side effects may I notice from receiving this medicine? Side effects that you should report to your doctor or health care professional as soon as possible:  allergic reactions like skin rash, itching or hives, swelling of the face, lips, or tongue  breast lumps, breast tissue changes, or discharge  breathing problems  changes in emotions or moods  coughing up blood  if you feel that the implant may have broken or bent while in your arm  high blood pressure  pain, irritation, swelling, or bruising at the insertion site  scar at site of insertion  signs of infection at the insertion site such as fever, and skin redness, pain or discharge  signs and symptoms of a blood clot such as breathing problems; changes in vision; chest pain; severe, sudden headache; pain, swelling, warmth in the leg; trouble speaking; sudden numbness or weakness of the face, arm or leg  signs and symptoms of liver injury like dark yellow or brown urine; general ill  feeling or flu-like symptoms; light-colored stools; loss of appetite; nausea; right upper belly pain; unusually weak or tired; yellowing of the eyes or skin  unusual vaginal bleeding, discharge Side effects that usually do not require medical attention (report to your doctor or health care professional if they continue or are bothersome):  acne  breast pain or tenderness  headache  irregular menstrual bleeding  nausea This list may not describe all possible side effects. Call your doctor for medical advice about side effects. You may report side effects to FDA at 1-800-FDA-1088. Where should I keep my medicine? This drug is given in a hospital or clinic and will not be stored at home. NOTE: This sheet is a summary. It may not cover all possible information. If you have questions about this medicine, talk to your doctor, pharmacist, or health care provider.  2020 Elsevier/Gold  Standard (2019-03-07 11:33:04)

## 2019-12-06 ENCOUNTER — Other Ambulatory Visit: Payer: Self-pay

## 2019-12-06 ENCOUNTER — Encounter
Payer: Managed Care, Other (non HMO) | Attending: Physical Medicine & Rehabilitation | Admitting: Physical Medicine & Rehabilitation

## 2019-12-06 ENCOUNTER — Encounter: Payer: Self-pay | Admitting: Physical Medicine & Rehabilitation

## 2019-12-06 DIAGNOSIS — K592 Neurogenic bowel, not elsewhere classified: Secondary | ICD-10-CM | POA: Insufficient documentation

## 2019-12-06 DIAGNOSIS — S14105S Unspecified injury at C5 level of cervical spinal cord, sequela: Secondary | ICD-10-CM | POA: Diagnosis not present

## 2019-12-06 DIAGNOSIS — Z79891 Long term (current) use of opiate analgesic: Secondary | ICD-10-CM | POA: Insufficient documentation

## 2019-12-06 DIAGNOSIS — G8250 Quadriplegia, unspecified: Secondary | ICD-10-CM | POA: Diagnosis not present

## 2019-12-06 DIAGNOSIS — M5416 Radiculopathy, lumbar region: Secondary | ICD-10-CM | POA: Insufficient documentation

## 2019-12-06 DIAGNOSIS — G894 Chronic pain syndrome: Secondary | ICD-10-CM | POA: Insufficient documentation

## 2019-12-06 DIAGNOSIS — M4722 Other spondylosis with radiculopathy, cervical region: Secondary | ICD-10-CM | POA: Insufficient documentation

## 2019-12-06 DIAGNOSIS — Z5181 Encounter for therapeutic drug level monitoring: Secondary | ICD-10-CM | POA: Insufficient documentation

## 2019-12-06 DIAGNOSIS — F4323 Adjustment disorder with mixed anxiety and depressed mood: Secondary | ICD-10-CM | POA: Insufficient documentation
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
## 2019-12-07 ENCOUNTER — Encounter: Payer: Self-pay | Admitting: Obstetrics and Gynecology

## 2019-12-07 ENCOUNTER — Ambulatory Visit: Payer: 59 | Admitting: Psychology

## 2019-12-07 LAB — CYTOLOGY - PAP
Comment: NEGATIVE
Diagnosis: NEGATIVE
High risk HPV: NEGATIVE

## 2019-12-12 ENCOUNTER — Ambulatory Visit (INDEPENDENT_AMBULATORY_CARE_PROVIDER_SITE_OTHER): Payer: 59 | Admitting: Psychology

## 2019-12-12 ENCOUNTER — Ambulatory Visit: Payer: 59 | Admitting: Psychology

## 2019-12-12 DIAGNOSIS — F4310 Post-traumatic stress disorder, unspecified: Secondary | ICD-10-CM

## 2019-12-14 ENCOUNTER — Ambulatory Visit (INDEPENDENT_AMBULATORY_CARE_PROVIDER_SITE_OTHER): Payer: 59 | Admitting: Psychology

## 2019-12-14 DIAGNOSIS — F4310 Post-traumatic stress disorder, unspecified: Secondary | ICD-10-CM | POA: Diagnosis not present

## 2019-12-15 ENCOUNTER — Ambulatory Visit
Admission: RE | Admit: 2019-12-15 | Discharge: 2019-12-15 | Disposition: A | Discharge status: Home / Self Care | Payer: Managed Care, Other (non HMO) | Source: Ambulatory Visit | Attending: Obstetrics and Gynecology | Admitting: Obstetrics and Gynecology

## 2019-12-15 ENCOUNTER — Telehealth: Payer: Self-pay

## 2019-12-15 DIAGNOSIS — N6320 Unspecified lump in the left breast, unspecified quadrant: Secondary | ICD-10-CM | POA: Insufficient documentation

## 2019-12-15 DIAGNOSIS — Z1231 Encounter for screening mammogram for malignant neoplasm of breast: Secondary | ICD-10-CM

## 2019-12-15 NOTE — Telephone Encounter (Signed)
Pt was advised of her labs. 

## 2019-12-21 ENCOUNTER — Ambulatory Visit (INDEPENDENT_AMBULATORY_CARE_PROVIDER_SITE_OTHER): Payer: 59 | Admitting: Psychology

## 2019-12-21 ENCOUNTER — Other Ambulatory Visit: Payer: Self-pay | Admitting: Physical Medicine & Rehabilitation

## 2019-12-21 DIAGNOSIS — F4310 Post-traumatic stress disorder, unspecified: Secondary | ICD-10-CM

## 2019-12-21 DIAGNOSIS — S14105S Unspecified injury at C5 level of cervical spinal cord, sequela: Secondary | ICD-10-CM

## 2019-12-21 DIAGNOSIS — M4802 Spinal stenosis, cervical region: Secondary | ICD-10-CM

## 2019-12-21 DIAGNOSIS — M5416 Radiculopathy, lumbar region: Secondary | ICD-10-CM

## 2019-12-21 DIAGNOSIS — G8250 Quadriplegia, unspecified: Secondary | ICD-10-CM

## 2019-12-21 DIAGNOSIS — Z5181 Encounter for therapeutic drug level monitoring: Secondary | ICD-10-CM

## 2019-12-21 DIAGNOSIS — M4722 Other spondylosis with radiculopathy, cervical region: Secondary | ICD-10-CM

## 2019-12-26 ENCOUNTER — Ambulatory Visit (INDEPENDENT_AMBULATORY_CARE_PROVIDER_SITE_OTHER): Payer: 59 | Admitting: Psychology

## 2019-12-26 ENCOUNTER — Ambulatory Visit: Payer: 59 | Admitting: Psychology

## 2019-12-26 DIAGNOSIS — F4310 Post-traumatic stress disorder, unspecified: Secondary | ICD-10-CM | POA: Diagnosis not present

## 2019-12-28 ENCOUNTER — Ambulatory Visit (INDEPENDENT_AMBULATORY_CARE_PROVIDER_SITE_OTHER): Payer: 59 | Admitting: Psychology

## 2019-12-28 DIAGNOSIS — F4310 Post-traumatic stress disorder, unspecified: Secondary | ICD-10-CM

## 2019-12-30 ENCOUNTER — Other Ambulatory Visit: Payer: Self-pay

## 2019-12-30 DIAGNOSIS — G8250 Quadriplegia, unspecified: Secondary | ICD-10-CM

## 2019-12-30 DIAGNOSIS — K592 Neurogenic bowel, not elsewhere classified: Secondary | ICD-10-CM

## 2019-12-30 DIAGNOSIS — M5416 Radiculopathy, lumbar region: Secondary | ICD-10-CM

## 2019-12-30 DIAGNOSIS — S14105S Unspecified injury at C5 level of cervical spinal cord, sequela: Secondary | ICD-10-CM

## 2019-12-30 DIAGNOSIS — M4722 Other spondylosis with radiculopathy, cervical region: Secondary | ICD-10-CM

## 2020-01-01 MED ORDER — MORPHINE SULFATE 15 MG PO TABS: 15.0000 mg | ORAL_TABLET | Freq: Four times a day (QID) | ORAL | 0 refills | Status: DC | PRN

## 2020-01-01 NOTE — Telephone Encounter (Signed)
PMP was Reviewed; MSIR was refilled. Left a message for Ms. Martindelcampo regarding the above.

## 2020-01-02 ENCOUNTER — Ambulatory Visit: Payer: Managed Care, Other (non HMO) | Admitting: Obstetrics and Gynecology

## 2020-01-04 ENCOUNTER — Ambulatory Visit (INDEPENDENT_AMBULATORY_CARE_PROVIDER_SITE_OTHER): Payer: 59 | Admitting: Psychology

## 2020-01-04 DIAGNOSIS — F4310 Post-traumatic stress disorder, unspecified: Secondary | ICD-10-CM | POA: Diagnosis not present

## 2020-01-09 ENCOUNTER — Ambulatory Visit: Payer: 59 | Admitting: Psychology

## 2020-01-11 ENCOUNTER — Telehealth (HOSPITAL_COMMUNITY): Payer: Self-pay | Admitting: Physical Medicine & Rehabilitation

## 2020-01-11 ENCOUNTER — Ambulatory Visit (INDEPENDENT_AMBULATORY_CARE_PROVIDER_SITE_OTHER): Payer: 59 | Admitting: Psychology

## 2020-01-11 DIAGNOSIS — F4310 Post-traumatic stress disorder, unspecified: Secondary | ICD-10-CM

## 2020-01-11 NOTE — Telephone Encounter (Signed)
Patient called yesterday PM, still in severe pain and wanted to know her results. She left a message on my voicemail.

## 2020-01-15 ENCOUNTER — Other Ambulatory Visit: Payer: Self-pay | Admitting: Physical Medicine & Rehabilitation

## 2020-01-18 ENCOUNTER — Ambulatory Visit (INDEPENDENT_AMBULATORY_CARE_PROVIDER_SITE_OTHER): Payer: 59 | Admitting: Psychology

## 2020-01-18 DIAGNOSIS — F4310 Post-traumatic stress disorder, unspecified: Secondary | ICD-10-CM

## 2020-01-19 ENCOUNTER — Other Ambulatory Visit: Payer: Self-pay

## 2020-01-19 ENCOUNTER — Ambulatory Visit (INDEPENDENT_AMBULATORY_CARE_PROVIDER_SITE_OTHER): Payer: Managed Care, Other (non HMO) | Admitting: Obstetrics and Gynecology

## 2020-01-19 ENCOUNTER — Encounter: Payer: Self-pay | Admitting: Obstetrics and Gynecology

## 2020-01-19 VITALS — HR 88 | Resp 18

## 2020-01-19 DIAGNOSIS — N921 Excessive and frequent menstruation with irregular cycle: Secondary | ICD-10-CM

## 2020-01-19 DIAGNOSIS — B373 Candidiasis of vulva and vagina: Secondary | ICD-10-CM

## 2020-01-19 DIAGNOSIS — B3731 Acute candidiasis of vulva and vagina: Secondary | ICD-10-CM
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
## 2020-01-23 ENCOUNTER — Ambulatory Visit: Payer: 59 | Admitting: Psychology

## 2020-01-31 ENCOUNTER — Encounter
Payer: Managed Care, Other (non HMO) | Attending: Physical Medicine & Rehabilitation | Admitting: Physical Medicine & Rehabilitation

## 2020-01-31 ENCOUNTER — Other Ambulatory Visit: Payer: Self-pay

## 2020-01-31 ENCOUNTER — Encounter: Payer: Self-pay | Admitting: Physical Medicine & Rehabilitation

## 2020-01-31 VITALS — BP 113/73 | HR 93 | Temp 97.8°F

## 2020-01-31 DIAGNOSIS — G8250 Quadriplegia, unspecified: Secondary | ICD-10-CM | POA: Diagnosis present

## 2020-01-31 DIAGNOSIS — M4722 Other spondylosis with radiculopathy, cervical region: Secondary | ICD-10-CM | POA: Diagnosis not present

## 2020-01-31 DIAGNOSIS — Z5181 Encounter for therapeutic drug level monitoring: Secondary | ICD-10-CM | POA: Insufficient documentation

## 2020-01-31 DIAGNOSIS — G894 Chronic pain syndrome: Secondary | ICD-10-CM | POA: Insufficient documentation

## 2020-01-31 DIAGNOSIS — M5416 Radiculopathy, lumbar region: Secondary | ICD-10-CM | POA: Diagnosis not present

## 2020-01-31 DIAGNOSIS — K592 Neurogenic bowel, not elsewhere classified: Secondary | ICD-10-CM | POA: Diagnosis present

## 2020-01-31 DIAGNOSIS — Z79891 Long term (current) use of opiate analgesic: Secondary | ICD-10-CM | POA: Diagnosis present

## 2020-01-31 DIAGNOSIS — F4323 Adjustment disorder with mixed anxiety and depressed mood: Secondary | ICD-10-CM | POA: Diagnosis present

## 2020-01-31 DIAGNOSIS — S14105S Unspecified injury at C5 level of cervical spinal cord, sequela: Secondary | ICD-10-CM | POA: Diagnosis not present

## 2020-01-31 MED ORDER — NORTRIPTYLINE HCL 50 MG PO CAPS: 50.0000 mg | ORAL_CAPSULE | Freq: Every day | ORAL | 3 refills | Status: DC

## 2020-01-31 MED ORDER — MORPHINE SULFATE 15 MG PO TABS: 15.0000 mg | ORAL_TABLET | Freq: Four times a day (QID) | ORAL | 0 refills | Status: DC | PRN

## 2020-01-31 NOTE — Patient Instructions (Signed)
PLEASE FEEL FREE TO CALL OUR OFFICE WITH ANY PROBLEMS OR QUESTIONS ([phone])      

## 2020-01-31 NOTE — Progress Notes (Signed)
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-C7 level without injury of spinal bone, sequela (HCC) - Plan: morphine (MSIR) 15 MG tablet  Chronic lumbar radiculopathy - Plan: morphine (MSIR) 15 MG tablet  Cervical radiculopathy due to degenerative joint disease of spine - Plan: morphine (MSIR) 15 MG tablet  Neurogenic bowel - Plan: morphine (MSIR) 15 MG tablet   Dilution: 100 Units/ml        Total Units Injected: 600 Indication: Severe spasticity which interferes with ADL,mobility and/or  hygiene and is unresponsive to medication management and other conservative care Informed consent was obtained after describing risks and benefits of the procedure with the patient. This includes bleeding, bruising, infection, excessive weakness, or medication side effects. A REMS form is on file and signed. G82.50    Needle: 52mm injectable monopolar needle electrode  Number of units per muscle Pectoralis Major 0 units Pectoralis Minor 0 units Biceps 0 units Brachioradialis 0 units Right FCR 25 units Left 25u Right FCU 25 units Left 25u Right FDS 75 units Left 75 u Right FDP 75 units Left 75 u FPL 0 units Pronator Teres 0 units Pronator Quadratus 0 units Right Lumbricals (3) 50 units, Left Lumbricals 75 u Quadriceps right 75 units Gastroc/soleus 0 units Hamstrings 0 units Tibialis Posterior 0 units Tibialis Anterior 0 units EHL 0 units All injections were done after obtaining appropriate EMG activity and after negative drawback for blood. The patient tolerated the procedure well. Post procedure instructions were given. Return in about 2 months (around 04/01/2020).   Decided to increase nortriptyline for neuropathic pain to 50u

## 2020-02-05 ENCOUNTER — Other Ambulatory Visit: Payer: Self-pay | Admitting: Physical Medicine & Rehabilitation

## 2020-02-05 DIAGNOSIS — F4323 Adjustment disorder with mixed anxiety and depressed mood: Secondary | ICD-10-CM

## 2020-02-06 ENCOUNTER — Ambulatory Visit: Payer: 59 | Admitting: Psychology

## 2020-02-06 ENCOUNTER — Ambulatory Visit (INDEPENDENT_AMBULATORY_CARE_PROVIDER_SITE_OTHER): Payer: 59 | Admitting: Psychology

## 2020-02-06 DIAGNOSIS — F4310 Post-traumatic stress disorder, unspecified: Secondary | ICD-10-CM | POA: Diagnosis not present

## 2020-02-08 ENCOUNTER — Ambulatory Visit: Payer: 59 | Admitting: Psychology

## 2020-02-13 ENCOUNTER — Telehealth: Payer: Self-pay

## 2020-02-13 DIAGNOSIS — R339 Retention of urine, unspecified: Secondary | ICD-10-CM

## 2020-02-13 NOTE — Telephone Encounter (Signed)
Per husband ptn has no current Catheter Rx - he contacted [facility] -not sure if they will send or if you issue

## 2020-02-14 MED ORDER — CATHETERS MISC: 1.0000 | Freq: Every day | 12 refills | Status: DC

## 2020-02-14 MED ORDER — CATHETERS MISC: 12 refills | Status: AC

## 2020-02-14 NOTE — Telephone Encounter (Deleted)
She is under the care of [facility], wouldn't they be the office to order these?

## 2020-02-14 NOTE — Telephone Encounter (Signed)
Mr Daughtry notified we faxed an order to [facility].

## 2020-02-14 NOTE — Telephone Encounter (Signed)
Need to know what catheter type, size, amount.  Would be happy to send rx if needed.

## 2020-02-14 NOTE — Telephone Encounter (Signed)
Order printed out.  thx

## 2020-02-15 ENCOUNTER — Ambulatory Visit (INDEPENDENT_AMBULATORY_CARE_PROVIDER_SITE_OTHER): Payer: 59 | Admitting: Psychology

## 2020-02-15 DIAGNOSIS — F4310 Post-traumatic stress disorder, unspecified: Secondary | ICD-10-CM

## 2020-02-20 ENCOUNTER — Ambulatory Visit (INDEPENDENT_AMBULATORY_CARE_PROVIDER_SITE_OTHER): Payer: 59 | Admitting: Psychology

## 2020-02-20 ENCOUNTER — Ambulatory Visit: Payer: 59 | Admitting: Psychology

## 2020-02-20 DIAGNOSIS — F4310 Post-traumatic stress disorder, unspecified: Secondary | ICD-10-CM

## 2020-02-22 ENCOUNTER — Ambulatory Visit (INDEPENDENT_AMBULATORY_CARE_PROVIDER_SITE_OTHER): Payer: 59 | Admitting: Psychology

## 2020-02-22 ENCOUNTER — Other Ambulatory Visit: Payer: Self-pay | Admitting: Physical Medicine & Rehabilitation

## 2020-02-22 DIAGNOSIS — F4310 Post-traumatic stress disorder, unspecified: Secondary | ICD-10-CM

## 2020-02-27 ENCOUNTER — Other Ambulatory Visit: Payer: Self-pay

## 2020-02-27 DIAGNOSIS — M5416 Radiculopathy, lumbar region: Secondary | ICD-10-CM

## 2020-02-27 DIAGNOSIS — K592 Neurogenic bowel, not elsewhere classified: Secondary | ICD-10-CM

## 2020-02-27 DIAGNOSIS — S14105S Unspecified injury at C5 level of cervical spinal cord, sequela: Secondary | ICD-10-CM

## 2020-02-27 DIAGNOSIS — G8250 Quadriplegia, unspecified: Secondary | ICD-10-CM

## 2020-02-27 DIAGNOSIS — M4722 Other spondylosis with radiculopathy, cervical region: Secondary | ICD-10-CM

## 2020-02-27 MED ORDER — MORPHINE SULFATE 15 MG PO TABS: 15.0000 mg | ORAL_TABLET | Freq: Four times a day (QID) | ORAL | 0 refills | Status: DC | PRN

## 2020-02-27 NOTE — Telephone Encounter (Signed)
MS IR refilled, I cancelled the pending order as it didn't come up until I had written out the new order.

## 2020-02-28 ENCOUNTER — Other Ambulatory Visit: Payer: Self-pay | Admitting: Physical Medicine & Rehabilitation

## 2020-02-28 DIAGNOSIS — S14105S Unspecified injury at C5 level of cervical spinal cord, sequela: Secondary | ICD-10-CM

## 2020-02-28 DIAGNOSIS — G8250 Quadriplegia, unspecified: Secondary | ICD-10-CM

## 2020-02-29 ENCOUNTER — Ambulatory Visit (INDEPENDENT_AMBULATORY_CARE_PROVIDER_SITE_OTHER): Payer: 59 | Admitting: Psychology

## 2020-02-29 DIAGNOSIS — F4310 Post-traumatic stress disorder, unspecified: Secondary | ICD-10-CM | POA: Diagnosis not present

## 2020-03-05 ENCOUNTER — Ambulatory Visit: Payer: 59 | Admitting: Psychology

## 2020-03-05 ENCOUNTER — Ambulatory Visit (INDEPENDENT_AMBULATORY_CARE_PROVIDER_SITE_OTHER): Payer: 59 | Admitting: Psychology

## 2020-03-05 DIAGNOSIS — F4310 Post-traumatic stress disorder, unspecified: Secondary | ICD-10-CM | POA: Diagnosis not present

## 2020-03-07 ENCOUNTER — Ambulatory Visit (INDEPENDENT_AMBULATORY_CARE_PROVIDER_SITE_OTHER): Payer: 59 | Admitting: Psychology

## 2020-03-07 DIAGNOSIS — F4310 Post-traumatic stress disorder, unspecified: Secondary | ICD-10-CM | POA: Diagnosis not present

## 2020-03-08 HISTORY — PX: CERVICAL SPINE SURGERY: SHX589

## 2020-03-12 ENCOUNTER — Ambulatory Visit: Payer: Medicaid Other | Admitting: Psychology

## 2020-03-14 ENCOUNTER — Ambulatory Visit (INDEPENDENT_AMBULATORY_CARE_PROVIDER_SITE_OTHER): Payer: BC Managed Care – PPO | Admitting: Psychology

## 2020-03-14 ENCOUNTER — Other Ambulatory Visit: Payer: Self-pay | Admitting: Physical Medicine & Rehabilitation

## 2020-03-14 DIAGNOSIS — F4310 Post-traumatic stress disorder, unspecified: Secondary | ICD-10-CM

## 2020-03-14 DIAGNOSIS — S14105S Unspecified injury at C5 level of cervical spinal cord, sequela: Secondary | ICD-10-CM

## 2020-03-14 DIAGNOSIS — M4722 Other spondylosis with radiculopathy, cervical region: Secondary | ICD-10-CM

## 2020-03-14 DIAGNOSIS — G8250 Quadriplegia, unspecified: Secondary | ICD-10-CM

## 2020-03-14 DIAGNOSIS — M5416 Radiculopathy, lumbar region: Secondary | ICD-10-CM

## 2020-03-14 DIAGNOSIS — Z5181 Encounter for therapeutic drug level monitoring: Secondary | ICD-10-CM

## 2020-03-14 DIAGNOSIS — M4802 Spinal stenosis, cervical region: Secondary | ICD-10-CM

## 2020-03-19 ENCOUNTER — Ambulatory Visit: Payer: 59 | Admitting: Psychology

## 2020-03-19 ENCOUNTER — Ambulatory Visit: Payer: Medicaid Other | Admitting: Psychology

## 2020-03-21 ENCOUNTER — Ambulatory Visit (INDEPENDENT_AMBULATORY_CARE_PROVIDER_SITE_OTHER): Payer: BC Managed Care – PPO | Admitting: Psychology

## 2020-03-21 DIAGNOSIS — F4310 Post-traumatic stress disorder, unspecified: Secondary | ICD-10-CM | POA: Diagnosis not present

## 2020-03-26 ENCOUNTER — Ambulatory Visit: Payer: Medicaid Other | Admitting: Psychology

## 2020-03-26 ENCOUNTER — Other Ambulatory Visit: Payer: Self-pay | Admitting: Physical Medicine & Rehabilitation

## 2020-03-26 DIAGNOSIS — M4722 Other spondylosis with radiculopathy, cervical region: Secondary | ICD-10-CM

## 2020-03-26 DIAGNOSIS — G8250 Quadriplegia, unspecified: Secondary | ICD-10-CM

## 2020-03-26 DIAGNOSIS — S14105S Unspecified injury at C5 level of cervical spinal cord, sequela: Secondary | ICD-10-CM

## 2020-03-28 ENCOUNTER — Ambulatory Visit (INDEPENDENT_AMBULATORY_CARE_PROVIDER_SITE_OTHER): Payer: BC Managed Care – PPO | Admitting: Psychology

## 2020-03-28 ENCOUNTER — Other Ambulatory Visit: Payer: Self-pay

## 2020-03-28 DIAGNOSIS — G8250 Quadriplegia, unspecified: Secondary | ICD-10-CM

## 2020-03-28 DIAGNOSIS — K592 Neurogenic bowel, not elsewhere classified: Secondary | ICD-10-CM

## 2020-03-28 DIAGNOSIS — M4722 Other spondylosis with radiculopathy, cervical region: Secondary | ICD-10-CM

## 2020-03-28 DIAGNOSIS — F4310 Post-traumatic stress disorder, unspecified: Secondary | ICD-10-CM | POA: Diagnosis not present

## 2020-03-28 DIAGNOSIS — M5416 Radiculopathy, lumbar region: Secondary | ICD-10-CM

## 2020-03-28 DIAGNOSIS — S14105S Unspecified injury at C5 level of cervical spinal cord, sequela: Secondary | ICD-10-CM

## 2020-03-28 MED ORDER — MORPHINE SULFATE 15 MG PO TABS: 15.0000 mg | ORAL_TABLET | Freq: Four times a day (QID) | ORAL | 0 refills | Status: DC | PRN

## 2020-03-28 NOTE — Telephone Encounter (Signed)
Ms ir refilled

## 2020-04-02 ENCOUNTER — Ambulatory Visit (INDEPENDENT_AMBULATORY_CARE_PROVIDER_SITE_OTHER): Payer: BC Managed Care – PPO | Admitting: Psychology

## 2020-04-02 ENCOUNTER — Ambulatory Visit: Payer: Medicaid Other | Admitting: Psychology

## 2020-04-02 ENCOUNTER — Ambulatory Visit: Payer: 59 | Admitting: Psychology

## 2020-04-02 DIAGNOSIS — F4310 Post-traumatic stress disorder, unspecified: Secondary | ICD-10-CM | POA: Diagnosis not present

## 2020-04-04 ENCOUNTER — Ambulatory Visit (INDEPENDENT_AMBULATORY_CARE_PROVIDER_SITE_OTHER): Payer: BC Managed Care – PPO | Admitting: Psychology

## 2020-04-04 DIAGNOSIS — F4310 Post-traumatic stress disorder, unspecified: Secondary | ICD-10-CM | POA: Diagnosis not present

## 2020-04-09 ENCOUNTER — Ambulatory Visit: Payer: Medicaid Other | Admitting: Psychology

## 2020-04-10 ENCOUNTER — Encounter: Payer: Self-pay | Admitting: Physical Medicine & Rehabilitation

## 2020-04-10 ENCOUNTER — Encounter
Payer: BC Managed Care – PPO | Attending: Physical Medicine & Rehabilitation | Admitting: Physical Medicine & Rehabilitation

## 2020-04-10 ENCOUNTER — Other Ambulatory Visit: Payer: Self-pay

## 2020-04-10 VITALS — BP 109/78 | HR 109 | Temp 97.8°F | Ht 61.0 in

## 2020-04-10 DIAGNOSIS — K592 Neurogenic bowel, not elsewhere classified: Secondary | ICD-10-CM | POA: Insufficient documentation

## 2020-04-10 DIAGNOSIS — M5416 Radiculopathy, lumbar region: Secondary | ICD-10-CM | POA: Diagnosis present

## 2020-04-10 DIAGNOSIS — Z5181 Encounter for therapeutic drug level monitoring: Secondary | ICD-10-CM | POA: Insufficient documentation

## 2020-04-10 DIAGNOSIS — G8250 Quadriplegia, unspecified: Secondary | ICD-10-CM | POA: Diagnosis present

## 2020-04-10 MED ORDER — OXYCODONE HCL 10 MG PO TABS: 10.0000 mg | ORAL_TABLET | Freq: Four times a day (QID) | ORAL | 0 refills | Status: DC | PRN

## 2020-04-10 NOTE — Patient Instructions (Signed)
IF YOUR PAIN INCREASES THIS WEEK, YOU MAY TAKE ONE MORPHINE TABLET DURING THE DAY TO COVER IT.
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
## 2020-04-11 ENCOUNTER — Ambulatory Visit: Payer: Medicaid Other | Admitting: Psychology

## 2020-04-16 ENCOUNTER — Ambulatory Visit: Payer: Medicaid Other | Admitting: Psychology

## 2020-04-18 ENCOUNTER — Ambulatory Visit (INDEPENDENT_AMBULATORY_CARE_PROVIDER_SITE_OTHER): Payer: BC Managed Care – PPO | Admitting: Psychology

## 2020-04-18 DIAGNOSIS — F4310 Post-traumatic stress disorder, unspecified: Secondary | ICD-10-CM

## 2020-04-18 LAB — DRUG TOX MONITOR 1 W/CONF, ORAL FLD
Amphetamines: NEGATIVE ng/mL (ref <10)
Barbiturates: NEGATIVE ng/mL (ref <10)
Benzodiazepines: NEGATIVE ng/mL (ref <0.50)
Buprenorphine: NEGATIVE ng/mL (ref <0.10)
Cocaine: NEGATIVE ng/mL (ref <5.0)
Codeine: NEGATIVE ng/mL (ref <2.5)
Dihydrocodeine: NEGATIVE ng/mL (ref <2.5)
Fentanyl: NEGATIVE ng/mL (ref <0.10)
Heroin Metabolite: NEGATIVE ng/mL (ref <1.0)
Hydrocodone: NEGATIVE ng/mL (ref <2.5)
Hydromorphone: NEGATIVE ng/mL (ref <2.5)
MARIJUANA: NEGATIVE ng/mL (ref <2.5)
MDMA: NEGATIVE ng/mL (ref <10)
Meprobamate: NEGATIVE ng/mL (ref <2.5)
Methadone: NEGATIVE ng/mL (ref <5.0)
Morphine: NEGATIVE ng/mL (ref <2.5)
Nicotine Metabolite: NEGATIVE ng/mL (ref <5.0)
Norhydrocodone: NEGATIVE ng/mL (ref <2.5)
Noroxycodone: NEGATIVE ng/mL (ref <2.5)
Opiates: NEGATIVE ng/mL (ref <2.5)
Oxymorphone: NEGATIVE ng/mL (ref <2.5)
Phencyclidine: NEGATIVE ng/mL (ref <10)
Tapentadol: NEGATIVE ng/mL (ref <5.0)
Tramadol: NEGATIVE ng/mL (ref <5.0)
Zolpidem: NEGATIVE ng/mL (ref <5.0)

## 2020-04-18 LAB — DRUG TOX ALC METAB W/CON, ORAL FLD: Alcohol Metabolite: NEGATIVE ng/mL (ref <25)

## 2020-04-19 ENCOUNTER — Telehealth: Payer: Self-pay | Admitting: *Deleted

## 2020-04-19 NOTE — Telephone Encounter (Signed)
Oral swab drug screen failed collection due to insufficient saliva. Will need to be repeated. Due to paralysis urine test is not an option.

## 2020-04-23 ENCOUNTER — Ambulatory Visit: Payer: Medicaid Other | Admitting: Psychology

## 2020-04-25 ENCOUNTER — Ambulatory Visit (INDEPENDENT_AMBULATORY_CARE_PROVIDER_SITE_OTHER): Payer: BC Managed Care – PPO | Admitting: Psychology

## 2020-04-25 DIAGNOSIS — F4310 Post-traumatic stress disorder, unspecified: Secondary | ICD-10-CM | POA: Diagnosis not present

## 2020-04-30 ENCOUNTER — Ambulatory Visit: Payer: Medicaid Other | Admitting: Psychology

## 2020-05-07 ENCOUNTER — Ambulatory Visit: Payer: Medicaid Other | Admitting: Psychology

## 2020-05-08 MED ORDER — MORPHINE SULFATE 15 MG PO TABS
15.0000 mg | ORAL_TABLET | Freq: Four times a day (QID) | ORAL | 0 refills | Status: DC | PRN
Start: 2020-05-08 — End: 2020-06-06

## 2020-05-08 NOTE — Telephone Encounter (Signed)
We are dc'ing oxycodone and going back to [facility]. Will consider adding long-acting morphine at next visit

## 2020-05-09 ENCOUNTER — Ambulatory Visit: Payer: Medicaid Other | Admitting: Psychology

## 2020-05-14 ENCOUNTER — Ambulatory Visit: Payer: Medicaid Other | Admitting: Psychology

## 2020-05-16 ENCOUNTER — Ambulatory Visit (INDEPENDENT_AMBULATORY_CARE_PROVIDER_SITE_OTHER): Payer: BC Managed Care – PPO | Admitting: Psychology

## 2020-05-16 DIAGNOSIS — F4310 Post-traumatic stress disorder, unspecified: Secondary | ICD-10-CM

## 2020-05-21 ENCOUNTER — Ambulatory Visit: Payer: Medicaid Other | Admitting: Psychology

## 2020-05-23 ENCOUNTER — Ambulatory Visit (INDEPENDENT_AMBULATORY_CARE_PROVIDER_SITE_OTHER): Payer: BC Managed Care – PPO | Admitting: Psychology

## 2020-05-23 DIAGNOSIS — F4310 Post-traumatic stress disorder, unspecified: Secondary | ICD-10-CM

## 2020-05-28 ENCOUNTER — Ambulatory Visit: Payer: Medicaid Other | Admitting: Psychology

## 2020-05-30 ENCOUNTER — Ambulatory Visit: Payer: BC Managed Care – PPO | Admitting: Psychology

## 2020-06-03 ENCOUNTER — Other Ambulatory Visit: Payer: Self-pay | Admitting: Physical Medicine & Rehabilitation

## 2020-06-03 NOTE — Telephone Encounter (Signed)
Per Dr Riley Kill last two notes, directions are 0.5 mg clonazepam tid.

## 2020-06-04 ENCOUNTER — Ambulatory Visit: Payer: Medicaid Other | Admitting: Psychology

## 2020-06-06 ENCOUNTER — Other Ambulatory Visit: Payer: Self-pay

## 2020-06-06 ENCOUNTER — Ambulatory Visit: Payer: BC Managed Care – PPO | Admitting: Psychology

## 2020-06-06 MED ORDER — MORPHINE SULFATE 15 MG PO TABS: 15.0000 mg | ORAL_TABLET | Freq: Four times a day (QID) | ORAL | 0 refills | Status: DC | PRN

## 2020-06-09 ENCOUNTER — Other Ambulatory Visit: Payer: Self-pay

## 2020-06-10 ENCOUNTER — Other Ambulatory Visit: Payer: Self-pay

## 2020-06-10 ENCOUNTER — Telehealth: Payer: Self-pay | Admitting: *Deleted

## 2020-06-10 MED ORDER — MORPHINE SULFATE 15 MG PO TABS: 15.0000 mg | ORAL_TABLET | Freq: Four times a day (QID) | ORAL | 0 refills | Status: DC | PRN

## 2020-06-10 NOTE — Telephone Encounter (Signed)
Refill sent in

## 2020-06-10 NOTE — Telephone Encounter (Signed)
Please reorder the Morphine IR, the rx was on PRINT and pharmacy did not receive.

## 2020-06-12 ENCOUNTER — Encounter: Payer: Self-pay | Admitting: Physical Medicine & Rehabilitation

## 2020-06-12 ENCOUNTER — Encounter
Payer: BC Managed Care – PPO | Attending: Physical Medicine & Rehabilitation | Admitting: Physical Medicine & Rehabilitation

## 2020-06-12 ENCOUNTER — Other Ambulatory Visit: Payer: Self-pay

## 2020-06-12 VITALS — BP 108/75 | HR 102 | Temp 98.4°F | Ht 61.0 in

## 2020-06-12 DIAGNOSIS — S14105S Unspecified injury at C5 level of cervical spinal cord, sequela: Secondary | ICD-10-CM | POA: Insufficient documentation

## 2020-06-12 DIAGNOSIS — G8250 Quadriplegia, unspecified: Secondary | ICD-10-CM | POA: Diagnosis not present

## 2020-06-12 MED ORDER — BACLOFEN 10 MG PO TABS
10.0000 mg | ORAL_TABLET | Freq: Four times a day (QID) | ORAL | 3 refills | Status: AC
Start: 2020-06-12 — End: ?

## 2020-06-12 MED ORDER — MORPHINE SULFATE ER 15 MG PO TBCR: 15.0000 mg | EXTENDED_RELEASE_TABLET | Freq: Two times a day (BID) | ORAL | 0 refills | Status: AC

## 2020-06-12 MED ORDER — MORPHINE SULFATE ER 15 MG PO TBCR: 15.0000 mg | EXTENDED_RELEASE_TABLET | Freq: Two times a day (BID) | ORAL | 0 refills | Status: DC

## 2020-06-12 NOTE — Progress Notes (Signed)
Botox Injection for spasticity of upper extremity using needle EMG guidance Indication: Spastic tetraplegia (HCC) - Plan: Ambulatory referral to Home Health, baclofen (LIORESAL) 10 MG tablet, morphine (MS CONTIN) 15 MG 12 hr tablet, DISCONTINUED: morphine (MS CONTIN) 15 MG 12 hr tablet  Spinal cord injury at C5-C7 level without injury of spinal bone, sequela (HCC) - Plan: baclofen (LIORESAL) 10 MG tablet, morphine (MS CONTIN) 15 MG 12 hr tablet, DISCONTINUED: morphine (MS CONTIN) 15 MG 12 hr tablet G82.50  Dilution: 100 Units/ml        Total Units Injected: 500 Indication: Severe spasticity which interferes with ADL,mobility and/or  hygiene and is unresponsive to medication management and other conservative care Informed consent was obtained after describing risks and benefits of the procedure with the patient. This includes bleeding, bruising, infection, excessive weakness, or medication side effects. A REMS form is on file and signed.  Needle: 85mm injectable monopolar needle electrode    Number of units per muscle Pectoralis Major 0 units Pectoralis Minor 0 units Biceps 0 units Brachioradialis 0 units FCR 25 units right, 50 units left FCU 25 units right, 50 units left FDS 50 units right, 100 units left FDP 50 units right, 100 units left FPL - units Palmaris Longus - units Pronator Teres - units Pronator Quadratus - units Lumbricals - units  50u discarded All injections were done after obtaining appropriate EMG activity and after negative drawback for blood. The patient tolerated the procedure well. Post procedure instructions were given. Return in about 1 month (around 07/13/2020).  We decreased baclofen to 10mg  q6. Dicy can try to slowly wean to off given that she's having a good response now with the high dose baclofen pump rate.   Will begin a trial of mscontin 89mq q12 for better pain control.   Reviewed her most recent lumbar spine film which is not disimilar from her prior  film here. l4-5 disc and multi-level facet disease  F/u in 2 mos

## 2020-06-12 NOTE — Patient Instructions (Signed)
PLEASE FEEL FREE TO CALL OUR OFFICE WITH ANY PROBLEMS OR QUESTIONS ([phone])      

## 2020-06-13 ENCOUNTER — Ambulatory Visit: Payer: BC Managed Care – PPO | Admitting: Psychology

## 2020-06-14 ENCOUNTER — Ambulatory Visit (INDEPENDENT_AMBULATORY_CARE_PROVIDER_SITE_OTHER): Payer: BC Managed Care – PPO | Admitting: Psychology

## 2020-06-14 DIAGNOSIS — F4310 Post-traumatic stress disorder, unspecified: Secondary | ICD-10-CM

## 2020-06-20 ENCOUNTER — Ambulatory Visit (INDEPENDENT_AMBULATORY_CARE_PROVIDER_SITE_OTHER): Payer: BC Managed Care – PPO | Admitting: Psychology

## 2020-06-20 DIAGNOSIS — F4310 Post-traumatic stress disorder, unspecified: Secondary | ICD-10-CM | POA: Diagnosis not present

## 2020-06-27 ENCOUNTER — Ambulatory Visit (INDEPENDENT_AMBULATORY_CARE_PROVIDER_SITE_OTHER): Payer: BC Managed Care – PPO | Admitting: Psychology

## 2020-06-27 DIAGNOSIS — F4310 Post-traumatic stress disorder, unspecified: Secondary | ICD-10-CM

## 2020-07-04 ENCOUNTER — Ambulatory Visit (INDEPENDENT_AMBULATORY_CARE_PROVIDER_SITE_OTHER): Payer: BC Managed Care – PPO | Admitting: Psychology

## 2020-07-04 DIAGNOSIS — F4310 Post-traumatic stress disorder, unspecified: Secondary | ICD-10-CM

## 2020-07-09 ENCOUNTER — Telehealth: Payer: Self-pay | Admitting: Registered Nurse

## 2020-07-09 ENCOUNTER — Other Ambulatory Visit: Payer: Self-pay

## 2020-07-09 MED ORDER — MORPHINE SULFATE 15 MG PO TABS: 15.0000 mg | ORAL_TABLET | Freq: Four times a day (QID) | ORAL | 0 refills | Status: DC | PRN

## 2020-07-09 NOTE — Telephone Encounter (Signed)
PMP was Reviewed, MSIR e- scribed. Ms. Elford, is aware via My-Chart message.

## 2020-07-11 ENCOUNTER — Ambulatory Visit: Payer: BC Managed Care – PPO | Admitting: Psychology

## 2020-07-12 DIAGNOSIS — G8250 Quadriplegia, unspecified: Secondary | ICD-10-CM

## 2020-07-12 NOTE — Telephone Encounter (Signed)
Hospice has called and would like to know if he would be able to due hospice attending. Please call back [phone] [facility]

## 2020-07-15 ENCOUNTER — Telehealth: Payer: Self-pay | Admitting: *Deleted

## 2020-07-15 NOTE — Telephone Encounter (Signed)
Patients husband called asking for an update on hospice referral and to report patients behavior over the weekend. I spoke with Jasmine [name] over the phone. Brief synopsis goes like this: Patient is having extreme uncontrolled pain primarily in her trunk. Jasmine [name] reports that her oxygen levels are taking wild dips down into the 70% range.  He states patient is confused. Asking to be cathed when she was just cathed, asking for medication right after medication was already given.  He states she is lashing out in anger at him and other family members who are now present. She told Jasmine [name] that the angel has reached out to her and she has seen the light. During the call, patients husband choked up holding back tears.  He admits he does not know what to do and is frightened of whats to come. I informed him that Dr. Riley Kill has put in an order for palliative care.

## 2020-07-15 NOTE — Telephone Encounter (Signed)
Spoke with Civil engineer, contracting. Referral made for Palliative Care

## 2020-07-16 NOTE — Telephone Encounter (Signed)
I spoke with Jasmine [name] and asked him to hold her MS Contin CR. Spoke to him about hospice referral.  thanks

## 2020-07-17 ENCOUNTER — Encounter: Payer: BC Managed Care – PPO | Admitting: Physical Medicine & Rehabilitation

## 2020-07-18 ENCOUNTER — Ambulatory Visit: Payer: BC Managed Care – PPO | Admitting: Psychology

## 2020-07-25 ENCOUNTER — Ambulatory Visit (INDEPENDENT_AMBULATORY_CARE_PROVIDER_SITE_OTHER): Payer: BC Managed Care – PPO | Admitting: Psychology

## 2020-07-25 DIAGNOSIS — F4310 Post-traumatic stress disorder, unspecified: Secondary | ICD-10-CM

## 2020-07-30 ENCOUNTER — Telehealth: Payer: Self-pay | Admitting: Nurse Practitioner

## 2020-07-30 NOTE — Telephone Encounter (Signed)
Spoke with patient's husband, Apolinar Junes, regarding the Palliative referral and he was in agreement with scheduling visit.  I have scheduled an In-home Consult for 08/01/20 @ 8:30 AM.

## 2020-08-01 ENCOUNTER — Encounter: Payer: Self-pay | Admitting: Nurse Practitioner

## 2020-08-01 ENCOUNTER — Ambulatory Visit: Payer: BC Managed Care – PPO | Admitting: Psychology

## 2020-08-01 ENCOUNTER — Other Ambulatory Visit: Payer: Medicaid Other | Admitting: Nurse Practitioner

## 2020-08-01 DIAGNOSIS — G8250 Quadriplegia, unspecified: Secondary | ICD-10-CM

## 2020-08-01 DIAGNOSIS — Z515 Encounter for palliative care: Secondary | ICD-10-CM
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
## 2020-08-05 ENCOUNTER — Other Ambulatory Visit: Payer: Self-pay

## 2020-08-05 ENCOUNTER — Telehealth: Payer: Self-pay | Admitting: Nurse Practitioner

## 2020-08-05 MED ORDER — MORPHINE SULFATE 15 MG PO TABS
15.0000 mg | ORAL_TABLET | Freq: Four times a day (QID) | ORAL | 0 refills | Status: AC | PRN
Start: 2020-08-05 — End: ?

## 2020-08-05 NOTE — Telephone Encounter (Signed)
I called Mr. Brandau concerning follow up Palliative care visit and pending medications review. Discussed with Mr. Sporrer call made to Dr. Merlinda Frederick concerning 90 day supply of medication so it can be in the home prior to Hospice. We talked about medications. We talked about Hospice Services that would be provided. We talked about follow up Palliative care visit scheduled to review medications and ensure all is in place prior to Hospice. Appointment scheduled. Questions answered. Emotional support provided.  Total time spent 20 minutes  Documentation 5 minutes  Phone discussion 15 minutes

## 2020-08-07 ENCOUNTER — Other Ambulatory Visit: Payer: Medicaid Other | Admitting: Nurse Practitioner

## 2020-08-07 ENCOUNTER — Encounter: Payer: Self-pay | Admitting: Nurse Practitioner

## 2020-08-07 ENCOUNTER — Other Ambulatory Visit: Payer: Self-pay

## 2020-08-07 DIAGNOSIS — G8250 Quadriplegia, unspecified: Secondary | ICD-10-CM

## 2020-08-07 DIAGNOSIS — Z515 Encounter for palliative care: Secondary | ICD-10-CM
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
## 2020-08-08 ENCOUNTER — Ambulatory Visit (INDEPENDENT_AMBULATORY_CARE_PROVIDER_SITE_OTHER): Payer: BC Managed Care – PPO | Admitting: Psychology

## 2020-08-08 DIAGNOSIS — F4310 Post-traumatic stress disorder, unspecified: Secondary | ICD-10-CM | POA: Diagnosis not present

## 2020-08-09 ENCOUNTER — Telehealth: Payer: Self-pay | Admitting: Nurse Practitioner

## 2020-08-09 NOTE — Telephone Encounter (Signed)
Mr. Lottman called, endorses Mrs. Etcheverry is now not able to swallow, unable to get her medications in. Mrs. Boehning moaning, decrease responsiveness. Has not eaten or drank today. We talked about medical goals of care for DNR, comfort at home. Mr. Perdue endorses wishes are to pass at home. Discussed will call to see if Hospice would be able to do an Emergent Admission visit. I called Dr Jamie Brookes, updated on case. Advised to call Hospice triage nurse to see if RN available. I called Josefine Class RN, Hospice triage. We discussed case. Kendal Hymen checked and arranged Emergent AV visit. Discussed with Dr Jamie Brookes will hold all medications as unable to swallow. Will send in roxanol for pain, haldol for restlessness, delusions. I called Mr. Ayuso, updated on plan, in agreement.   Rx: Roxanol 10mg  (0.62ml) q4hrs as needed for pain; dyspnea; #95ml; No RF; [facility]rib[facility]  Rx: Haldol 5mg  q4hrs as needed for restlessness; moaning; #40; No RF; esc[facility]ed [facility]  Rx: Tylenol 160mg /74ml qid; #216ml; No RF

## 2020-08-15 ENCOUNTER — Ambulatory Visit: Payer: BC Managed Care – PPO | Admitting: Psychology

## 2020-08-22 ENCOUNTER — Ambulatory Visit: Payer: BC Managed Care – PPO | Admitting: Psychology

## 2020-08-29 ENCOUNTER — Ambulatory Visit: Payer: BC Managed Care – PPO | Admitting: Psychology

## 2020-09-05 ENCOUNTER — Ambulatory Visit: Payer: BC Managed Care – PPO | Admitting: Psychology

## 2020-09-12 ENCOUNTER — Ambulatory Visit: Payer: BC Managed Care – PPO | Admitting: Psychology

## 2020-09-18 ENCOUNTER — Ambulatory Visit: Payer: BC Managed Care – PPO | Admitting: Physical Medicine & Rehabilitation

## 2020-09-19 ENCOUNTER — Ambulatory Visit: Payer: BC Managed Care – PPO | Admitting: Psychology

## 2020-09-26 ENCOUNTER — Ambulatory Visit: Payer: BC Managed Care – PPO | Admitting: Psychology
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
## 2020-10-03 ENCOUNTER — Ambulatory Visit: Payer: BC Managed Care – PPO | Admitting: Psychology

## 2020-10-10 ENCOUNTER — Ambulatory Visit: Payer: BC Managed Care – PPO | Admitting: Psychology

## 2020-10-17 ENCOUNTER — Ambulatory Visit: Payer: BC Managed Care – PPO | Admitting: Psychology

## 2020-10-24 ENCOUNTER — Ambulatory Visit: Payer: BC Managed Care – PPO | Admitting: Psychology
